# Patient Record
Sex: Female | Born: 1940 | Race: White | Hispanic: No | Marital: Married | Smoking: Never smoker
Health system: Southern US, Community
[De-identification: ages and names within clinical notes are randomized; demographics above are authoritative.]

## PROBLEM LIST (undated history)

## (undated) DIAGNOSIS — F419 Anxiety disorder, unspecified: Secondary | ICD-10-CM

## (undated) DIAGNOSIS — H269 Unspecified cataract: Secondary | ICD-10-CM

## (undated) DIAGNOSIS — T7840XA Allergy, unspecified, initial encounter: Secondary | ICD-10-CM

## (undated) DIAGNOSIS — E785 Hyperlipidemia, unspecified: Secondary | ICD-10-CM

## (undated) DIAGNOSIS — F329 Major depressive disorder, single episode, unspecified: Secondary | ICD-10-CM

## (undated) DIAGNOSIS — I10 Essential (primary) hypertension: Secondary | ICD-10-CM

## (undated) DIAGNOSIS — F32A Depression, unspecified: Secondary | ICD-10-CM

## (undated) HISTORY — DX: Major depressive disorder, single episode, unspecified: F32.9

## (undated) HISTORY — DX: Hyperlipidemia, unspecified: E78.5

## (undated) HISTORY — DX: Depression, unspecified: F32.A

## (undated) HISTORY — DX: Unspecified cataract: H26.9

## (undated) HISTORY — DX: Essential (primary) hypertension: I10

## (undated) HISTORY — DX: Anxiety disorder, unspecified: F41.9

## (undated) HISTORY — PX: DENTAL SURGERY: SHX609

## (undated) HISTORY — PX: APPENDECTOMY: SHX54

## (undated) HISTORY — DX: Allergy, unspecified, initial encounter: T78.40XA

## (undated) HISTORY — PX: BREAST SURGERY: SHX581

## (undated) HISTORY — PX: TONSILLECTOMY AND ADENOIDECTOMY: SUR1326

## (undated) HISTORY — PX: ABDOMINAL HYSTERECTOMY: SHX81

---

## 1998-02-16 ENCOUNTER — Other Ambulatory Visit: Admission: RE | Admit: 1998-02-16 | Discharge: 1998-02-16 | Payer: Self-pay | Admitting: Obstetrics and Gynecology

## 1998-02-21 ENCOUNTER — Other Ambulatory Visit: Admission: RE | Admit: 1998-02-21 | Discharge: 1998-02-21 | Payer: Self-pay | Admitting: Radiology

## 1998-03-20 ENCOUNTER — Other Ambulatory Visit: Admission: RE | Admit: 1998-03-20 | Discharge: 1998-03-20 | Payer: Self-pay

## 1999-02-19 ENCOUNTER — Other Ambulatory Visit: Admission: RE | Admit: 1999-02-19 | Discharge: 1999-02-19 | Payer: Self-pay | Admitting: Obstetrics and Gynecology

## 1999-08-01 ENCOUNTER — Encounter: Admission: RE | Admit: 1999-08-01 | Discharge: 1999-08-01 | Payer: Self-pay | Admitting: Internal Medicine

## 1999-08-01 ENCOUNTER — Encounter: Payer: Self-pay | Admitting: Internal Medicine

## 2000-02-25 ENCOUNTER — Other Ambulatory Visit: Admission: RE | Admit: 2000-02-25 | Discharge: 2000-02-25 | Payer: Self-pay | Admitting: Obstetrics and Gynecology

## 2001-03-02 ENCOUNTER — Other Ambulatory Visit: Admission: RE | Admit: 2001-03-02 | Discharge: 2001-03-02 | Payer: Self-pay | Admitting: Obstetrics and Gynecology

## 2002-01-18 ENCOUNTER — Ambulatory Visit (HOSPITAL_COMMUNITY): Admission: RE | Admit: 2002-01-18 | Discharge: 2002-01-18 | Payer: Self-pay | Admitting: Gastroenterology

## 2002-04-05 ENCOUNTER — Other Ambulatory Visit: Admission: RE | Admit: 2002-04-05 | Discharge: 2002-04-05 | Payer: Self-pay | Admitting: Obstetrics and Gynecology

## 2003-04-07 ENCOUNTER — Other Ambulatory Visit: Admission: RE | Admit: 2003-04-07 | Discharge: 2003-04-07 | Payer: Self-pay | Admitting: Obstetrics and Gynecology

## 2004-04-19 ENCOUNTER — Other Ambulatory Visit: Admission: RE | Admit: 2004-04-19 | Discharge: 2004-04-19 | Payer: Self-pay | Admitting: Obstetrics and Gynecology

## 2005-04-25 ENCOUNTER — Other Ambulatory Visit: Admission: RE | Admit: 2005-04-25 | Discharge: 2005-04-25 | Payer: Self-pay | Admitting: Obstetrics and Gynecology

## 2006-04-28 ENCOUNTER — Other Ambulatory Visit: Admission: RE | Admit: 2006-04-28 | Discharge: 2006-04-28 | Payer: Self-pay | Admitting: Obstetrics and Gynecology

## 2009-02-27 ENCOUNTER — Ambulatory Visit: Payer: Self-pay | Admitting: Surgery

## 2010-08-14 NOTE — Procedures (Signed)
CAROTID DUPLEX EXAM   INDICATION:  Carotid bruit, known tortuous vessels per patient that have  been followed for years.   HISTORY:  Diabetes:  No  Cardiac:  No  Hypertension:  No  Smoking:  No  Previous Surgery:  No  CV History:  Asymptomatic  Amaurosis Fugax No, Paresthesias No, Hemiparesis No                                       RIGHT             LEFT  Brachial systolic pressure:         154               158  Brachial Doppler waveforms:         WNL               WNL  Vertebral direction of flow:        Antegrade         Antegrade  DUPLEX VELOCITIES (cm/sec)  CCA peak systolic                   121               125  ECA peak systolic                   107               87  ICA peak systolic                   P=104, D=255      P=91, D=252  ICA end diastolic                   P=37, D=72        P=26, D=72  PLAQUE MORPHOLOGY:                  Homogenous  PLAQUE AMOUNT:                      Mild              None visualized  PLAQUE LOCATION:                    Bifurcation   IMPRESSION:  Bilateral internal carotid artery elevated velocities  mid/distal suggestive of 60%-79% stenosis, however, no plaque visualized  in these regions.  Significant tortuosity noted in bilateral mid/distal  internal carotid arteries which may be cause of elevated velocities.        ___________________________________________  V. Charlena Cross, MD   AS/MEDQ  D:  02/27/2009  T:  02/28/2009  Job:  [id number]

## 2010-08-17 NOTE — Op Note (Signed)
NAME:  Stephanie Aguilar, Stephanie Aguilar [facility]                 ACCOUNT NO.:  [id number]   MEDICAL RECORD NO.:  [id number]                   PATIENT TYPE:  AMB   LOCATION:  ENDO                                 FACILITY:  [facility]   PHYSICIAN:  Danise Edge, M.D.                DATE OF BIRTH:  [birth year]   DATE OF PROCEDURE:  01/18/2002  DATE OF DISCHARGE:                                 OPERATIVE REPORT   PROCEDURE:  Screening colonoscopy.   INDICATIONS FOR PROCEDURE:  [name] is a 70 year old female born  1940-04-19. [name] is scheduled to undergo her first screening  colonoscopy with polypectomy to prevent colon cancer. [name] father  underwent colonoscopic exams to remove colon polyps. [name]  intermittently passes blood on the toilet tissue if she wipes too  vigorously. Her stool Hemoccult cards performed through her gynecologist  have been negative throughout the years.   I discussed with [name] the complications associated with colonoscopy  and polypectomy including a 15/1000 risk of bleeding and 06/998 risk of  colon perforation requiring surgical repair. [name] has signed the  operative permit.   MEDICATION ALLERGIES:  SULFA DRUGS, ERYTHROMYCIN, DARVON, LATEX, CODEINE.   CHRONIC MEDICATIONS:  1. Remeron.  2. Lorazepam.  3. Estrogen.  4. Vitamin C.  5. Calcium with vitamin D.  6. Clarinex.  7. Flonase.  8. Advil.  9. Pravachol.   PAST MEDICAL HISTORY:  Hypercholesterolemia, rosacea.   PAST SURGICAL HISTORY:  Hysterectomy, breast fibroadenomas removed,  tonsillectomy, appendectomy.   HABITS:  [name] does not smoke cigarettes or consume alcohol.   ENDOSCOPIST:  Charolett Bumpers, M.D.   PREMEDICATION:  Versed 10 mg, fentanyl 100 mcg.   ENDOSCOPE:  Olympus pediatric colonoscope.   DESCRIPTION OF PROCEDURE:  After obtaining informed consent, [name]  was  placed in the left lateral decubitus position. I administered intravenous  Versed and intravenous fentanyl to achieve conscious sedation for the  procedure. The patient's blood pressure, oxygen saturation and cardiac  rhythm were monitored throughout the procedure and documented in the medical  record.   Anal inspection was normal. Digital rectal exam was normal. The Olympus  pediatric video colonoscope was introduced into the rectum and advanced to  the cecum. Colonic preparation for the exam today was excellent.   RECTUM:  Normal.   SIGMOID COLON AND DESCENDING COLON:  Normal.   SPLENIC FLEXURE:  Normal.   TRANSVERSE COLON:  Normal.   HEPATIC FLEXURE:  Normal.   ASCENDING COLON:  Normal.   CECUM AND ILEOCECAL VALVE:  Normal.   ASSESSMENT:  Normal screening proctocolonoscopy to the cecum. No endoscopic  evidence for the presence of colorectal neoplasia.  Danise Edge, M.D.    MJ/MEDQ  D:  01/18/2002  T:  01/18/2002  Job:  [id number]   cc:   Brett Canales A. Cleta Alberts, M.D.   Darius Bump, M.D.

## 2011-05-15 DIAGNOSIS — Z1231 Encounter for screening mammogram for malignant neoplasm of breast: Secondary | ICD-10-CM | POA: Diagnosis not present

## 2011-05-29 DIAGNOSIS — Z01419 Encounter for gynecological examination (general) (routine) without abnormal findings: Secondary | ICD-10-CM | POA: Diagnosis not present

## 2011-05-29 DIAGNOSIS — Z124 Encounter for screening for malignant neoplasm of cervix: Secondary | ICD-10-CM | POA: Diagnosis not present

## 2011-07-10 DIAGNOSIS — I10 Essential (primary) hypertension: Secondary | ICD-10-CM | POA: Diagnosis not present

## 2011-07-10 DIAGNOSIS — R0989 Other specified symptoms and signs involving the circulatory and respiratory systems: Secondary | ICD-10-CM | POA: Diagnosis not present

## 2011-07-10 DIAGNOSIS — E7800 Pure hypercholesterolemia, unspecified: Secondary | ICD-10-CM | POA: Diagnosis not present

## 2011-08-06 ENCOUNTER — Ambulatory Visit (INDEPENDENT_AMBULATORY_CARE_PROVIDER_SITE_OTHER): Payer: Medicare Other | Admitting: Emergency Medicine

## 2011-08-06 ENCOUNTER — Encounter: Payer: Self-pay | Admitting: Emergency Medicine

## 2011-08-06 VITALS — BP 128/66 | HR 57 | Temp 98.0°F | Resp 16 | Ht 59.5 in | Wt 114.0 lb

## 2011-08-06 DIAGNOSIS — I10 Essential (primary) hypertension: Secondary | ICD-10-CM

## 2011-08-06 DIAGNOSIS — E785 Hyperlipidemia, unspecified: Secondary | ICD-10-CM | POA: Diagnosis not present

## 2011-08-06 DIAGNOSIS — F411 Generalized anxiety disorder: Secondary | ICD-10-CM | POA: Diagnosis not present

## 2011-08-06 LAB — LIPID PANEL
HDL: 45 mg/dL (ref >39)
LDL Cholesterol: 67 mg/dL (ref 0–99)
Total CHOL/HDL Ratio: 3.3 Ratio
VLDL: 35 mg/dL (ref 0–40)

## 2011-08-06 LAB — CBC WITH DIFFERENTIAL/PLATELET
Basophils Absolute: 0.1 10*3/uL (ref 0.0–0.1)
Basophils Relative: 1 % (ref 0–1)
Eosinophils Absolute: 0.3 10*3/uL (ref 0.0–0.7)
Eosinophils Relative: 4 % (ref 0–5)
HCT: 43.2 % (ref 36.0–46.0)
Hemoglobin: 14.1 g/dL (ref 12.0–15.0)
Lymphocytes Relative: 25 % (ref 12–46)
Lymphs Abs: 1.6 10*3/uL (ref 0.7–4.0)
MCH: 30.3 pg (ref 26.0–34.0)
MCHC: 32.6 g/dL (ref 30.0–36.0)
MCV: 92.9 fL (ref 78.0–100.0)
Monocytes Absolute: 0.5 10*3/uL (ref 0.1–1.0)
Monocytes Relative: 8 % (ref 3–12)
Neutro Abs: 4.1 10*3/uL (ref 1.7–7.7)
Neutrophils Relative %: 62 % (ref 43–77)
Platelets: 178 10*3/uL (ref 150–400)
RBC: 4.65 MIL/uL (ref 3.87–5.11)
RDW: 13.2 % (ref 11.5–15.5)
WBC: 6.6 10*3/uL (ref 4.0–10.5)

## 2011-08-06 MED ORDER — VALSARTAN 320 MG PO TABS: 320.0000 mg | ORAL_TABLET | Freq: Every day | ORAL | Status: DC

## 2011-08-06 NOTE — Progress Notes (Signed)
  Subjective:    Patient ID: Stephanie Aguilar, female    DOB: 1940/08/21, 71 y.o.   MRN: [id number]  HPI patient enters for followup allergies high blood pressure and high cholesterol. She has just returned from a trip to the beach. He is not complaining of any chest pain shortness of breath or any other problems at the present time.    Review of Systems noncontributory except as relates to this illness.     Objective:   Physical Exam HEENT exam carotid bruits present which are unchanged from previous. Chest is clear heart regular rate no murmurs blood pressure is controlled .        Assessment & Plan:  Will go ahead and check her cholesterol. Diovan was refill recheck 6 months physical at that time.

## 2011-11-21 ENCOUNTER — Ambulatory Visit (INDEPENDENT_AMBULATORY_CARE_PROVIDER_SITE_OTHER): Payer: Medicare Other | Admitting: Emergency Medicine

## 2011-11-21 VITALS — BP 110/70 | HR 55 | Temp 98.1°F | Resp 16 | Ht 59.5 in | Wt 112.0 lb

## 2011-11-21 DIAGNOSIS — R197 Diarrhea, unspecified: Secondary | ICD-10-CM | POA: Diagnosis not present

## 2011-11-21 LAB — POCT CBC
Granulocyte percent: 70.1 %G (ref 37–80)
MCH, POC: 29.6 pg (ref 27–31.2)
MID (cbc): 0.8 (ref 0–0.9)
MPV: 9.7 fL (ref 0–99.8)
POC Granulocyte: 6.9 (ref 2–6.9)
POC MID %: 8.3 %M (ref 0–12)
Platelet Count, POC: 231 10*3/uL (ref 142–424)
RBC: 5 M/uL (ref 4.04–5.48)

## 2011-11-21 LAB — IFOBT (OCCULT BLOOD): IFOBT: NEGATIVE

## 2011-11-21 LAB — POCT SEDIMENTATION RATE: POCT SED RATE: 6 mm/hr (ref 0–22)

## 2011-11-21 MED ORDER — DICYCLOMINE HCL 20 MG PO TABS: 20.0000 mg | ORAL_TABLET | Freq: Three times a day (TID) | ORAL | Status: DC

## 2011-11-21 NOTE — Progress Notes (Signed)
  Subjective:    Patient ID: Stephanie Aguilar, female    DOB: 20-Aug-1940, 71 y.o.   MRN: [id number]  HPI Pt presents to clinic today with complaints of diarrhea for months/years. She thinks it may be related to her Pravachol which she has changed her dosages around a little bit. This has not seemed to make a difference. Pt also states the diarrhea may be related to stress. She has been busy this summer traveling and visiting friends and family.    Review of Systems     Objective:   Physical Exam is no scleral icterus. Neck is supple chest clear heart regular rate no murmurs abdomen soft nontender except for an area deep in the right lower abdomen.        Assessment & Plan:  I gave her some Bentyl she can try she did not want to take this medicine she could try Imodium A-D and modify her diet. She is going to see GI because she is due for her colonoscopy anyway. Her CBC and hemmosure were okay.

## 2011-11-21 NOTE — Patient Instructions (Addendum)
Diarrhea Infections caused by germs (bacterial) or a virus commonly cause diarrhea. Your caregiver has determined that with time, rest and fluids, the diarrhea should improve. In general, eat normally while drinking more water than usual. Although water may prevent dehydration, it does not contain salt and minerals (electrolytes). Broths, weak tea without caffeine and oral rehydration solutions (ORS) replace fluids and electrolytes. Small amounts of fluids should be taken frequently. Large amounts at one time may not be tolerated. Plain water may be harmful in infants and the elderly. Oral rehydrating solutions (ORS) are available at pharmacies and grocery stores. ORS replace water and important electrolytes in proper proportions. Sports drinks are not as effective as ORS and may be harmful due to sugars worsening diarrhea.  ORS is especially recommended for use in children with diarrhea. As a general guideline for children, replace any new fluid losses from diarrhea and/or vomiting with ORS as follows:   If your child weighs 22 pounds or under (10 kg or less), give 60-120 mL ( -  cup or 2 - 4 ounces) of ORS for each episode of diarrheal stool or vomiting episode.   If your child weighs more than 22 pounds (more than 10 kgs), give 120-240 mL ( - 1 cup or 4 - 8 ounces) of ORS for each diarrheal stool or episode of vomiting.   While correcting for dehydration, children should eat normally. However, foods high in sugar should be avoided because this may worsen diarrhea. Large amounts of carbonated soft drinks, juice, gelatin desserts and other highly sugared drinks should be avoided.   After correction of dehydration, other liquids that are appealing to the child may be added. Children should drink small amounts of fluids frequently and fluids should be increased as tolerated. Children should drink enough fluids to keep urine clear or pale yellow.   Adults should eat normally while drinking more fluids  than usual. Drink small amounts of fluids frequently and increase as tolerated. Drink enough fluids to keep urine clear or pale yellow. Broths, weak decaffeinated tea, lemon lime soft drinks (allowed to go flat) and ORS replace fluids and electrolytes.   Avoid:   Carbonated drinks.   Juice.   Extremely hot or cold fluids.   Caffeine drinks.   Fatty, greasy foods.   Alcohol.   Tobacco.   Too much intake of anything at one time.   Gelatin desserts.   Probiotics are active cultures of beneficial bacteria. They may lessen the amount and number of diarrheal stools in adults. Probiotics can be found in yogurt with active cultures and in supplements.   Wash hands well to avoid spreading bacteria and virus.   Anti-diarrheal medications are not recommended for infants and children.   Only take over-the-counter or prescription medicines for pain, discomfort or fever as directed by your caregiver. Do not give aspirin to children because it may cause Reye's Syndrome.   For adults, ask your caregiver if you should continue all prescribed and over-the-counter medicines.   If your caregiver has given you a follow-up appointment, it is very important to keep that appointment. Not keeping the appointment could result in a chronic or permanent injury, and disability. If there is any problem keeping the appointment, you must call back to this facility for assistance.  SEEK IMMEDIATE MEDICAL CARE IF:   You or your child is unable to keep fluids down or other symptoms or problems become worse in spite of treatment.   Vomiting or diarrhea develops and becomes persistent.     There is vomiting of blood or bile (green material).   There is blood in the stool or the stools are black and tarry.   There is no urine output in 6-8 hours or there is only a small amount of very dark urine.   Abdominal pain develops, increases or localizes.   You have a fever.   Your baby is older than 3 months with a  rectal temperature of 102 F (38.9 C) or higher.   Your baby is 3 months old or younger with a rectal temperature of 100.4 F (38 C) or higher.   You or your child develops excessive weakness, dizziness, fainting or extreme thirst.   You or your child develops a rash, stiff neck, severe headache or become irritable or sleepy and difficult to awaken.  MAKE SURE YOU:   Understand these instructions.   Will watch your condition.   Will get help right away if you are not doing well or get worse.  Document Released: 03/08/2002 Document Revised: 03/07/2011 Document Reviewed: 01/23/2009 [employer] Patient Information 2012 [employer]. 

## 2011-11-22 LAB — GLIA (IGA/G) + TTG IGA: Tissue Transglutaminase Ab, IgA: 9.6 U/mL (ref <20)

## 2011-11-23 ENCOUNTER — Other Ambulatory Visit: Payer: Self-pay | Admitting: Emergency Medicine

## 2011-12-10 ENCOUNTER — Telehealth: Payer: Self-pay

## 2011-12-10 DIAGNOSIS — R197 Diarrhea, unspecified: Secondary | ICD-10-CM | POA: Insufficient documentation

## 2011-12-19 ENCOUNTER — Ambulatory Visit (INDEPENDENT_AMBULATORY_CARE_PROVIDER_SITE_OTHER): Payer: Medicare Other

## 2011-12-19 DIAGNOSIS — Z23 Encounter for immunization: Secondary | ICD-10-CM | POA: Diagnosis not present

## 2012-01-01 DIAGNOSIS — D126 Benign neoplasm of colon, unspecified: Secondary | ICD-10-CM | POA: Diagnosis not present

## 2012-01-01 DIAGNOSIS — R197 Diarrhea, unspecified: Secondary | ICD-10-CM | POA: Diagnosis not present

## 2012-01-10 ENCOUNTER — Encounter: Payer: Self-pay | Admitting: Family Medicine

## 2012-01-10 DIAGNOSIS — R197 Diarrhea, unspecified: Secondary | ICD-10-CM

## 2012-01-15 DIAGNOSIS — H2510 Age-related nuclear cataract, unspecified eye: Secondary | ICD-10-CM | POA: Diagnosis not present

## 2012-01-15 DIAGNOSIS — H04129 Dry eye syndrome of unspecified lacrimal gland: Secondary | ICD-10-CM | POA: Diagnosis not present

## 2012-02-04 ENCOUNTER — Encounter: Payer: Self-pay | Admitting: Emergency Medicine

## 2012-02-04 ENCOUNTER — Ambulatory Visit (INDEPENDENT_AMBULATORY_CARE_PROVIDER_SITE_OTHER): Payer: Medicare Other | Admitting: Emergency Medicine

## 2012-02-04 VITALS — BP 116/100 | HR 61 | Temp 97.8°F | Resp 12 | Ht 59.5 in | Wt 109.2 lb

## 2012-02-04 DIAGNOSIS — R197 Diarrhea, unspecified: Secondary | ICD-10-CM

## 2012-02-04 DIAGNOSIS — L639 Alopecia areata, unspecified: Secondary | ICD-10-CM

## 2012-02-04 DIAGNOSIS — E785 Hyperlipidemia, unspecified: Secondary | ICD-10-CM | POA: Insufficient documentation

## 2012-02-04 DIAGNOSIS — E789 Disorder of lipoprotein metabolism, unspecified: Secondary | ICD-10-CM

## 2012-02-04 DIAGNOSIS — L659 Nonscarring hair loss, unspecified: Secondary | ICD-10-CM | POA: Diagnosis not present

## 2012-02-04 DIAGNOSIS — Z79890 Hormone replacement therapy (postmenopausal): Secondary | ICD-10-CM | POA: Insufficient documentation

## 2012-02-04 DIAGNOSIS — I10 Essential (primary) hypertension: Secondary | ICD-10-CM | POA: Diagnosis not present

## 2012-02-04 DIAGNOSIS — N951 Menopausal and female climacteric states: Secondary | ICD-10-CM

## 2012-02-04 LAB — LIPID PANEL
LDL Cholesterol: 132 mg/dL — ABNORMAL HIGH (ref 0–99)
VLDL: 59 mg/dL — ABNORMAL HIGH (ref 0–40)

## 2012-02-04 LAB — T4, FREE: Free T4: 0.96 ng/dL (ref 0.80–1.80)

## 2012-02-04 MED ORDER — CLOBETASOL PROPIONATE 0.05 % EX LOTN: 5.0000 [drp] | TOPICAL_LOTION | Freq: Two times a day (BID) | CUTANEOUS | Status: DC

## 2012-02-04 MED ORDER — CILIDINIUM-CHLORDIAZEPOXIDE 2.5-5 MG PO CAPS: 1.0000 | ORAL_CAPSULE | Freq: Three times a day (TID) | ORAL | Status: DC | PRN

## 2012-02-04 NOTE — Progress Notes (Signed)
  Subjective:    Patient ID: Stephanie Aguilar, female    DOB: 1940/05/06, 71 y.o.   MRN: [id number]  HPI problem #1 diarrhea. Patient has been to the GI specialist and had colonoscopy which was completely normal. Patient referred back here for treatment. She feels like her diarrhea comes when she is under a lot of stress. She feels she does have some degree of lactose intolerance. She does use Lactaid at times. Her testing for celiac disease was negative.  Problem #2 is hyperlipidemia. She's currently off her Pravachol her last cholesterol being 147. She would prefer to stay off this medication.  Problem #3 is alopecia. She had an area of crusting on the top of her scalp which she used steroid drops for with improvement. She would like a refill of this medication.    Review of Systems     Objective:   Physical Exam there is a small patch of hair loss on the top of her head with some early hair follicle formation. Her thyroid is normal his size without masses her chest was clear. Cardiac exam was a regular rate without murmurs. The abdomen was soft with minimal right lower abdominal discomfort and        Assessment & Plan:  I've asked the patient to try Citrucel as a bowel normalizer. If this is unsuccessful she will try Librax . She does feel there is an anxiety component to her diarrhea. I sent a prescription for clobetasol propionate to use for her patch of hair loss on scalp. I have checked a cholesterol as well as thyroid panel. She is up-to-date on her flu shot. Her blood pressure was up this visit but she has been having normal blood pressures at home. She will continue to monitor this at home and let me know if she develops persistent high blood pressure elevations.

## 2012-02-07 MED ORDER — ROSUVASTATIN CALCIUM 5 MG PO TABS: 5.0000 mg | ORAL_TABLET | Freq: Every day | ORAL | Status: DC

## 2012-02-07 NOTE — Telephone Encounter (Signed)
Crestor ordered for pt - see notes under lab results

## 2012-02-09 ENCOUNTER — Other Ambulatory Visit: Payer: Self-pay | Admitting: Physician Assistant

## 2012-02-09 DIAGNOSIS — L659 Nonscarring hair loss, unspecified: Secondary | ICD-10-CM

## 2012-02-09 MED ORDER — CLOBETASOL PROPIONATE 0.05 % EX LOTN: 5.0000 [drp] | TOPICAL_LOTION | Freq: Two times a day (BID) | CUTANEOUS | Status: DC

## 2012-02-18 ENCOUNTER — Other Ambulatory Visit: Payer: Self-pay | Admitting: Emergency Medicine

## 2012-04-01 ENCOUNTER — Other Ambulatory Visit: Payer: Self-pay | Admitting: Physician Assistant

## 2012-05-02 ENCOUNTER — Other Ambulatory Visit: Payer: Self-pay | Admitting: Emergency Medicine

## 2012-05-05 ENCOUNTER — Encounter: Payer: Self-pay | Admitting: Emergency Medicine

## 2012-05-05 ENCOUNTER — Ambulatory Visit (INDEPENDENT_AMBULATORY_CARE_PROVIDER_SITE_OTHER): Payer: Medicare Other | Admitting: Emergency Medicine

## 2012-05-05 VITALS — BP 162/73 | HR 54 | Temp 97.7°F | Resp 17 | Wt 114.0 lb

## 2012-05-05 DIAGNOSIS — I10 Essential (primary) hypertension: Secondary | ICD-10-CM

## 2012-05-05 DIAGNOSIS — Z79899 Other long term (current) drug therapy: Secondary | ICD-10-CM | POA: Diagnosis not present

## 2012-05-05 DIAGNOSIS — M810 Age-related osteoporosis without current pathological fracture: Secondary | ICD-10-CM

## 2012-05-05 DIAGNOSIS — E559 Vitamin D deficiency, unspecified: Secondary | ICD-10-CM

## 2012-05-05 DIAGNOSIS — E785 Hyperlipidemia, unspecified: Secondary | ICD-10-CM | POA: Diagnosis not present

## 2012-05-05 DIAGNOSIS — R197 Diarrhea, unspecified: Secondary | ICD-10-CM

## 2012-05-05 LAB — COMPREHENSIVE METABOLIC PANEL
ALT: 20 U/L (ref 0–35)
AST: 20 U/L (ref 0–37)
Chloride: 106 mEq/L (ref 96–112)
Creat: 0.8 mg/dL (ref 0.50–1.10)
Total Bilirubin: 0.5 mg/dL (ref 0.3–1.2)

## 2012-05-05 LAB — LIPID PANEL
HDL: 59 mg/dL (ref >39)
LDL Cholesterol: 46 mg/dL (ref 0–99)
Total CHOL/HDL Ratio: 2.2 Ratio

## 2012-05-05 LAB — CBC WITH DIFFERENTIAL/PLATELET
Basophils Absolute: 0.1 10*3/uL (ref 0.0–0.1)
Eosinophils Absolute: 0.2 10*3/uL (ref 0.0–0.7)
Eosinophils Relative: 3 % (ref 0–5)
Lymphocytes Relative: 24 % (ref 12–46)
MCV: 89.1 fL (ref 78.0–100.0)
Platelets: 181 10*3/uL (ref 150–400)
RDW: 13.6 % (ref 11.5–15.5)
WBC: 7.2 10*3/uL (ref 4.0–10.5)

## 2012-05-05 MED ORDER — ROSUVASTATIN CALCIUM 5 MG PO TABS: 5.0000 mg | ORAL_TABLET | Freq: Every day | ORAL | Status: DC

## 2012-05-05 MED ORDER — CILIDINIUM-CHLORDIAZEPOXIDE 2.5-5 MG PO CAPS: 1.0000 | ORAL_CAPSULE | Freq: Three times a day (TID) | ORAL | Status: DC | PRN

## 2012-05-05 NOTE — Progress Notes (Signed)
  Subjective:    Patient ID: Delos Haring, female    DOB: 06/02/1940, 72 y.o.   MRN: [id number]  HPI problem #1 is diarrhea. She continues to have problems with frequent loose stools. She underwent a colonoscopy with normal findings. She states that when she is under stress she has more problems with her diarrhea. Does seem to do better with the Librax she takes the librium problem #2 is a dark spot on her nose this has been increasing in size and like it checked. Problem #3 is hyperlipidemia. She's currently on Crestor and tolerating this without difficulty. Problem #4 is hypertension. She's currently on medication for this and doing well.   Review of Systems     Objective:   Physical Exam HEENT exam is unremarkable. Examination of the left side of the nose reveals a flat area with some mild increase in brownish pigmentation but no dark pigment. The neck is supple chest clear to auscultation and percussion cardiac exam is regular rate without murmurs abdomen is soft liver and spleen not large without masses.        Assessment & Plan:  Medical problems in the be stable at present. Her weight is up 5 pounds so I do not feel the diarrhea is a sign of malabsorption. We'll check a routine labs including vitamin D. We'll try and schedule a bone density study at that time she has her mammogram

## 2012-05-06 ENCOUNTER — Other Ambulatory Visit: Payer: Self-pay

## 2012-05-06 DIAGNOSIS — I10 Essential (primary) hypertension: Secondary | ICD-10-CM

## 2012-05-06 DIAGNOSIS — Z139 Encounter for screening, unspecified: Secondary | ICD-10-CM

## 2012-05-06 DIAGNOSIS — E785 Hyperlipidemia, unspecified: Secondary | ICD-10-CM

## 2012-05-06 DIAGNOSIS — E559 Vitamin D deficiency, unspecified: Secondary | ICD-10-CM

## 2012-05-06 DIAGNOSIS — M810 Age-related osteoporosis without current pathological fracture: Secondary | ICD-10-CM

## 2012-05-06 NOTE — Progress Notes (Signed)
Stephanie Aguilar, pt wants bone density done at [facility]. Is there anyway we can have [facility] call pt to make the appt because she already has a mammogram appt and wants to set these 2 tests up to be done on the same day. Thanks

## 2012-05-16 ENCOUNTER — Other Ambulatory Visit: Payer: Self-pay | Admitting: Physician Assistant

## 2012-05-16 ENCOUNTER — Other Ambulatory Visit: Payer: Self-pay | Admitting: Emergency Medicine

## 2012-05-18 ENCOUNTER — Telehealth: Payer: Self-pay | Admitting: *Deleted

## 2012-05-18 MED ORDER — MOMETASONE FUROATE 50 MCG/ACT NA SUSP: 2.0000 | Freq: Every day | NASAL | Status: DC

## 2012-05-18 NOTE — Telephone Encounter (Signed)
Rx changed and sent pharmacy 

## 2012-05-18 NOTE — Telephone Encounter (Signed)
[facility] states that pt would like to see if we could change Flonase to Nasonex so that it will be covered by her insurance.

## 2012-05-20 DIAGNOSIS — Z13820 Encounter for screening for osteoporosis: Secondary | ICD-10-CM | POA: Diagnosis not present

## 2012-05-20 DIAGNOSIS — Z1231 Encounter for screening mammogram for malignant neoplasm of breast: Secondary | ICD-10-CM | POA: Diagnosis not present

## 2012-05-26 ENCOUNTER — Telehealth: Payer: Self-pay | Admitting: Family Medicine

## 2012-05-26 NOTE — Telephone Encounter (Signed)
Notified Pt that Bone Density Scan done 05/20/12/ has no changes compared to the one did in 05/2009. Pt voiced understanding and also told me her mammogram done this month was normal. Eileen Stanford

## 2012-05-26 NOTE — Telephone Encounter (Signed)
Notified Pt that Bone Density Scan done 05/20/12

## 2012-05-29 ENCOUNTER — Other Ambulatory Visit: Payer: Self-pay | Admitting: Emergency Medicine

## 2012-05-31 ENCOUNTER — Other Ambulatory Visit: Payer: Self-pay | Admitting: Emergency Medicine

## 2012-06-16 ENCOUNTER — Encounter: Payer: Self-pay | Admitting: Emergency Medicine

## 2012-07-02 ENCOUNTER — Encounter: Payer: Self-pay | Admitting: Emergency Medicine

## 2012-07-08 DIAGNOSIS — Z01419 Encounter for gynecological examination (general) (routine) without abnormal findings: Secondary | ICD-10-CM | POA: Diagnosis not present

## 2012-07-08 DIAGNOSIS — Z124 Encounter for screening for malignant neoplasm of cervix: Secondary | ICD-10-CM | POA: Diagnosis not present

## 2012-07-13 ENCOUNTER — Telehealth: Payer: Self-pay

## 2012-07-13 DIAGNOSIS — R197 Diarrhea, unspecified: Secondary | ICD-10-CM

## 2012-07-13 NOTE — Telephone Encounter (Signed)
Pharm requests RF of Librax 5-2.5 mg.

## 2012-07-14 NOTE — Telephone Encounter (Signed)
This is okay refill this prescription and 5 refills

## 2012-07-15 MED ORDER — CILIDINIUM-CHLORDIAZEPOXIDE 2.5-5 MG PO CAPS: 1.0000 | ORAL_CAPSULE | Freq: Three times a day (TID) | ORAL | Status: DC | PRN

## 2012-07-15 NOTE — Telephone Encounter (Signed)
Called in Rx

## 2012-07-16 ENCOUNTER — Other Ambulatory Visit: Payer: Self-pay

## 2012-07-16 MED ORDER — CLOBETASOL PROPIONATE 0.05 % EX SOLN: 1.0000 "application " | Freq: Two times a day (BID) | CUTANEOUS | Status: DC

## 2012-07-21 ENCOUNTER — Other Ambulatory Visit: Payer: Self-pay | Admitting: Physician Assistant

## 2012-07-30 ENCOUNTER — Telehealth: Payer: Self-pay

## 2012-07-30 NOTE — Telephone Encounter (Signed)
Pharmacy requests Rf of pt's metronidazole cream. Dr Cleta Alberts, do you want to RF this or does pt need re-eval?

## 2012-07-31 MED ORDER — METRONIDAZOLE 0.75 % EX LOTN: TOPICAL_LOTION | CUTANEOUS | Status: DC

## 2012-07-31 NOTE — Telephone Encounter (Signed)
If okay to refill the medication one time. If her symptoms persist after this I will need to recheck her.

## 2012-07-31 NOTE — Telephone Encounter (Signed)
Sent in Rx w/note to RTC if sxs persist.

## 2012-08-13 ENCOUNTER — Other Ambulatory Visit: Payer: Self-pay

## 2012-08-13 MED ORDER — DESLORATADINE 5 MG PO TABS: 5.0000 mg | ORAL_TABLET | Freq: Every day | ORAL | Status: DC

## 2012-09-01 ENCOUNTER — Encounter: Payer: Self-pay | Admitting: Emergency Medicine

## 2012-09-01 ENCOUNTER — Ambulatory Visit (INDEPENDENT_AMBULATORY_CARE_PROVIDER_SITE_OTHER): Payer: Medicare Other | Admitting: Emergency Medicine

## 2012-09-01 VITALS — BP 156/84 | HR 61 | Temp 98.2°F | Resp 16 | Ht 59.0 in | Wt 108.4 lb

## 2012-09-01 DIAGNOSIS — I10 Essential (primary) hypertension: Secondary | ICD-10-CM | POA: Diagnosis not present

## 2012-09-01 DIAGNOSIS — E785 Hyperlipidemia, unspecified: Secondary | ICD-10-CM

## 2012-09-01 DIAGNOSIS — R197 Diarrhea, unspecified: Secondary | ICD-10-CM | POA: Diagnosis not present

## 2012-09-01 LAB — LIPID PANEL
Cholesterol: 126 mg/dL (ref 0–200)
Total CHOL/HDL Ratio: 2.4 Ratio
Triglycerides: 145 mg/dL (ref <150)
VLDL: 29 mg/dL (ref 0–40)

## 2012-09-01 LAB — COMPREHENSIVE METABOLIC PANEL
ALT: 13 U/L (ref 0–35)
Alkaline Phosphatase: 64 U/L (ref 39–117)
CO2: 24 mEq/L (ref 19–32)
Creat: 1.04 mg/dL (ref 0.50–1.10)
Total Bilirubin: 0.6 mg/dL (ref 0.3–1.2)

## 2012-09-01 MED ORDER — METOPROLOL SUCCINATE ER 25 MG PO TB24: ORAL_TABLET | ORAL | Status: DC

## 2012-09-01 MED ORDER — FLUTICASONE PROPIONATE 50 MCG/ACT NA SUSP: 2.0000 | Freq: Every day | NASAL | Status: DC

## 2012-09-01 MED ORDER — VALSARTAN 320 MG PO TABS: ORAL_TABLET | ORAL | Status: DC

## 2012-09-01 MED ORDER — METRONIDAZOLE 0.75 % EX LOTN: TOPICAL_LOTION | CUTANEOUS | Status: DC

## 2012-09-01 NOTE — Progress Notes (Signed)
  Subjective:    Patient ID: Stephanie Aguilar, female    DOB: 03/04/1941, 72 y.o.   MRN: [id number]  HPI patient here to followup on hypertension hyperlipidemia. She also has chronic diarrhea. She is very careful about her diet. She denies chest pain shortness of breath.    Review of Systems     Objective:   Physical Exam patient is alert and cooperative not in any distress. Her chest is clear. Heart regular rate no murmurs. Abdomen soft nontender        Assessment & Plan:  Patient doing well on regular medication regimen. The blood pressure she gets at home are better than we have here in the office so we'll not make any changes. Her last lipid was very much in range if it is doing well consider decrease Crestor to a half tablet a day

## 2012-11-04 ENCOUNTER — Other Ambulatory Visit: Payer: Self-pay

## 2012-11-05 ENCOUNTER — Other Ambulatory Visit: Payer: Self-pay | Admitting: Physician Assistant

## 2012-11-06 ENCOUNTER — Other Ambulatory Visit: Payer: Self-pay | Admitting: Emergency Medicine

## 2012-12-22 ENCOUNTER — Ambulatory Visit (INDEPENDENT_AMBULATORY_CARE_PROVIDER_SITE_OTHER): Payer: Medicare Other | Admitting: *Deleted

## 2012-12-22 DIAGNOSIS — Z23 Encounter for immunization: Secondary | ICD-10-CM

## 2013-01-25 ENCOUNTER — Other Ambulatory Visit: Payer: Self-pay | Admitting: Emergency Medicine

## 2013-02-05 ENCOUNTER — Other Ambulatory Visit: Payer: Self-pay | Admitting: Emergency Medicine

## 2013-02-10 ENCOUNTER — Other Ambulatory Visit: Payer: Self-pay | Admitting: Emergency Medicine

## 2013-02-16 ENCOUNTER — Encounter: Payer: Self-pay | Admitting: Emergency Medicine

## 2013-02-16 ENCOUNTER — Ambulatory Visit (INDEPENDENT_AMBULATORY_CARE_PROVIDER_SITE_OTHER): Payer: Medicare Other | Admitting: Emergency Medicine

## 2013-02-16 VITALS — BP 150/68 | HR 81 | Temp 99.3°F | Resp 16 | Ht 59.25 in | Wt 110.8 lb

## 2013-02-16 DIAGNOSIS — R52 Pain, unspecified: Secondary | ICD-10-CM

## 2013-02-16 DIAGNOSIS — L659 Nonscarring hair loss, unspecified: Secondary | ICD-10-CM | POA: Diagnosis not present

## 2013-02-16 DIAGNOSIS — E785 Hyperlipidemia, unspecified: Secondary | ICD-10-CM

## 2013-02-16 DIAGNOSIS — R509 Fever, unspecified: Secondary | ICD-10-CM

## 2013-02-16 DIAGNOSIS — J029 Acute pharyngitis, unspecified: Secondary | ICD-10-CM

## 2013-02-16 LAB — LIPID PANEL
Cholesterol: 136 mg/dL (ref 0–200)
HDL: 41 mg/dL (ref >39)
Triglycerides: 242 mg/dL — ABNORMAL HIGH (ref <150)

## 2013-02-16 LAB — T4, FREE: Free T4: 0.82 ng/dL (ref 0.80–1.80)

## 2013-02-16 LAB — POCT CBC
Granulocyte percent: 82.2 %G — AB (ref 37–80)
HCT, POC: 47.8 % (ref 37.7–47.9)
Hemoglobin: 14.9 g/dL (ref 12.2–16.2)
MCV: 96.9 fL (ref 80–97)
RBC: 4.93 M/uL (ref 4.04–5.48)
WBC: 6.8 10*3/uL (ref 4.6–10.2)

## 2013-02-16 LAB — POCT INFLUENZA A/B: Influenza B, POC: NEGATIVE

## 2013-02-16 LAB — POCT RAPID STREP A (OFFICE): Rapid Strep A Screen: NEGATIVE

## 2013-02-16 NOTE — Progress Notes (Signed)
  Subjective:    Patient ID: Stephanie Aguilar, female    DOB: [birth year], 72 y.o.   MRN: [id number]  HPI patient started feeling bad last Thursday with a mild sore throat. Over the weekend she did not feel too bad but yesterday developed severe myalgias associated with fever and a dry cough. She feels as though she may have the flu. She did take a flu shot in September. She is not coughing up any colored phlegm she denies significant sore throat. She's also concerned about recent hair loss. She is worried about her thyroid.    Review of Systems     Objective:   Physical Exam patient is alert and cooperative she is in no distress. Her neck is supple. Her chest is clear to both auscultation and percussion. Heart is a regular rate without murmurs rubs or gallops. Abdomen is soft liver and spleen not enlarged there are no areas of tenderness no masses. Results for orders placed in visit on 02/16/13  POCT CBC      Result Value Range   WBC 6.8  4.6 - 10.2 K/uL   Lymph, poc 0.8  0.6 - 3.4   POC LYMPH PERCENT 12.1  10 - 50 %L   MID (cbc) 0.4  0 - 0.9   POC MID % 5.7  0 - 12 %M   POC Granulocyte 5.6  2 - 6.9   Granulocyte percent 82.2 (*) 37 - 80 %G   RBC 4.93  4.04 - 5.48 M/uL   Hemoglobin 14.9  12.2 - 16.2 g/dL   HCT, POC 96.2  95.2 - 47.9 %   MCV 96.9  80 - 97 fL   MCH, POC 30.2  27 - 31.2 pg   MCHC 31.2 (*) 31.8 - 35.4 g/dL   RDW, POC 84.1     Platelet Count, POC 161  142 - 424 K/uL   MPV 9.6  0 - 99.8 fL  POCT INFLUENZA A/B      Result Value Range   Influenza A, POC Negative     Influenza B, POC Negative    POCT RAPID STREP A (OFFICE)      Result Value Range   Rapid Strep A Screen Negative  Negative         Assessment & Plan:  We'll go ahead and check a CBC test and strep test. I did go ahead and send off her lipid panel because she does have hyperlipidemia as well as check her thyroid studies tests are all negative. We'll just treat symptomatically at present with Advil  fluids rest she will return to clinic in 2 weeks for her physical ..

## 2013-03-02 ENCOUNTER — Telehealth: Payer: Self-pay | Admitting: *Deleted

## 2013-03-02 ENCOUNTER — Ambulatory Visit (INDEPENDENT_AMBULATORY_CARE_PROVIDER_SITE_OTHER): Payer: Medicare Other | Admitting: Emergency Medicine

## 2013-03-02 ENCOUNTER — Encounter: Payer: Self-pay | Admitting: Emergency Medicine

## 2013-03-02 VITALS — BP 170/80 | HR 65 | Temp 98.7°F | Resp 16 | Ht 59.25 in | Wt 111.0 lb

## 2013-03-02 DIAGNOSIS — L659 Nonscarring hair loss, unspecified: Secondary | ICD-10-CM

## 2013-03-02 DIAGNOSIS — Z139 Encounter for screening, unspecified: Secondary | ICD-10-CM

## 2013-03-02 DIAGNOSIS — E785 Hyperlipidemia, unspecified: Secondary | ICD-10-CM

## 2013-03-02 DIAGNOSIS — E559 Vitamin D deficiency, unspecified: Secondary | ICD-10-CM | POA: Diagnosis not present

## 2013-03-02 DIAGNOSIS — Z0000 Encounter for general adult medical examination without abnormal findings: Secondary | ICD-10-CM

## 2013-03-02 DIAGNOSIS — I10 Essential (primary) hypertension: Secondary | ICD-10-CM

## 2013-03-02 LAB — COMPREHENSIVE METABOLIC PANEL WITH GFR
ALT: 9 U/L (ref 0–35)
AST: 15 U/L (ref 0–37)
Albumin: 3.9 g/dL (ref 3.5–5.2)
Alkaline Phosphatase: 64 U/L (ref 39–117)
BUN: 18 mg/dL (ref 6–23)
CO2: 27 meq/L (ref 19–32)
Calcium: 9.4 mg/dL (ref 8.4–10.5)
Chloride: 105 meq/L (ref 96–112)
Creat: 0.94 mg/dL (ref 0.50–1.10)
Glucose, Bld: 91 mg/dL (ref 70–99)
Potassium: 4.1 meq/L (ref 3.5–5.3)
Sodium: 139 meq/L (ref 135–145)
Total Bilirubin: 0.4 mg/dL (ref 0.3–1.2)
Total Protein: 6.6 g/dL (ref 6.0–8.3)

## 2013-03-02 LAB — POCT URINALYSIS DIPSTICK
Ketones, UA: NEGATIVE
Leukocytes, UA: NEGATIVE
Protein, UA: NEGATIVE
Urobilinogen, UA: 0.2

## 2013-03-02 LAB — POCT UA - MICROSCOPIC ONLY
Casts, Ur, LPF, POC: NEGATIVE
Crystals, Ur, HPF, POC: NEGATIVE
Mucus, UA: POSITIVE
Yeast, UA: NEGATIVE

## 2013-03-02 MED ORDER — LORAZEPAM 0.5 MG PO TABS: ORAL_TABLET | ORAL | Status: DC

## 2013-03-02 MED ORDER — ROSUVASTATIN CALCIUM 5 MG PO TABS: ORAL_TABLET | ORAL | Status: DC

## 2013-03-02 MED ORDER — METOPROLOL TARTRATE 25 MG PO TABS: 25.0000 mg | ORAL_TABLET | Freq: Two times a day (BID) | ORAL | Status: DC

## 2013-03-02 NOTE — Telephone Encounter (Signed)
Copy of EKG was faxed to Dr Jacinto Halim, patient will see him in a week or so. The referral is Non-Emergent, per Dr Cleta Alberts.

## 2013-03-02 NOTE — Telephone Encounter (Signed)
Patient returned call and she wants the Ativan prescription faxed to [facility] at [address]. I advised Dr Cleta Alberts and Rx was faxed at 3:57 pm

## 2013-03-02 NOTE — Progress Notes (Signed)
   Subjective:    Patient ID: Stephanie Aguilar, female    DOB: 18-Jan-1941, 72 y.o.   MRN: [id number]  HPI 72 year old woman presents to clinic today for her CPE. Today she is concerned about hair loss, blood pressure, and IBS. We checked her Thyroid and Lipids in November 2014. She sees her gyno every year- she had a complete hysterectomy 19 years ago. She is on hormone replacement and understands the risks/benefits of using them. She has her mammograms every year. Last colonoscopy was October 2013. Pt was evaluated and released by Dr. Jacinto Halim. She has known bruits.   Review of Systems she is currently recovering from a sinus infection. She has been treating this with him that he caught with good results. When she presented here for her physical 2 weeks ago she was ill with flu symptoms she was examined and evaluated for flu and told to come in today for the completion of her physical.     Objective:   Physical Exam H. EENT exam. Pupils equal reactive to light TMs clear nose normal posterior pharynx is clear neck exam reveals no adenopathy there are bilateral carotid bruits but worse on the right. Cardiac exam is regular rate and rhythm there is a 2/6 systolic murmur at the left sternal border chest is clear to both auscultation and percussion. Breast exam is within normal limits without masses nipple discharge or skin changes. The abdomen is soft liver spleen not enlarged there are no areas of tenderness .        Assessment & Plan:  She is up-to-date on all immunizations. Her blood pressure runs on the high side systolic but we'll not make any changes at the present time. She has bilateral carotid bruits but normal carotid studies. She has been thoroughly evaluated by Dr. Darrick Huntsman 2 years ago with normal findings. She sees her GYN doctor every year. She is on estrogen patch but is very compliant with her yearly breast checks. Her blood pressure appears very labile. She has been on medical  succinate twice a day. I have changed her to metoprolol tartrate 25 mg twice a day. Appointment made to see Dr.Ganji to help evaluate her labile hypertension and evaluate for fibromuscular dysplasia .

## 2013-03-02 NOTE — Telephone Encounter (Signed)
Called patient left message to return call, she did not receive prescription and AVS.

## 2013-03-02 NOTE — Progress Notes (Deleted)
   Subjective:    Patient ID: Stephanie Aguilar, female    DOB: 12-Feb-1941, 72 y.o.   MRN: [id number]  HPI    Review of Systems  Constitutional: Negative.   HENT: Negative.   Eyes: Negative.   Respiratory: Negative.   Cardiovascular: Negative.   Gastrointestinal: Positive for diarrhea.       IBS-lactose intolerant  Endocrine: Negative.   Genitourinary: Negative.   Musculoskeletal: Negative.   Skin: Negative.   Allergic/Immunologic: Positive for environmental allergies.  Neurological: Negative.   Hematological: Negative.   Psychiatric/Behavioral: Negative.        Objective:   Physical Exam        Assessment & Plan:

## 2013-03-03 LAB — VITAMIN D 25 HYDROXY (VIT D DEFICIENCY, FRACTURES): Vit D, 25-Hydroxy: 40 ng/mL (ref 30–89)

## 2013-03-09 LAB — IFOBT (OCCULT BLOOD): IFOBT: NEGATIVE

## 2013-03-16 ENCOUNTER — Encounter: Payer: Medicare Other | Admitting: Emergency Medicine

## 2013-03-23 ENCOUNTER — Other Ambulatory Visit: Payer: Self-pay | Admitting: Emergency Medicine

## 2013-04-08 DIAGNOSIS — I10 Essential (primary) hypertension: Secondary | ICD-10-CM | POA: Diagnosis not present

## 2013-04-08 DIAGNOSIS — E783 Hyperchylomicronemia: Secondary | ICD-10-CM | POA: Diagnosis not present

## 2013-04-08 DIAGNOSIS — R079 Chest pain, unspecified: Secondary | ICD-10-CM | POA: Diagnosis not present

## 2013-04-08 DIAGNOSIS — I7789 Other specified disorders of arteries and arterioles: Secondary | ICD-10-CM | POA: Diagnosis not present

## 2013-04-26 DIAGNOSIS — R0989 Other specified symptoms and signs involving the circulatory and respiratory systems: Secondary | ICD-10-CM | POA: Diagnosis not present

## 2013-04-29 DIAGNOSIS — R9431 Abnormal electrocardiogram [ECG] [EKG]: Secondary | ICD-10-CM | POA: Diagnosis not present

## 2013-04-29 DIAGNOSIS — I10 Essential (primary) hypertension: Secondary | ICD-10-CM | POA: Diagnosis not present

## 2013-05-03 ENCOUNTER — Other Ambulatory Visit: Payer: Self-pay | Admitting: Physician Assistant

## 2013-05-11 DIAGNOSIS — R0989 Other specified symptoms and signs involving the circulatory and respiratory systems: Secondary | ICD-10-CM | POA: Diagnosis not present

## 2013-05-11 DIAGNOSIS — I10 Essential (primary) hypertension: Secondary | ICD-10-CM | POA: Diagnosis not present

## 2013-05-11 DIAGNOSIS — E783 Hyperchylomicronemia: Secondary | ICD-10-CM | POA: Diagnosis not present

## 2013-05-21 DIAGNOSIS — Z1231 Encounter for screening mammogram for malignant neoplasm of breast: Secondary | ICD-10-CM | POA: Diagnosis not present

## 2013-06-01 ENCOUNTER — Other Ambulatory Visit: Payer: Self-pay | Admitting: Emergency Medicine

## 2013-06-16 ENCOUNTER — Encounter: Payer: Self-pay | Admitting: Emergency Medicine

## 2013-06-18 DIAGNOSIS — E783 Hyperchylomicronemia: Secondary | ICD-10-CM | POA: Diagnosis not present

## 2013-06-18 DIAGNOSIS — I7789 Other specified disorders of arteries and arterioles: Secondary | ICD-10-CM | POA: Diagnosis not present

## 2013-06-18 DIAGNOSIS — I10 Essential (primary) hypertension: Secondary | ICD-10-CM | POA: Diagnosis not present

## 2013-07-06 ENCOUNTER — Encounter: Payer: Self-pay | Admitting: Emergency Medicine

## 2013-07-06 ENCOUNTER — Ambulatory Visit (INDEPENDENT_AMBULATORY_CARE_PROVIDER_SITE_OTHER): Payer: Medicare Other | Admitting: Emergency Medicine

## 2013-07-06 VITALS — BP 115/60 | HR 60 | Temp 97.8°F | Resp 16 | Ht 59.5 in | Wt 109.0 lb

## 2013-07-06 DIAGNOSIS — R0989 Other specified symptoms and signs involving the circulatory and respiratory systems: Secondary | ICD-10-CM

## 2013-07-06 DIAGNOSIS — I10 Essential (primary) hypertension: Secondary | ICD-10-CM

## 2013-07-06 DIAGNOSIS — Z79899 Other long term (current) drug therapy: Secondary | ICD-10-CM | POA: Diagnosis not present

## 2013-07-06 DIAGNOSIS — E785 Hyperlipidemia, unspecified: Secondary | ICD-10-CM | POA: Diagnosis not present

## 2013-07-06 LAB — CBC WITH DIFFERENTIAL/PLATELET
Basophils Absolute: 0.1 10*3/uL (ref 0.0–0.1)
Basophils Relative: 1 % (ref 0–1)
EOS PCT: 2 % (ref 0–5)
Eosinophils Absolute: 0.1 10*3/uL (ref 0.0–0.7)
HEMATOCRIT: 39.9 % (ref 36.0–46.0)
Hemoglobin: 13.4 g/dL (ref 12.0–15.0)
LYMPHS ABS: 1.7 10*3/uL (ref 0.7–4.0)
LYMPHS PCT: 23 % (ref 12–46)
MCH: 30 pg (ref 26.0–34.0)
MCHC: 33.6 g/dL (ref 30.0–36.0)
MCV: 89.5 fL (ref 78.0–100.0)
MONO ABS: 0.6 10*3/uL (ref 0.1–1.0)
Monocytes Relative: 8 % (ref 3–12)
NEUTROS ABS: 4.8 10*3/uL (ref 1.7–7.7)
Neutrophils Relative %: 66 % (ref 43–77)
PLATELETS: 175 10*3/uL (ref 150–400)
RBC: 4.46 MIL/uL (ref 3.87–5.11)
RDW: 14.2 % (ref 11.5–15.5)
WBC: 7.2 10*3/uL (ref 4.0–10.5)

## 2013-07-06 LAB — LIPID PANEL
CHOL/HDL RATIO: 2.1 ratio
Cholesterol: 126 mg/dL (ref 0–200)
HDL: 60 mg/dL (ref >39)
LDL Cholesterol: 41 mg/dL (ref 0–99)
TRIGLYCERIDES: 126 mg/dL (ref <150)
VLDL: 25 mg/dL (ref 0–40)

## 2013-07-06 LAB — COMPLETE METABOLIC PANEL WITH GFR
ALK PHOS: 64 U/L (ref 39–117)
ALT: 16 U/L (ref 0–35)
AST: 17 U/L (ref 0–37)
Albumin: 4.2 g/dL (ref 3.5–5.2)
BUN: 21 mg/dL (ref 6–23)
CO2: 24 mEq/L (ref 19–32)
Calcium: 9.2 mg/dL (ref 8.4–10.5)
Chloride: 104 mEq/L (ref 96–112)
Creat: 1.02 mg/dL (ref 0.50–1.10)
GFR, Est African American: 64 mL/min
GFR, Est Non African American: 55 mL/min — ABNORMAL LOW
Glucose, Bld: 92 mg/dL (ref 70–99)
Potassium: 4.1 mEq/L (ref 3.5–5.3)
SODIUM: 137 meq/L (ref 135–145)
TOTAL PROTEIN: 6.8 g/dL (ref 6.0–8.3)
Total Bilirubin: 0.6 mg/dL (ref 0.2–1.2)

## 2013-07-06 MED ORDER — AMLODIPINE BESYLATE 2.5 MG PO TABS: 5.0000 mg | ORAL_TABLET | Freq: Every day | ORAL | Status: DC

## 2013-07-06 MED ORDER — DESLORATADINE 5 MG PO TABS: ORAL_TABLET | ORAL | Status: DC

## 2013-07-06 MED ORDER — ACEBUTOLOL HCL 200 MG PO CAPS: ORAL_CAPSULE | ORAL | Status: DC

## 2013-07-06 MED ORDER — FLUTICASONE PROPIONATE 50 MCG/ACT NA SUSP: 2.0000 | Freq: Every day | NASAL | Status: DC

## 2013-07-06 MED ORDER — VALSARTAN 320 MG PO TABS: ORAL_TABLET | ORAL | Status: DC

## 2013-07-06 NOTE — Progress Notes (Signed)
  This chart was scribed for Darlyne Russian, MD by Eston Mould, ED Scribe. This patient was seen in room Room/bed 22 and the patient's care was started at 9:42 AM. Subjective:    Patient ID: Stephanie Aguilar, female    DOB: 1940-05-08, 73 y.o.   MRN: [id number] Chief Complaint  Patient presents with  . Follow-up    BP, labs   HPI Stephanie Aguilar is a 73 y.o. female who presents to the [facility] for BP and labs F/U. Pt states her BP readings at home, dropped after starting her Amlodipine. She states she called her cardiologist and was advised to take half a tablet at night. She states she F/U with her cardiologist 3 weeks ago and was informed her Dr. Everlene Farrier can continue with the care. Pt states she will F/U with Cardiologist in 6 months. Pt states her cardiologist "put her on Niacin without extended tent and anti-flush" and got the Niacin from [employer]. Pt states his, cardiologist, hope is for pt to discontinue taking her Crestor. She states she is on 5 mg and reports taking half a tablet x daily of Crestor. She states her BP reading are otherwise WNL. Pt states she is still taking Diovan. Pt is requesting a Flonase & Claritin prescription.  Review of Systems  Constitutional: Negative for activity change and appetite change.  HENT: Negative for congestion.   All other systems reviewed and are negative.   Objective:   Physical Exam CONSTITUTIONAL: Well developed/well nourished HEAD: Normocephalic/atraumatic EYES: EOMI/PERRL ENMT: Mucous membranes moist NECK: supple no meningeal signs SPINE:entire spine nontender CV: S1/S2 noted, no murmurs/rubs/gallops noted. [place] to the L carotid. LUNGS: Lungs are clear to auscultation bilaterally, no apparent distress ABDOMEN: soft, nontender, no rebound or guarding GU:no cva tenderness NEURO: Pt is awake/alert, moves all extremitiesx4 EXTREMITIES: pulses normal, full ROM SKIN: warm, color normal PSYCH: no  abnormalities of mood noted   Triage Vitals:BP 115/60  Pulse 60  Temp(Src) 97.8 F (36.6 C)  Resp 16  Ht 4' 11.5" (1.511 m)  Wt 109 lb (49.442 kg)  BMI 21.66 kg/m2  SpO2 100% Assessment & Plan:   Patient is doing very well. She is having difficulty with cutting her medications with the cutter. I advised her to take the Crestor 5 mg every other day. I changed her amlodipine to the 2.5 mg dosage and she will take that once a day  I personally performed the services described in this documentation, which was scribed in my presence. The recorded information has been reviewed and is accurate.

## 2013-08-06 ENCOUNTER — Telehealth: Payer: Self-pay

## 2013-08-06 MED ORDER — AMLODIPINE BESYLATE 2.5 MG PO TABS: 2.5000 mg | ORAL_TABLET | Freq: Every day | ORAL | Status: DC

## 2013-08-06 NOTE — Telephone Encounter (Signed)
HT sent fax that pt reports she is taking just 2.5 mg amlodipine daily. Verified this with Dr Perfecto Kingdom 07/06/13 OV notes and resent new corrected Rx w RFs Dr Everlene Farrier sent in April

## 2013-09-09 DIAGNOSIS — Z124 Encounter for screening for malignant neoplasm of cervix: Secondary | ICD-10-CM | POA: Diagnosis not present

## 2013-09-09 DIAGNOSIS — Z01419 Encounter for gynecological examination (general) (routine) without abnormal findings: Secondary | ICD-10-CM | POA: Diagnosis not present

## 2013-09-15 ENCOUNTER — Ambulatory Visit (INDEPENDENT_AMBULATORY_CARE_PROVIDER_SITE_OTHER): Payer: Medicare Other | Admitting: Emergency Medicine

## 2013-09-15 VITALS — BP 140/100 | HR 54 | Temp 97.8°F | Resp 16 | Ht 59.5 in | Wt 107.0 lb

## 2013-09-15 DIAGNOSIS — R21 Rash and other nonspecific skin eruption: Secondary | ICD-10-CM | POA: Diagnosis not present

## 2013-09-15 LAB — POCT SKIN KOH: SKIN KOH, POC: NEGATIVE

## 2013-09-15 MED ORDER — CLOBETASOL PROPIONATE 0.05 % EX SOLN: 1.0000 "application " | Freq: Two times a day (BID) | CUTANEOUS | Status: DC

## 2013-09-15 MED ORDER — PREDNISONE 10 MG PO TABS: ORAL_TABLET | ORAL | Status: DC

## 2013-09-15 NOTE — Progress Notes (Addendum)
Subjective:    Patient ID: Stephanie Aguilar, female    DOB: 12/16/1940, 73 y.o.   MRN: [id number]  HPI Chief Complaint  Patient presents with  . Dry skin patches    mostly on head and back    This chart was scribed for [name], MD by Thea Alken, ED Scribe. This patient was seen in room 8 and the patient's care was started at 11:27 AM.  HPI Comments: Stephanie Aguilar is a 73 y.o. female who presents to the [facility] complaining of an itchy, dry, skin patches to scalp and back. Pt reports this has been an intermittent problem for years that is worse in the winter. She reports stress worsens this as well. Pt has tried clobetasol, Nizoral shampoo and dandruff shampoos. Pt last used clobetasol last night. She has also used cortisone cream which has helped temporarily. Pt is concerned this may be psoriasis.   Pt has been taking Claritin and Flonase for allergies .  Pt states she last checked her BP 1 day and was 115/69. She reports this has been a persistent reading for her.    Patient Active Problem List   Diagnosis Date Noted  . Carotid bruit 07/06/2013  . Hypertension 02/04/2012  . Hyperlipidemia 02/04/2012  . Alopecia areata 02/04/2012  . Menopausal syndrome on hormone replacement therapy 02/04/2012  . Diarrhea 12/10/2011   Past Medical History  Diagnosis Date  . Allergy   . Depression   . Anxiety   . Hypertension    Allergies  Allergen Reactions  . Erythromycin     Sick on stomach  . Latex   . Lisinopril   . Sulfa Antibiotics     rash   Prior to Admission medications   Medication Sig Start Date End Date Taking? Authorizing Joby Hershkowitz  acebutolol (SECTRAL) 200 MG capsule Take one tablet daily 07/06/13  Yes Darlyne Russian, MD  amLODipine (NORVASC) 2.5 MG tablet Take 1 tablet (2.5 mg total) by mouth daily. 08/06/13  Yes Darlyne Russian, MD  aspirin 81 MG tablet Take 81 mg by mouth daily.   Yes Historical Ellah Otte, MD  clobetasol (TEMOVATE)  0.05 % external solution Apply 1 application topically 2 (two) times daily. 07/16/12  Yes Darlyne Russian, MD  cycloSPORINE (RESTASIS) 0.05 % ophthalmic emulsion 1 drop 2 (two) times daily.   Yes Historical Ziasia Lenoir, MD  desloratadine (CLARINEX) 5 MG tablet TAKE 1 TABLET BY MOUTH DAILY AS DIRECTED 07/06/13  Yes Darlyne Russian, MD  estradiol (VIVELLE-DOT) 0.05 MG/24HR Place 1 patch onto the skin once a week.   Yes Historical Daxton Nydam, MD  fluticasone (FLONASE) 50 MCG/ACT nasal spray Place 2 sprays into both nostrils daily. 07/06/13  Yes Darlyne Russian, MD  LORazepam (ATIVAN) 0.5 MG tablet TAKE 1 TABLET BY MOUTH TWICE DAILY 03/02/13  Yes Darlyne Russian, MD  METRONIDAZOLE, TOPICAL, (METROLOTION) 0.75 % LOTN Apply topically to affected area. 09/01/12  Yes Darlyne Russian, MD  OVER THE COUNTER MEDICATION OTC Imodium taking daily for diarrrhea   Yes Historical Wilman Tucker, MD  rosuvastatin (CRESTOR) 5 MG tablet Take one half tablet daily 03/02/13  Yes Darlyne Russian, MD  valsartan (DIOVAN) 320 MG tablet TAKE 1 TABLET BY MOUTH DAILY 07/06/13  Yes Darlyne Russian, MD  niacin 500 MG tablet Take 500 mg by mouth at bedtime.    Historical Modest Draeger, MD   Review of Systems  Constitutional: Negative for fever and chills.  Skin: Positive for  rash.       Objective:   Physical Exam CONSTITUTIONAL: Well developed/well nourished HEAD: Normocephalic/atraumatic EYES: EOMI/PERRL ENMT: Mucous membranes moist NECK: supple no meningeal signs SPINE:entire spine nontender CV: S1/S2 noted, no murmurs/rubs/gallops noted LUNGS: Lungs are clear to auscultation bilaterally, no apparent distress ABDOMEN: soft, nontender, no rebound or guarding GU:no cva tenderness NEURO: Pt is awake/alert, moves all extremitiesx4 EXTREMITIES: pulses normal, full ROM SKIN:  Redness of entire scalp with some scaling. Marked redness to the back of the neck. Irregular 2cm longitudinal patches on upper back. PSYCH: no abnormalities of mood noted  Results for  orders placed in visit on 09/15/13  POCT SKIN KOH      Result Value Ref Range   Skin KOH, POC Negative        Assessment & Plan:  I suspect this is seborrheic dermatitis. She may also have some patch of eczema on her back. We'll treat with prednisone and low-dose 10 mg a day for 4 days then 5 mg for  4 days she'll continue on her steroid drops for her scalp.I personally performed the services described in this documentation, which was scribed in my presence. The recorded information has been reviewed and is accurate.

## 2013-09-15 NOTE — Patient Instructions (Signed)
Seborrheic Dermatitis °Seborrheic dermatitis involves pink or red skin with greasy, flaky scales. This is often found on the scalp, eyebrows, nose, bearded area, and on or behind the ears. It can also occur on the central chest. It often occurs where there are more oil (sebaceous) glands. This condition is also known as dandruff. When this condition affects a baby's scalp, it is called cradle cap. It may come and go for no known reason. It can occur at any time of life from infancy to old age. °CAUSES  °The cause is unknown. It is not the result of too little moisture or too much oil. In some people, seborrheic dermatitis flare-ups seem to be triggered by stress. It also commonly occurs in people with certain diseases such as Parkinson's disease or HIV/AIDS. °SYMPTOMS  °· Thick scales on the scalp. °· Redness on the face or in the armpits. °· The skin may seem oily or dry, but moisturizers do not help. °· In infants, seborrheic dermatitis appears as scaly redness that does not seem to bother the baby. In some babies, it affects only the scalp. In others, it also affects the neck creases, armpits, groin, or behind the ears. °· In adults and adolescents, seborrheic dermatitis may affect only the scalp. It may look patchy or spread out, with areas of redness and flaking. Other areas commonly affected include: °¨ Eyebrows. °¨ Eyelids. °¨ Forehead. °¨ Skin behind the ears. °¨ Outer ears. °¨ Chest. °¨ Armpits. °¨ Nose creases. °¨ Skin creases under the breasts. °¨ Skin between the buttocks. °¨ Groin. °· Some adults and adolescents feel itching or burning in the affected areas. °DIAGNOSIS  °Your caregiver can usually tell what the problem is by doing a physical exam. °TREATMENT  °· Cortisone (steroid) ointments, creams, and lotions can help decrease inflammation. °· Babies can be treated with baby oil to soften the scales, then they may be washed with baby shampoo. If this does not help, a prescription topical steroid  medicine may work. °· Adults can use medicated shampoos. °· Your caregiver may prescribe corticosteroid cream and shampoo containing an antifungal or yeast medicine (ketoconazole). Hydrocortisone or anti-yeast cream can be rubbed directly onto seborrheic dermatitis patches. Yeast does not cause seborrheic dermatitis, but it seems to add to the problem. °In infants, seborrheic dermatitis is often worst during the first year of life. It tends to disappear on its own as the child grows. However, it may return during the teenage years. In adults and adolescents, seborrheic dermatitis tends to be a long-lasting condition that comes and goes over many years. °HOME CARE INSTRUCTIONS  °· Use prescribed medicines as directed. °· In infants, do not aggressively remove the scales or flakes on the scalp with a comb or by other means. This may lead to hair loss. °SEEK MEDICAL CARE IF:  °· The problem does not improve from the medicated shampoos, lotions, or other medicines given by your caregiver. °· You have any other questions or concerns. °Document Released: 03/18/2005 Document Revised: 09/17/2011 Document Reviewed: 08/07/2009 °Exit[employer]® Patient Information ©2015 [employer]. This information is not intended to replace advice given to you by your health care provider. Make sure you discuss any questions you have with your health care provider. ° °

## 2013-10-13 ENCOUNTER — Ambulatory Visit (INDEPENDENT_AMBULATORY_CARE_PROVIDER_SITE_OTHER): Payer: Medicare Other | Admitting: Emergency Medicine

## 2013-10-13 VITALS — BP 118/76 | HR 67 | Temp 98.3°F | Resp 14 | Ht 59.25 in | Wt 109.0 lb

## 2013-10-13 DIAGNOSIS — S43499A Other sprain of unspecified shoulder joint, initial encounter: Secondary | ICD-10-CM

## 2013-10-13 DIAGNOSIS — R079 Chest pain, unspecified: Secondary | ICD-10-CM

## 2013-10-13 DIAGNOSIS — S46819A Strain of other muscles, fascia and tendons at shoulder and upper arm level, unspecified arm, initial encounter: Secondary | ICD-10-CM | POA: Diagnosis not present

## 2013-10-13 MED ORDER — NAPROXEN SODIUM 550 MG PO TABS: 550.0000 mg | ORAL_TABLET | Freq: Two times a day (BID) | ORAL | Status: DC

## 2013-10-13 MED ORDER — CYCLOBENZAPRINE HCL 10 MG PO TABS: 10.0000 mg | ORAL_TABLET | Freq: Three times a day (TID) | ORAL | Status: DC | PRN

## 2013-10-13 NOTE — Patient Instructions (Signed)
Angina Pectoris Angina pectoris, often just called angina, is extreme discomfort in your chest, neck, or arm caused by a lack of blood in the middle and thickest layer of your heart wall (myocardium). It may feel like tightness or heavy pressure. It may feel like a crushing or squeezing pain. Some people say it feels like gas or indigestion. It may go down your shoulders, back, and arms. Some people may have symptoms other than pain. These symptoms include fatigue, shortness of breath, cold sweats, or nausea. There are four different types of angina:  Stable angina--Stable angina usually occurs in episodes of predictable frequency and duration. It usually is brought on by physical activity, emotional stress, or excitement. These are all times when the myocardium needs more oxygen. Stable angina usually lasts a few minutes and often is relieved by taking a medicine that can be taken under your tongue (sublingually). The medicine is called nitroglycerin. Stable angina is caused by a buildup of plaque inside the arteries, which restricts blood flow to the heart muscle (atherosclerosis).  Unstable angina--Unstable angina can occur even when your body experiences little or no physical exertion. It can occur during sleep. It can also occur at rest. It can suddenly increase in severity or frequency. It might not be relieved by sublingual nitroglycerin. It can last up to 30 minutes. The most common cause of unstable angina is a blood clot that has developed on the top of plaque buildup inside a coronary artery. It can lead to a heart attack if the blood clot completely blocks the artery.  Microvascular angina--This type of angina is caused by a disorder of tiny blood vessels called arterioles. Microvascular angina is more common in women. The pain may be more severe and last longer than other types of angina pectoris.  Prinzmetal or variant angina--This type of angina pectoris usually occurs when your body  experiences little or no physical exertion. It especially occurs in the early morning hours. It is caused by a spasm of your coronary artery. HOME CARE INSTRUCTIONS   Only take over-the-counter and prescription medicines as directed by your health care provider.  Stay active or increase your exercise as directed by your health care provider.  Limit strenuous activity as directed by your health care provider.  Limit heavy lifting as directed by your health care provider.  Maintain a healthy weight.  Learn about and eat heart-healthy foods.  Do not use any tobacco products including cigarettes, chewing tobacco or electronic cigarettes. SEEK IMMEDIATE MEDICAL CARE IF:  You experience the following symptoms:  Chest, neck, deep shoulder, or arm pain or discomfort that lasts more than a few minutes.  Chest, neck, deep shoulder, or arm pain or discomfort that goes away and comes back, repeatedly.  Heavy sweating with discomfort, without a noticeable cause.  Shortness of breath or difficulty breathing.  Angina that does not get better after a few minutes of rest or after taking sublingual nitroglycerin. These can all be symptoms of a heart attack, which is a medical emergency! Get medical help at once. Call your local emergency service (911 in [place] immediately. Do not  drive yourself to the hospital and do not  wait to for your symptoms to go away. MAKE SURE YOU:  Understand these instructions.  Will watch your condition.  Will get help right away if you are not doing well or get worse. Document Released: 03/18/2005 Document Revised: 03/23/2013 Document Reviewed: 12/26/2011 [facility] Patient Information 2015 [place], [place]. This information is not intended  to replace advice given to you by your health care provider. Make sure you discuss any questions you have with your health care provider. Muscle Strain A muscle strain is an injury that occurs when a muscle is stretched beyond its  normal length. Usually a small number of muscle fibers are torn when this happens. Muscle strain is rated in degrees. First-degree strains have the least amount of muscle fiber tearing and pain. Second-degree and third-degree strains have increasingly more tearing and pain.  Usually, recovery from muscle strain takes 1-2 weeks. Complete healing takes 5-6 weeks.  CAUSES  Muscle strain happens when a sudden, violent force placed on a muscle stretches it too far. This may occur with lifting, sports, or a fall.  RISK FACTORS Muscle strain is especially common in athletes.  SIGNS AND SYMPTOMS At the site of the muscle strain, there may be:  Pain.  Bruising.  Swelling.  Difficulty using the muscle due to pain or lack of normal function. DIAGNOSIS  Your health care provider will perform a physical exam and ask about your medical history. TREATMENT  Often, the best treatment for a muscle strain is resting, icing, and applying cold compresses to the injured area.  HOME CARE INSTRUCTIONS   Use the PRICE method of treatment to promote muscle healing during the first 2-3 days after your injury. The PRICE method involves:  Protecting the muscle from being injured again.  Restricting your activity and resting the injured body part.  Icing your injury. To do this, put ice in a plastic bag. Place a towel between your skin and the bag. Then, apply the ice and leave it on from 15-20 minutes each hour. After the third day, switch to moist heat packs.  Apply compression to the injured area with a splint or elastic bandage. Be careful not to wrap it too tightly. This may interfere with blood circulation or increase swelling.  Elevate the injured body part above the level of your heart as often as you can.  Only take over-the-counter or prescription medicines for pain, discomfort, or fever as directed by your health care provider.  Warming up prior to exercise helps to prevent future muscle  strains. SEEK MEDICAL CARE IF:   You have increasing pain or swelling in the injured area.  You have numbness, tingling, or a significant loss of strength in the injured area. MAKE SURE YOU:   Understand these instructions.  Will watch your condition.  Will get help right away if you are not doing well or get worse. Document Released: 03/18/2005 Document Revised: 01/06/2013 Document Reviewed: 10/15/2012 [facility] Patient Information 2015 [place], [place]. This information is not intended to replace advice given to you by your health care provider. Make sure you discuss any questions you have with your health care provider.

## 2013-10-13 NOTE — Progress Notes (Signed)
[facility] [address], [place] [address] [phone]  Date:  10/13/2013   Name:  Stephanie Aguilar   DOB:  05/25/1940   MRN:  [id number]  PCP:  Jenny Reichmann, MD    Chief Complaint: Shoulder Pain and Back Pain   History of Present Illness:  Stephanie Aguilar is a 73 y.o. very pleasant female patient who presents with the following:  Patient worked in yard Saturday morning.  Developed pain in lower chest radiating into left shoulder and left arm.  Pain lasted several hours.  Not associated with nausea, palpitations, or shortness of breath.  Took some advil and rested, pain improved.  Did not feel well Sunday after church.  Now has pain across her shoulders worse when leans forward.  Pain not radiating.  No neuro symptoms.  No shortness of breath or nausea or vomiting.  Has no palpitations or sensation of rapid heartrate. Pain now is limited to her shoulders.  No radiation of pain, weakness.   No improvement with over the counter medications or other home remedies. Denies other complaint or health concern today.    Patient Active Problem List   Diagnosis Date Noted  . Carotid bruit 07/06/2013  . Hypertension 02/04/2012  . Hyperlipidemia 02/04/2012  . Alopecia areata 02/04/2012  . Menopausal syndrome on hormone replacement therapy 02/04/2012  . Diarrhea 12/10/2011    Past Medical History  Diagnosis Date  . Allergy   . Depression   . Anxiety   . Hypertension     Past Surgical History  Procedure Laterality Date  . Appendectomy    . Breast surgery    . Tonsillectomy and adenoidectomy    . Abdominal hysterectomy      History  Substance Use Topics  . Smoking status: Never Smoker   . Smokeless tobacco: Not on file  . Alcohol Use: Yes     Comment: 1-2 oz weekly    Family History  Problem Relation Age of Onset  . Hypertension Mother   . Alcohol abuse Father   . COPD Brother   . Hypertension Brother   . Alcohol abuse Brother    . Heart disease Maternal Grandmother   . Cancer Maternal Grandfather   . Stroke Paternal Grandmother   . Gout Brother   . Alcohol abuse Brother     Allergies  Allergen Reactions  . Erythromycin     Sick on stomach  . Latex   . Lisinopril   . Sulfa Antibiotics     rash    Medication list has been reviewed and updated.  Current Outpatient Prescriptions on File Prior to Visit  Medication Sig Dispense Refill  . acebutolol (SECTRAL) 200 MG capsule Take one tablet daily  90 capsule  3  . amLODipine (NORVASC) 2.5 MG tablet Take 1 tablet (2.5 mg total) by mouth daily.  90 tablet  3  . aspirin 81 MG tablet Take 81 mg by mouth daily.      . clobetasol (TEMOVATE) 0.05 % external solution Apply 1 application topically 2 (two) times daily. Applied 10 drops to scalp at bedtime  50 mL  3  . cycloSPORINE (RESTASIS) 0.05 % ophthalmic emulsion 1 drop 2 (two) times daily.      Marland Kitchen desloratadine (CLARINEX) 5 MG tablet TAKE 1 TABLET BY MOUTH DAILY AS DIRECTED  90 tablet  3  . estradiol (VIVELLE-DOT) 0.05 MG/24HR Place 1 patch onto the skin once a week.      Marland Kitchen  fluticasone (FLONASE) 50 MCG/ACT nasal spray Place 2 sprays into both nostrils daily.  16 g  11  . LORazepam (ATIVAN) 0.5 MG tablet TAKE 1 TABLET BY MOUTH TWICE DAILY  180 tablet  1  . METRONIDAZOLE, TOPICAL, (METROLOTION) 0.75 % LOTN Apply topically to affected area.  59 Bottle  11  . OVER THE COUNTER MEDICATION OTC Imodium taking daily for diarrrhea      . predniSONE (DELTASONE) 10 MG tablet Take one a day for 4 days then a half tablet a day for 4 days  6 tablet  0  . rosuvastatin (CRESTOR) 5 MG tablet Take one half tablet daily  45 tablet  3  . valsartan (DIOVAN) 320 MG tablet TAKE 1 TABLET BY MOUTH DAILY  90 tablet  3  . niacin 500 MG tablet Take 500 mg by mouth at bedtime.       No current facility-administered medications on file prior to visit.    Review of Systems:  As per HPI, otherwise negative.    Physical Examination: Filed  Vitals:   10/13/13 1430  BP: 118/76  Pulse: 67  Temp: 98.3 F (36.8 C)  Resp: 14   Filed Vitals:   10/13/13 1430  Height: 4' 11.25" (1.505 m)  Weight: 109 lb (49.442 kg)   Body mass index is 21.83 kg/(m^2). Ideal Body Weight: Weight in (lb) to have BMI = 25: 124.6  GEN: WDWN, NAD, Non-toxic, A & O x 3 HEENT: Atraumatic, Normocephalic. Neck supple. No masses, No LAD. Ears and Nose: No external deformity. CV: RRR, No M/G/R. No JVD. No thrill. No extra heart sounds. PULM: CTA B, no wheezes, crackles, rhonchi. No retractions. No resp. distress. No accessory muscle use. ABD: S, NT, ND, +BS. No rebound. No HSM. EXTR: No c/c/e NEURO Normal gait.  PSYCH: Normally interactive. Conversant. Not depressed or anxious appearing.  Calm demeanor.  BACK:  shoulders tender worse against resistance  Assessment and Plan: Muscle strain ?angina Dr Nadyne Coombes  Signed,  Ellison Carwin, MD

## 2013-10-18 ENCOUNTER — Other Ambulatory Visit: Payer: Self-pay | Admitting: Emergency Medicine

## 2013-11-16 ENCOUNTER — Other Ambulatory Visit: Payer: Self-pay

## 2013-11-16 MED ORDER — LORAZEPAM 0.5 MG PO TABS: ORAL_TABLET | ORAL | Status: DC

## 2013-11-16 NOTE — Telephone Encounter (Signed)
Pharm reqs RF of lorazepam. Pended. 

## 2013-11-30 ENCOUNTER — Encounter: Payer: Self-pay | Admitting: Emergency Medicine

## 2013-12-02 DIAGNOSIS — H40019 Open angle with borderline findings, low risk, unspecified eye: Secondary | ICD-10-CM | POA: Diagnosis not present

## 2013-12-02 DIAGNOSIS — H2510 Age-related nuclear cataract, unspecified eye: Secondary | ICD-10-CM | POA: Diagnosis not present

## 2013-12-02 DIAGNOSIS — H04129 Dry eye syndrome of unspecified lacrimal gland: Secondary | ICD-10-CM | POA: Diagnosis not present

## 2013-12-14 ENCOUNTER — Other Ambulatory Visit: Payer: Self-pay | Admitting: Emergency Medicine

## 2013-12-15 DIAGNOSIS — I7789 Other specified disorders of arteries and arterioles: Secondary | ICD-10-CM | POA: Diagnosis not present

## 2013-12-15 DIAGNOSIS — R0989 Other specified symptoms and signs involving the circulatory and respiratory systems: Secondary | ICD-10-CM | POA: Diagnosis not present

## 2013-12-15 DIAGNOSIS — E783 Hyperchylomicronemia: Secondary | ICD-10-CM | POA: Diagnosis not present

## 2013-12-15 DIAGNOSIS — I10 Essential (primary) hypertension: Secondary | ICD-10-CM | POA: Diagnosis not present

## 2013-12-21 ENCOUNTER — Ambulatory Visit (INDEPENDENT_AMBULATORY_CARE_PROVIDER_SITE_OTHER): Payer: Medicare Other

## 2013-12-21 DIAGNOSIS — Z23 Encounter for immunization: Secondary | ICD-10-CM

## 2014-01-14 ENCOUNTER — Other Ambulatory Visit: Payer: Self-pay

## 2014-02-21 DIAGNOSIS — H01025 Squamous blepharitis left lower eyelid: Secondary | ICD-10-CM | POA: Diagnosis not present

## 2014-02-21 DIAGNOSIS — H16102 Unspecified superficial keratitis, left eye: Secondary | ICD-10-CM | POA: Diagnosis not present

## 2014-02-21 DIAGNOSIS — H04123 Dry eye syndrome of bilateral lacrimal glands: Secondary | ICD-10-CM | POA: Diagnosis not present

## 2014-02-23 DIAGNOSIS — B308 Other viral conjunctivitis: Secondary | ICD-10-CM | POA: Diagnosis not present

## 2014-02-23 DIAGNOSIS — H01025 Squamous blepharitis left lower eyelid: Secondary | ICD-10-CM | POA: Diagnosis not present

## 2014-02-23 DIAGNOSIS — H01022 Squamous blepharitis right lower eyelid: Secondary | ICD-10-CM | POA: Diagnosis not present

## 2014-02-23 DIAGNOSIS — H01021 Squamous blepharitis right upper eyelid: Secondary | ICD-10-CM | POA: Diagnosis not present

## 2014-02-23 DIAGNOSIS — H01024 Squamous blepharitis left upper eyelid: Secondary | ICD-10-CM | POA: Diagnosis not present

## 2014-03-09 DIAGNOSIS — H01025 Squamous blepharitis left lower eyelid: Secondary | ICD-10-CM | POA: Diagnosis not present

## 2014-03-09 DIAGNOSIS — H01022 Squamous blepharitis right lower eyelid: Secondary | ICD-10-CM | POA: Diagnosis not present

## 2014-03-09 DIAGNOSIS — L719 Rosacea, unspecified: Secondary | ICD-10-CM | POA: Diagnosis not present

## 2014-03-09 DIAGNOSIS — H01021 Squamous blepharitis right upper eyelid: Secondary | ICD-10-CM | POA: Diagnosis not present

## 2014-03-09 DIAGNOSIS — H02055 Trichiasis without entropian left lower eyelid: Secondary | ICD-10-CM | POA: Diagnosis not present

## 2014-03-09 DIAGNOSIS — H01024 Squamous blepharitis left upper eyelid: Secondary | ICD-10-CM | POA: Diagnosis not present

## 2014-03-09 DIAGNOSIS — H04123 Dry eye syndrome of bilateral lacrimal glands: Secondary | ICD-10-CM | POA: Diagnosis not present

## 2014-03-15 ENCOUNTER — Ambulatory Visit (INDEPENDENT_AMBULATORY_CARE_PROVIDER_SITE_OTHER): Payer: Medicare Other | Admitting: Emergency Medicine

## 2014-03-15 ENCOUNTER — Encounter: Payer: Self-pay | Admitting: Emergency Medicine

## 2014-03-15 VITALS — BP 130/74 | HR 58 | Temp 97.4°F | Resp 16 | Ht 59.75 in | Wt 107.0 lb

## 2014-03-15 DIAGNOSIS — E785 Hyperlipidemia, unspecified: Secondary | ICD-10-CM | POA: Diagnosis not present

## 2014-03-15 DIAGNOSIS — R21 Rash and other nonspecific skin eruption: Secondary | ICD-10-CM

## 2014-03-15 DIAGNOSIS — I10 Essential (primary) hypertension: Secondary | ICD-10-CM

## 2014-03-15 DIAGNOSIS — Z13820 Encounter for screening for osteoporosis: Secondary | ICD-10-CM | POA: Diagnosis not present

## 2014-03-15 DIAGNOSIS — Z0000 Encounter for general adult medical examination without abnormal findings: Secondary | ICD-10-CM

## 2014-03-15 DIAGNOSIS — M810 Age-related osteoporosis without current pathological fracture: Secondary | ICD-10-CM

## 2014-03-15 DIAGNOSIS — Z23 Encounter for immunization: Secondary | ICD-10-CM

## 2014-03-15 DIAGNOSIS — E559 Vitamin D deficiency, unspecified: Secondary | ICD-10-CM

## 2014-03-15 LAB — COMPLETE METABOLIC PANEL WITH GFR
ALT: 17 U/L (ref 0–35)
AST: 20 U/L (ref 0–37)
Albumin: 4.2 g/dL (ref 3.5–5.2)
Alkaline Phosphatase: 63 U/L (ref 39–117)
BILIRUBIN TOTAL: 0.6 mg/dL (ref 0.2–1.2)
BUN: 18 mg/dL (ref 6–23)
CO2: 29 meq/L (ref 19–32)
Calcium: 9.5 mg/dL (ref 8.4–10.5)
Chloride: 105 mEq/L (ref 96–112)
Creat: 0.89 mg/dL (ref 0.50–1.10)
GFR, EST AFRICAN AMERICAN: 75 mL/min
GFR, EST NON AFRICAN AMERICAN: 65 mL/min
GLUCOSE: 79 mg/dL (ref 70–99)
Potassium: 4.4 mEq/L (ref 3.5–5.3)
Sodium: 140 mEq/L (ref 135–145)
Total Protein: 6.9 g/dL (ref 6.0–8.3)

## 2014-03-15 LAB — POCT URINALYSIS DIPSTICK
Bilirubin, UA: NEGATIVE
Blood, UA: NEGATIVE
GLUCOSE UA: NEGATIVE
Ketones, UA: NEGATIVE
Leukocytes, UA: NEGATIVE
NITRITE UA: NEGATIVE
PH UA: 5
PROTEIN UA: NEGATIVE
SPEC GRAV UA: 1.01
UROBILINOGEN UA: 0.2

## 2014-03-15 LAB — CBC WITH DIFFERENTIAL/PLATELET
Basophils Absolute: 0.1 10*3/uL (ref 0.0–0.1)
Basophils Relative: 1 % (ref 0–1)
EOS ABS: 0.2 10*3/uL (ref 0.0–0.7)
Eosinophils Relative: 3 % (ref 0–5)
HCT: 42.6 % (ref 36.0–46.0)
HEMOGLOBIN: 14.5 g/dL (ref 12.0–15.0)
LYMPHS ABS: 1.4 10*3/uL (ref 0.7–4.0)
Lymphocytes Relative: 22 % (ref 12–46)
MCH: 30.4 pg (ref 26.0–34.0)
MCHC: 34 g/dL (ref 30.0–36.0)
MCV: 89.3 fL (ref 78.0–100.0)
MPV: 10.2 fL (ref 9.4–12.4)
Monocytes Absolute: 0.6 10*3/uL (ref 0.1–1.0)
Monocytes Relative: 9 % (ref 3–12)
Neutro Abs: 4.2 10*3/uL (ref 1.7–7.7)
Neutrophils Relative %: 65 % (ref 43–77)
Platelets: 183 10*3/uL (ref 150–400)
RBC: 4.77 MIL/uL (ref 3.87–5.11)
RDW: 13.8 % (ref 11.5–15.5)
WBC: 6.4 10*3/uL (ref 4.0–10.5)

## 2014-03-15 LAB — LIPID PANEL
Cholesterol: 136 mg/dL (ref 0–200)
HDL: 53 mg/dL (ref >39)
LDL Cholesterol: 52 mg/dL (ref 0–99)
TRIGLYCERIDES: 157 mg/dL — AB (ref <150)
Total CHOL/HDL Ratio: 2.6 Ratio
VLDL: 31 mg/dL (ref 0–40)

## 2014-03-15 MED ORDER — ACEBUTOLOL HCL 200 MG PO CAPS: ORAL_CAPSULE | ORAL | Status: DC

## 2014-03-15 MED ORDER — LORAZEPAM 0.5 MG PO TABS: ORAL_TABLET | ORAL | Status: DC

## 2014-03-15 MED ORDER — METRONIDAZOLE 0.75 % EX LOTN: TOPICAL_LOTION | CUTANEOUS | Status: DC

## 2014-03-15 MED ORDER — ROSUVASTATIN CALCIUM 5 MG PO TABS: 2.5000 mg | ORAL_TABLET | Freq: Every day | ORAL | Status: DC

## 2014-03-15 MED ORDER — CLOBETASOL PROPIONATE 0.05 % EX SOLN: 1.0000 "application " | Freq: Two times a day (BID) | CUTANEOUS | Status: DC

## 2014-03-15 NOTE — Progress Notes (Addendum)
Subjective:  This chart was scribed for Stephanie Jordan, MD by [name], ED Scribe at [facility].The patient was seen in exam room 21 and the patient's care was started at 8:19 AM.   Patient ID: Stephanie Aguilar, female    DOB: 10-15-1940, 73 y.o.   MRN: [id number] Chief Complaint  Patient presents with  . Annual Exam    no pap   HPI HPI Comments: Stephanie Aguilar is a 73 y.o. female who presents to [facility] for an annual physical exam Pt is overall well. She complains of an ongoing intermittent itching generalized in her hair, back and arms for several years. Pt want to be referred to a dermatologist. Pt uses coal tar and cortisone cream for her itching for mild relief. Her cardiologist is Dr. Einar Gip last seen in September. Her OBGYN in Dr. Si Raider and she is utd on her mammograms and PAP smears. Last colonoscopy was two years ago with Dr. Wynetta Emery. She denies CP, dysuria, and abdominal pain.   She needs refills on Lorazepam.  Pt has gotten a flu shot this year, her last TDAP was 2008. Pt has not gotten Prevnar 13, will get one today.  Patient Active Problem List   Diagnosis Date Noted  . Carotid bruit 07/06/2013  . Hypertension 02/04/2012  . Hyperlipidemia 02/04/2012  . Alopecia areata 02/04/2012  . Menopausal syndrome on hormone replacement therapy 02/04/2012  . Diarrhea 12/10/2011   Past Medical History  Diagnosis Date  . Allergy   . Depression   . Anxiety   . Hypertension    Past Surgical History  Procedure Laterality Date  . Appendectomy    . Breast surgery    . Tonsillectomy and adenoidectomy    . Abdominal hysterectomy     Allergies  Allergen Reactions  . Erythromycin     Sick on stomach  . Latex   . Lisinopril   . Sulfa Antibiotics     rash   Prior to Admission medications   Medication Sig Start Date End Date Taking? Authorizing Provider  acebutolol (SECTRAL) 200 MG capsule Take one tablet daily 07/06/13  Yes Darlyne Russian, MD    amLODipine (NORVASC) 2.5 MG tablet Take 1 tablet (2.5 mg total) by mouth daily. 08/06/13  Yes Darlyne Russian, MD  aspirin 81 MG tablet Take 81 mg by mouth daily.   Yes Historical Provider, MD  clobetasol (TEMOVATE) 0.05 % external solution Apply 1 application topically 2 (two) times daily. Applied 10 drops to scalp at bedtime 09/15/13  Yes Darlyne Russian, MD  CRESTOR 5 MG tablet TAKE ONE-HALF TABLET BY MOUTH DAILY 12/16/13  Yes Mancel Bale, PA-C  cycloSPORINE (RESTASIS) 0.05 % ophthalmic emulsion 1 drop 2 (two) times daily.   Yes Historical Provider, MD  desloratadine (CLARINEX) 5 MG tablet TAKE 1 TABLET BY MOUTH DAILY AS DIRECTED 07/06/13  Yes Darlyne Russian, MD  estradiol (VIVELLE-DOT) 0.05 MG/24HR Place 1 patch onto the skin once a week.   Yes Historical Provider, MD  fluticasone (FLONASE) 50 MCG/ACT nasal spray USE 2 SPRAYS IN EACH NOSTRIL DAILY 10/18/13  Yes Darlyne Russian, MD  LORazepam (ATIVAN) 0.5 MG tablet TAKE 1 TABLET BY MOUTH TWICE DAILY 11/16/13  Yes Darlyne Russian, MD  METRONIDAZOLE, TOPICAL, (METROLOTION) 0.75 % LOTN Apply topically to affected area. 09/01/12  Yes Darlyne Russian, MD  valsartan (DIOVAN) 320 MG tablet TAKE 1 TABLET BY MOUTH DAILY 07/06/13  Yes Darlyne Russian, MD  cyclobenzaprine (FLEXERIL) 10 MG tablet Take 1 tablet (10 mg total) by mouth 3 (three) times daily as needed for muscle spasms. Patient not taking: Reported on 03/15/2014 10/13/13   Roselee Culver, MD  naproxen sodium (ANAPROX DS) 550 MG tablet Take 1 tablet (550 mg total) by mouth 2 (two) times daily with a meal. Patient not taking: Reported on 03/15/2014 10/13/13 10/13/14  Roselee Culver, MD  niacin 500 MG tablet Take 500 mg by mouth at bedtime.    Historical Provider, MD  OVER THE COUNTER MEDICATION OTC Imodium taking daily for diarrrhea    Historical Provider, MD  predniSONE (DELTASONE) 10 MG tablet Take one a day for 4 days then a half tablet a day for 4 days Patient not taking: Reported on 03/15/2014 09/15/13    Darlyne Russian, MD   History   Social History  . Marital Status: Single    Spouse Name: N/A    Number of Children: N/A  . Years of Education: N/A   Occupational History  . retired/homemaker    Social History Main Topics  . Smoking status: Never Smoker   . Smokeless tobacco: Not on file  . Alcohol Use: Yes     Comment: 1-2 oz weekly  . Drug Use: No  . Sexual Activity: Not on file   Other Topics Concern  . Not on file   Social History Narrative   Review of Systems  Cardiovascular: Negative for chest pain.  Gastrointestinal: Negative for abdominal pain.  Genitourinary: Negative for dysuria.  Skin: Positive for rash.  All other systems reviewed and are negative. 13 point ROS per health screening. Negative other than noted above.    Objective:  BP 130/74 mmHg  Pulse 58  Temp(Src) 97.4 F (36.3 C) (Oral)  Resp 16  Ht 4' 11.75" (1.518 m)  Wt 107 lb (48.535 kg)  BMI 21.06 kg/m2  SpO2 100%    Visual Acuity Screening   Right eye Left eye Both eyes  Without correction:     With correction: 20/20 20/20 20/20     Physical Exam  Constitutional: She is oriented to person, place, and time. She appears well-developed and well-nourished. No distress.  HENT:  Head: Normocephalic and atraumatic.  Eyes: EOM are normal.  Neck: Normal range of motion.  Cardiovascular: Normal rate.   Pulmonary/Chest: Effort normal.  Neurological: She is alert and oriented to person, place, and time.  Skin: Skin is warm and dry.  Scaly scalp. Isolated red scaly areas on the arms and legs.  Psychiatric: She has a normal mood and affect. Her behavior is normal.  Nursing note and vitals reviewed.  Results for orders placed or performed in visit on 03/15/14  POCT urinalysis dipstick  Result Value Ref Range   Color, UA Yellow    Clarity, UA Slightly cloudy    Glucose, UA Negative    Bilirubin, UA Negative    Ketones, UA Negative    Spec Grav, UA 1.010    Blood, UA Negative    pH, UA 5.0     Protein, UA Negative    Urobilinogen, UA 0.2    Nitrite, UA Negative    Leukocytes, UA Negative       Assessment & Plan:  Patient doing well. She has been released by the cardiologist and we will do those follow-ups here. She is up-to-date on colonoscopy which was done 2 years ago. She has a GYN doctor and stays up-to-date on GYN evaluation and mammograms. She will be  given Prevnar today. Follow-up 6 months.I personally performed the services described in this documentation, which was scribed in my presence. The recorded information has been reviewed and is accurate.

## 2014-03-16 LAB — VITAMIN D 25 HYDROXY (VIT D DEFICIENCY, FRACTURES): Vit D, 25-Hydroxy: 28 ng/mL — ABNORMAL LOW (ref 30–100)

## 2014-03-30 ENCOUNTER — Other Ambulatory Visit: Payer: Self-pay | Admitting: Emergency Medicine

## 2014-04-05 DIAGNOSIS — L308 Other specified dermatitis: Secondary | ICD-10-CM | POA: Diagnosis not present

## 2014-04-05 DIAGNOSIS — R21 Rash and other nonspecific skin eruption: Secondary | ICD-10-CM | POA: Diagnosis not present

## 2014-04-20 DIAGNOSIS — H04123 Dry eye syndrome of bilateral lacrimal glands: Secondary | ICD-10-CM | POA: Diagnosis not present

## 2014-04-20 DIAGNOSIS — H40013 Open angle with borderline findings, low risk, bilateral: Secondary | ICD-10-CM | POA: Diagnosis not present

## 2014-04-20 DIAGNOSIS — H01021 Squamous blepharitis right upper eyelid: Secondary | ICD-10-CM | POA: Diagnosis not present

## 2014-04-20 DIAGNOSIS — H02055 Trichiasis without entropian left lower eyelid: Secondary | ICD-10-CM | POA: Diagnosis not present

## 2014-04-20 DIAGNOSIS — H01022 Squamous blepharitis right lower eyelid: Secondary | ICD-10-CM | POA: Diagnosis not present

## 2014-04-20 DIAGNOSIS — H109 Unspecified conjunctivitis: Secondary | ICD-10-CM | POA: Diagnosis not present

## 2014-04-20 DIAGNOSIS — H2513 Age-related nuclear cataract, bilateral: Secondary | ICD-10-CM | POA: Diagnosis not present

## 2014-04-20 DIAGNOSIS — H01025 Squamous blepharitis left lower eyelid: Secondary | ICD-10-CM | POA: Diagnosis not present

## 2014-04-20 DIAGNOSIS — H01024 Squamous blepharitis left upper eyelid: Secondary | ICD-10-CM | POA: Diagnosis not present

## 2014-05-04 ENCOUNTER — Other Ambulatory Visit: Payer: Self-pay | Admitting: Emergency Medicine

## 2014-06-15 DIAGNOSIS — H01025 Squamous blepharitis left lower eyelid: Secondary | ICD-10-CM | POA: Diagnosis not present

## 2014-06-15 DIAGNOSIS — H04123 Dry eye syndrome of bilateral lacrimal glands: Secondary | ICD-10-CM | POA: Diagnosis not present

## 2014-06-15 DIAGNOSIS — H01022 Squamous blepharitis right lower eyelid: Secondary | ICD-10-CM | POA: Diagnosis not present

## 2014-06-15 DIAGNOSIS — H2513 Age-related nuclear cataract, bilateral: Secondary | ICD-10-CM | POA: Diagnosis not present

## 2014-06-15 DIAGNOSIS — H109 Unspecified conjunctivitis: Secondary | ICD-10-CM | POA: Diagnosis not present

## 2014-06-15 DIAGNOSIS — H01024 Squamous blepharitis left upper eyelid: Secondary | ICD-10-CM | POA: Diagnosis not present

## 2014-06-15 DIAGNOSIS — H01021 Squamous blepharitis right upper eyelid: Secondary | ICD-10-CM | POA: Diagnosis not present

## 2014-06-24 ENCOUNTER — Other Ambulatory Visit: Payer: Self-pay

## 2014-06-24 MED ORDER — AMLODIPINE BESYLATE 2.5 MG PO TABS: 2.5000 mg | ORAL_TABLET | Freq: Every day | ORAL | Status: DC

## 2014-06-26 ENCOUNTER — Other Ambulatory Visit: Payer: Self-pay | Admitting: Emergency Medicine

## 2014-07-08 DIAGNOSIS — H01021 Squamous blepharitis right upper eyelid: Secondary | ICD-10-CM | POA: Diagnosis not present

## 2014-07-08 DIAGNOSIS — H2513 Age-related nuclear cataract, bilateral: Secondary | ICD-10-CM | POA: Diagnosis not present

## 2014-07-08 DIAGNOSIS — H01024 Squamous blepharitis left upper eyelid: Secondary | ICD-10-CM | POA: Diagnosis not present

## 2014-07-08 DIAGNOSIS — H02055 Trichiasis without entropian left lower eyelid: Secondary | ICD-10-CM | POA: Diagnosis not present

## 2014-07-08 DIAGNOSIS — H01025 Squamous blepharitis left lower eyelid: Secondary | ICD-10-CM | POA: Diagnosis not present

## 2014-07-08 DIAGNOSIS — H11122 Conjunctival concretions, left eye: Secondary | ICD-10-CM | POA: Diagnosis not present

## 2014-07-08 DIAGNOSIS — H10413 Chronic giant papillary conjunctivitis, bilateral: Secondary | ICD-10-CM | POA: Diagnosis not present

## 2014-07-08 DIAGNOSIS — Z1231 Encounter for screening mammogram for malignant neoplasm of breast: Secondary | ICD-10-CM | POA: Diagnosis not present

## 2014-07-08 DIAGNOSIS — H01022 Squamous blepharitis right lower eyelid: Secondary | ICD-10-CM | POA: Diagnosis not present

## 2014-07-08 DIAGNOSIS — M810 Age-related osteoporosis without current pathological fracture: Secondary | ICD-10-CM | POA: Diagnosis not present

## 2014-07-22 ENCOUNTER — Other Ambulatory Visit: Payer: Self-pay | Admitting: Emergency Medicine

## 2014-07-27 ENCOUNTER — Encounter: Payer: Self-pay | Admitting: Emergency Medicine

## 2014-08-02 ENCOUNTER — Other Ambulatory Visit: Payer: Self-pay | Admitting: Emergency Medicine

## 2014-08-05 ENCOUNTER — Telehealth: Payer: Self-pay

## 2014-08-05 NOTE — Telephone Encounter (Signed)
Left VM informing pt that Ativan rx is ready for pick up.

## 2014-09-20 ENCOUNTER — Other Ambulatory Visit: Payer: Self-pay | Admitting: Emergency Medicine

## 2014-09-20 ENCOUNTER — Ambulatory Visit (INDEPENDENT_AMBULATORY_CARE_PROVIDER_SITE_OTHER): Payer: Medicare Other | Admitting: Emergency Medicine

## 2014-09-20 ENCOUNTER — Encounter: Payer: Self-pay | Admitting: Emergency Medicine

## 2014-09-20 VITALS — BP 126/70 | HR 59 | Temp 98.2°F | Resp 16 | Ht 59.5 in | Wt 106.2 lb

## 2014-09-20 DIAGNOSIS — Z91048 Other nonmedicinal substance allergy status: Secondary | ICD-10-CM | POA: Diagnosis not present

## 2014-09-20 DIAGNOSIS — Z9109 Other allergy status, other than to drugs and biological substances: Secondary | ICD-10-CM

## 2014-09-20 DIAGNOSIS — T148 Other injury of unspecified body region: Secondary | ICD-10-CM

## 2014-09-20 DIAGNOSIS — E785 Hyperlipidemia, unspecified: Secondary | ICD-10-CM | POA: Diagnosis not present

## 2014-09-20 DIAGNOSIS — R0989 Other specified symptoms and signs involving the circulatory and respiratory systems: Secondary | ICD-10-CM

## 2014-09-20 DIAGNOSIS — T148XXA Other injury of unspecified body region, initial encounter: Secondary | ICD-10-CM

## 2014-09-20 DIAGNOSIS — I10 Essential (primary) hypertension: Secondary | ICD-10-CM

## 2014-09-20 LAB — LIPID PANEL
Cholesterol: 126 mg/dL (ref 0–200)
HDL: 53 mg/dL (ref >=46)
LDL Cholesterol: 42 mg/dL (ref 0–99)
TRIGLYCERIDES: 156 mg/dL — AB (ref <150)
Total CHOL/HDL Ratio: 2.4 Ratio
VLDL: 31 mg/dL (ref 0–40)

## 2014-09-20 LAB — BASIC METABOLIC PANEL WITH GFR
BUN: 20 mg/dL (ref 6–23)
CO2: 27 mEq/L (ref 19–32)
CREATININE: 0.9 mg/dL (ref 0.50–1.10)
Calcium: 9.3 mg/dL (ref 8.4–10.5)
Chloride: 105 mEq/L (ref 96–112)
GFR, EST NON AFRICAN AMERICAN: 64 mL/min
GFR, Est African American: 73 mL/min
Glucose, Bld: 87 mg/dL (ref 70–99)
Potassium: 4.6 mEq/L (ref 3.5–5.3)
Sodium: 141 mEq/L (ref 135–145)

## 2014-09-20 MED ORDER — AMLODIPINE BESYLATE 2.5 MG PO TABS: ORAL_TABLET | ORAL | Status: DC

## 2014-09-20 MED ORDER — ROSUVASTATIN CALCIUM 5 MG PO TABS: 2.5000 mg | ORAL_TABLET | Freq: Every day | ORAL | Status: DC

## 2014-09-20 MED ORDER — FLUTICASONE PROPIONATE 50 MCG/ACT NA SUSP: NASAL | Status: AC

## 2014-09-20 MED ORDER — LORAZEPAM 0.5 MG PO TABS: 0.5000 mg | ORAL_TABLET | Freq: Two times a day (BID) | ORAL | Status: DC

## 2014-09-20 MED ORDER — DESLORATADINE 5 MG PO TABS: ORAL_TABLET | ORAL | Status: DC

## 2014-09-20 NOTE — Patient Instructions (Signed)
Please take a baby aspirin every other day
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
## 2014-10-12 DIAGNOSIS — Z01419 Encounter for gynecological examination (general) (routine) without abnormal findings: Secondary | ICD-10-CM | POA: Diagnosis not present

## 2014-10-12 DIAGNOSIS — Z124 Encounter for screening for malignant neoplasm of cervix: Secondary | ICD-10-CM | POA: Diagnosis not present

## 2014-10-27 DIAGNOSIS — L731 Pseudofolliculitis barbae: Secondary | ICD-10-CM | POA: Diagnosis not present

## 2014-12-07 DIAGNOSIS — H2513 Age-related nuclear cataract, bilateral: Secondary | ICD-10-CM | POA: Diagnosis not present

## 2014-12-07 DIAGNOSIS — H40013 Open angle with borderline findings, low risk, bilateral: Secondary | ICD-10-CM | POA: Diagnosis not present

## 2014-12-07 DIAGNOSIS — H04123 Dry eye syndrome of bilateral lacrimal glands: Secondary | ICD-10-CM | POA: Diagnosis not present

## 2014-12-07 DIAGNOSIS — H01022 Squamous blepharitis right lower eyelid: Secondary | ICD-10-CM | POA: Diagnosis not present

## 2014-12-07 DIAGNOSIS — H01021 Squamous blepharitis right upper eyelid: Secondary | ICD-10-CM | POA: Diagnosis not present

## 2014-12-07 DIAGNOSIS — H01025 Squamous blepharitis left lower eyelid: Secondary | ICD-10-CM | POA: Diagnosis not present

## 2014-12-07 DIAGNOSIS — H01024 Squamous blepharitis left upper eyelid: Secondary | ICD-10-CM | POA: Diagnosis not present

## 2014-12-12 ENCOUNTER — Ambulatory Visit (INDEPENDENT_AMBULATORY_CARE_PROVIDER_SITE_OTHER): Payer: Medicare Other | Admitting: Family Medicine

## 2014-12-12 ENCOUNTER — Encounter: Payer: Self-pay | Admitting: Family Medicine

## 2014-12-12 ENCOUNTER — Ambulatory Visit (INDEPENDENT_AMBULATORY_CARE_PROVIDER_SITE_OTHER): Payer: Medicare Other

## 2014-12-12 VITALS — BP 120/60 | HR 81 | Temp 98.4°F | Resp 14 | Ht 59.2 in | Wt 106.8 lb

## 2014-12-12 DIAGNOSIS — R531 Weakness: Secondary | ICD-10-CM

## 2014-12-12 DIAGNOSIS — R0789 Other chest pain: Secondary | ICD-10-CM | POA: Diagnosis not present

## 2014-12-12 DIAGNOSIS — M25511 Pain in right shoulder: Secondary | ICD-10-CM | POA: Diagnosis not present

## 2014-12-12 DIAGNOSIS — Z1329 Encounter for screening for other suspected endocrine disorder: Secondary | ICD-10-CM | POA: Diagnosis not present

## 2014-12-12 LAB — COMPREHENSIVE METABOLIC PANEL
ALT: 9 U/L (ref 6–29)
AST: 14 U/L (ref 10–35)
Albumin: 3.9 g/dL (ref 3.6–5.1)
Alkaline Phosphatase: 64 U/L (ref 33–130)
BUN: 16 mg/dL (ref 7–25)
CHLORIDE: 104 mmol/L (ref 98–110)
CO2: 26 mmol/L (ref 20–31)
Calcium: 9.6 mg/dL (ref 8.6–10.4)
Creat: 0.9 mg/dL (ref 0.60–0.93)
GLUCOSE: 106 mg/dL — AB (ref 65–99)
Potassium: 4.3 mmol/L (ref 3.5–5.3)
Sodium: 138 mmol/L (ref 135–146)
Total Bilirubin: 0.8 mg/dL (ref 0.2–1.2)
Total Protein: 6.5 g/dL (ref 6.1–8.1)

## 2014-12-12 LAB — POCT CBC
Granulocyte percent: 77 %G (ref 37–80)
HEMATOCRIT: 42.1 % (ref 37.7–47.9)
HEMOGLOBIN: 13.2 g/dL (ref 12.2–16.2)
Lymph, poc: 1.3 (ref 0.6–3.4)
MCH, POC: 28.6 pg (ref 27–31.2)
MCHC: 31.3 g/dL — AB (ref 31.8–35.4)
MCV: 91.4 fL (ref 80–97)
MID (cbc): 0.9 (ref 0–0.9)
MPV: 7.5 fL (ref 0–99.8)
POC GRANULOCYTE: 7.3 — AB (ref 2–6.9)
POC LYMPH PERCENT: 13.2 %L (ref 10–50)
POC MID %: 9.8 % (ref 0–12)
Platelet Count, POC: 173 10*3/uL (ref 142–424)
RBC: 4.61 M/uL (ref 4.04–5.48)
RDW, POC: 14.6 %
WBC: 8.5 10*3/uL (ref 4.6–10.2)

## 2014-12-12 LAB — GLUCOSE, POCT (MANUAL RESULT ENTRY): POC GLUCOSE: 107 mg/dL — AB (ref 70–99)

## 2014-12-12 LAB — TROPONIN I: Troponin I: 0.03 ng/mL (ref <0.06)

## 2014-12-12 NOTE — Progress Notes (Addendum)
[facility] [address] [place] [address] [phone]  Date:  12/12/2014   Name:  Stephanie Aguilar   DOB:  Sep 27, 1940   MRN:  [id number]  PCP:  Jenny Reichmann, MD    Chief Complaint: Shoulder Pain; Chest Pain; Stress; and Other   History of Present Illness:  [name] is a 74 y.o. very pleasant female patient who presents with the following:  History of anxiety and depression.  Here today with complaint of "I just don't feel good." She has noted a pain in her right shoulder which she thought might be a pinched nerve. She tried some advil She also notes upper chest pain, and sometimes into her right neck. This has been present for 5-7 days.  The pain is non- extertianal- it is associated with movement of her trunk, position change, etc so she suspects it is MSK in origin  She is not aware of any injury  She does not have a sore throat "but inside it feels sore."  "I don't know if there is something bronchial going on" She noted low grade temp up to nearly 100 over the last couple of days  She feels like her carotid bruit is worse when she takes advil.  advil does help with her pain but she does not like to use it for this reason She has been checking her BP and it is normal  She does not have any history of CAD.   No syncope, but she "might feel like the bottom number on my BP is low."    No nausea or vomiting One month ago they took a trip out [place], [place]; she did not have any CP when [place].  In addition she is very active in her garden and exercises regularly; no CP with these activities.   Admits that her family has been under a lot of stress- their son recently had a relapse of his cancer, and their daughter is getting divorced.     Negative nuclear stress in 2011, her cardiologist is Dr. Einar Gip  Patient Active Problem List   Diagnosis Date Noted  . Carotid bruit  07/06/2013  . Hypertension 02/04/2012  . Hyperlipidemia 02/04/2012  . Alopecia areata 02/04/2012  . Menopausal syndrome on hormone replacement therapy 02/04/2012  . Diarrhea 12/10/2011    Past Medical History  Diagnosis Date  . Allergy   . Depression   . Anxiety   . Hypertension     Past Surgical History  Procedure Laterality Date  . Appendectomy    . Breast surgery    . Tonsillectomy and adenoidectomy    . Abdominal hysterectomy      Social History  Substance Use Topics  . Smoking status: Never Smoker   . Smokeless tobacco: None  . Alcohol Use: Yes     Comment: 1-2 oz weekly    Family History  Problem Relation Age of Onset  . Hypertension Mother   . Alcohol abuse Father   . COPD Brother   . Hypertension Brother   . Alcohol abuse Brother   . Heart disease Maternal Grandmother   . Cancer Maternal Grandfather   . Stroke Paternal Grandmother   . Gout Brother   . Alcohol abuse Brother     Allergies  Allergen Reactions  . Erythromycin     Sick on stomach  . Latex   . Lisinopril   . Sulfa  Antibiotics     rash    Medication list has been reviewed and updated.  Current Outpatient Prescriptions on File Prior to Visit  Medication Sig Dispense Refill  . acebutolol (SECTRAL) 200 MG capsule Take one tablet daily 90 capsule 3  . amLODipine (NORVASC) 2.5 MG tablet TAKE 1 TABLET (2.5 MG TOTAL) BY MOUTH DAILY. 90 tablet 3  . aspirin 81 MG tablet Take 81 mg by mouth daily.    . clobetasol (TEMOVATE) 0.05 % external solution Apply 1 application topically 2 (two) times daily. Applied 10 drops to scalp at bedtime 50 mL 3  . cycloSPORINE (RESTASIS) 0.05 % ophthalmic emulsion 1 drop 2 (two) times daily.    Marland Kitchen desloratadine (CLARINEX) 5 MG tablet TAKE 1 TABLET BY MOUTH DAILY AS DIRECTED 90 tablet 3  . doxycycline (VIBRAMYCIN) 100 MG capsule Take 100 mg by mouth daily.    . fluticasone (FLONASE) 50 MCG/ACT nasal spray USE 2 SPRAYS INTO EACH NOSTRIL DAILY 48 g 2  . LORazepam  (ATIVAN) 0.5 MG tablet Take 1 tablet (0.5 mg total) by mouth 2 (two) times daily. 180 tablet 1  . METRONIDAZOLE, TOPICAL, (METROLOTION) 0.75 % LOTN Apply topically to affected area. 1 Bottle 3  . OVER THE COUNTER MEDICATION OTC Imodium taking daily for diarrrhea    . rosuvastatin (CRESTOR) 5 MG tablet Take 0.5 tablets (2.5 mg total) by mouth daily. 45 tablet 3  . valsartan (DIOVAN) 320 MG tablet TAKE 1 TABLET BY MOUTH DAILY 90 tablet 1  . VIVELLE-DOT 0.0375 MG/24HR     . niacin 500 MG tablet Take 500 mg by mouth at bedtime.    . Omega-3 Fatty Acids (OMEGA 3 PO) Take by mouth.     No current facility-administered medications on file prior to visit.    Review of Systems:  As per HPI- otherwise negative.   Physical Examination: Filed Vitals:   12/12/14 0909  BP: 120/60  Pulse: 81  Temp: 98.4 F (36.9 C)  Resp: 14   Filed Vitals:   12/12/14 0909  Height: 4' 11.2" (1.504 m)  Weight: 106 lb 12.8 oz (48.444 kg)   Body mass index is 21.42 kg/(m^2). Ideal Body Weight: Weight in (lb) to have BMI = 25: 124.4  GEN: WDWN, NAD, Non-toxic, A & O x 3, slight build, looks well HEENT: Atraumatic, Normocephalic. Neck supple. No masses, No LAD.  Bilateral TM wnl, oropharynx normal.  PEERL,EOMI.   Ears and Nose: No external deformity. CV: RRR, No M/G/R. No JVD. No thrill. No extra heart sounds. PULM: CTA B, no wheezes, crackles, rhonchi. No retractions. No resp. distress. No accessory muscle use. ABD: S, NT, ND EXTR: No c/c/e NEURO Normal gait.  PSYCH: Normally interactive. Conversant. Not depressed or anxious appearing.  Calm demeanor.  Reproducible tenderness in the right anterior pectoralis muscle, and in the right shoulder.   She has some pain with shoulder abduction, and with palpation of the rotator cuff tendon. Normal ROM of the shoulder Not able to appreciate a significant carotid bruit on ascultation  EKG:  She has flat or downgoing T waves in her chest leads.  However this is  unchanged from previous EKG, no ST elevation or depression  [facility] reading (PRIMARY) by  Dr. Lorelei Pont. CXR: hyperinflation, OW negative   CHEST 2 VIEW  COMPARISON: None.  FINDINGS: The heart size and mediastinal contours are within normal limits. Both lungs are clear. There mild degenerative joint changes of thoracic spine.  IMPRESSION: No active cardiopulmonary disease.  Results  for orders placed or performed in visit on 12/12/14  Troponin I  Result Value Ref Range   Troponin I 0.03 <0.06 ng/mL  POCT CBC  Result Value Ref Range   WBC 8.5 4.6 - 10.2 K/uL   Lymph, poc 1.3 0.6 - 3.4   POC LYMPH PERCENT 13.2 10 - 50 %L   MID (cbc) 0.9 0 - 0.9   POC MID % 9.8 0 - 12 %M   POC Granulocyte 7.3 (A) 2 - 6.9   Granulocyte percent 77.0 37 - 80 %G   RBC 4.61 4.04 - 5.48 M/uL   Hemoglobin 13.2 12.2 - 16.2 g/dL   HCT, POC 42.1 37.7 - 47.9 %   MCV 91.4 80 - 97 fL   MCH, POC 28.6 27 - 31.2 pg   MCHC 31.3 (A) 31.8 - 35.4 g/dL   RDW, POC 14.6 %   Platelet Count, POC 173.0 142 - 424 K/uL   MPV 7.5 0 - 99.8 fL  POCT glucose (manual entry)  Result Value Ref Range   POC Glucose 107 (A) 70 - 99 mg/dl     Assessment and Plan: Other chest pain - Plan: DG Chest 2 View, EKG 12-Lead, Troponin I  Right shoulder pain  Weakness - Plan: POCT CBC, POCT glucose (manual entry), Comprehensive metabolic panel  Screening for hypothyroidism  Discussed with pt and her husband.  Her CP seems to be MSK in origin.  Will run a troponin for further reassurance that there is no acute cardiac pathology.  Assuming that this is negative would recommend that she see her cardiologist Einar Gip) in the next month or so  Pt felt reassured when I called with negative troponin; she plans to follow-up with cardiology if her sx persist.   Signed Lamar Blinks, MD

## 2014-12-12 NOTE — Patient Instructions (Signed)
I will give you a call when your troponin test comes in For the time being try using the advil as needed for your shoulder pain I will touch base with Dr. Einar Gip - they may want to see you follow-up

## 2014-12-13 ENCOUNTER — Telehealth: Payer: Self-pay | Admitting: *Deleted

## 2014-12-13 NOTE — Telephone Encounter (Signed)
Aretha Parrot from [facility] called today stating she called this pt to schedule her a time to come to see Dr. Einar Gip.  However, the pt states according to her knowledge she did not need to be seen by Dr. Einar Gip and she also states that she is feeling better.  Aretha Parrot mentions that she will call the pt once she hears back from [place] to schedule her in.

## 2014-12-20 ENCOUNTER — Other Ambulatory Visit: Payer: Self-pay | Admitting: Emergency Medicine

## 2014-12-20 ENCOUNTER — Ambulatory Visit (INDEPENDENT_AMBULATORY_CARE_PROVIDER_SITE_OTHER): Payer: Medicare Other

## 2014-12-20 DIAGNOSIS — Z23 Encounter for immunization: Secondary | ICD-10-CM

## 2014-12-26 DIAGNOSIS — R0789 Other chest pain: Secondary | ICD-10-CM | POA: Diagnosis not present

## 2015-01-02 ENCOUNTER — Other Ambulatory Visit: Payer: Self-pay | Admitting: Emergency Medicine

## 2015-01-03 ENCOUNTER — Encounter: Payer: Self-pay | Admitting: Emergency Medicine

## 2015-01-11 NOTE — Telephone Encounter (Addendum)
Called her to check in- she did do the stress test afterall, but has not yet received her results.  She is feeling well, and will call for her results if she does not hear back soon

## 2015-01-25 ENCOUNTER — Other Ambulatory Visit: Payer: Self-pay | Admitting: Emergency Medicine

## 2015-02-18 ENCOUNTER — Other Ambulatory Visit: Payer: Self-pay | Admitting: Emergency Medicine

## 2015-03-13 ENCOUNTER — Other Ambulatory Visit: Payer: Self-pay | Admitting: Emergency Medicine

## 2015-03-14 ENCOUNTER — Other Ambulatory Visit: Payer: Self-pay | Admitting: Emergency Medicine

## 2015-03-15 NOTE — Telephone Encounter (Signed)
Faxed

## 2015-03-16 ENCOUNTER — Other Ambulatory Visit: Payer: Self-pay | Admitting: Emergency Medicine

## 2015-03-18 ENCOUNTER — Other Ambulatory Visit: Payer: Self-pay | Admitting: Emergency Medicine

## 2015-03-21 ENCOUNTER — Ambulatory Visit (INDEPENDENT_AMBULATORY_CARE_PROVIDER_SITE_OTHER): Payer: Medicare Other | Admitting: Emergency Medicine

## 2015-03-21 ENCOUNTER — Encounter: Payer: Self-pay | Admitting: Emergency Medicine

## 2015-03-21 VITALS — BP 129/71 | HR 61 | Temp 98.2°F | Resp 16 | Ht 59.0 in | Wt 104.0 lb

## 2015-03-21 DIAGNOSIS — E2839 Other primary ovarian failure: Secondary | ICD-10-CM

## 2015-03-21 DIAGNOSIS — Z780 Asymptomatic menopausal state: Secondary | ICD-10-CM

## 2015-03-21 DIAGNOSIS — R0989 Other specified symptoms and signs involving the circulatory and respiratory systems: Secondary | ICD-10-CM | POA: Diagnosis not present

## 2015-03-21 DIAGNOSIS — I10 Essential (primary) hypertension: Secondary | ICD-10-CM | POA: Diagnosis not present

## 2015-03-21 DIAGNOSIS — Z0000 Encounter for general adult medical examination without abnormal findings: Secondary | ICD-10-CM | POA: Diagnosis not present

## 2015-03-21 DIAGNOSIS — Z9109 Other allergy status, other than to drugs and biological substances: Secondary | ICD-10-CM

## 2015-03-21 DIAGNOSIS — Z13820 Encounter for screening for osteoporosis: Secondary | ICD-10-CM

## 2015-03-21 DIAGNOSIS — E785 Hyperlipidemia, unspecified: Secondary | ICD-10-CM

## 2015-03-21 DIAGNOSIS — Z91048 Other nonmedicinal substance allergy status: Secondary | ICD-10-CM | POA: Diagnosis not present

## 2015-03-21 LAB — POCT URINALYSIS DIP (MANUAL ENTRY)
BILIRUBIN UA: NEGATIVE
Bilirubin, UA: NEGATIVE
GLUCOSE UA: NEGATIVE
Leukocytes, UA: NEGATIVE
Nitrite, UA: NEGATIVE
Protein Ur, POC: NEGATIVE
SPEC GRAV UA: 1.015
UROBILINOGEN UA: 0.2
pH, UA: 5.5

## 2015-03-21 LAB — COMPLETE METABOLIC PANEL WITH GFR
ALBUMIN: 4.2 g/dL (ref 3.6–5.1)
ALK PHOS: 65 U/L (ref 33–130)
ALT: 12 U/L (ref 6–29)
AST: 18 U/L (ref 10–35)
BILIRUBIN TOTAL: 0.5 mg/dL (ref 0.2–1.2)
BUN: 22 mg/dL (ref 7–25)
CALCIUM: 9.4 mg/dL (ref 8.6–10.4)
CO2: 26 mmol/L (ref 20–31)
CREATININE: 1.04 mg/dL — AB (ref 0.60–0.93)
Chloride: 104 mmol/L (ref 98–110)
GFR, Est African American: 62 mL/min (ref >=60)
GFR, Est Non African American: 53 mL/min — ABNORMAL LOW (ref >=60)
Glucose, Bld: 75 mg/dL (ref 65–99)
Potassium: 4.4 mmol/L (ref 3.5–5.3)
Sodium: 140 mmol/L (ref 135–146)
Total Protein: 6.8 g/dL (ref 6.1–8.1)

## 2015-03-21 LAB — CBC WITH DIFFERENTIAL/PLATELET
Basophils Absolute: 0.1 10*3/uL (ref 0.0–0.1)
Basophils Relative: 1 % (ref 0–1)
Eosinophils Absolute: 0.3 10*3/uL (ref 0.0–0.7)
Eosinophils Relative: 4 % (ref 0–5)
HEMATOCRIT: 43.8 % (ref 36.0–46.0)
HEMOGLOBIN: 14.4 g/dL (ref 12.0–15.0)
LYMPHS PCT: 23 % (ref 12–46)
Lymphs Abs: 1.5 10*3/uL (ref 0.7–4.0)
MCH: 30.2 pg (ref 26.0–34.0)
MCHC: 32.9 g/dL (ref 30.0–36.0)
MCV: 91.8 fL (ref 78.0–100.0)
MONO ABS: 0.6 10*3/uL (ref 0.1–1.0)
MPV: 9.7 fL (ref 8.6–12.4)
Monocytes Relative: 9 % (ref 3–12)
NEUTROS ABS: 4.2 10*3/uL (ref 1.7–7.7)
Neutrophils Relative %: 63 % (ref 43–77)
Platelets: 217 10*3/uL (ref 150–400)
RBC: 4.77 MIL/uL (ref 3.87–5.11)
RDW: 13.8 % (ref 11.5–15.5)
WBC: 6.6 10*3/uL (ref 4.0–10.5)

## 2015-03-21 LAB — LIPID PANEL
CHOL/HDL RATIO: 2.5 ratio (ref <=5.0)
CHOLESTEROL: 133 mg/dL (ref 125–200)
HDL: 54 mg/dL (ref >=46)
LDL CALC: 45 mg/dL (ref <130)
TRIGLYCERIDES: 168 mg/dL — AB (ref <150)
VLDL: 34 mg/dL — AB (ref <30)

## 2015-03-21 LAB — TSH: TSH: 4.288 u[IU]/mL (ref 0.350–4.500)

## 2015-03-21 MED ORDER — ACEBUTOLOL HCL 200 MG PO CAPS: ORAL_CAPSULE | ORAL | Status: DC

## 2015-03-21 MED ORDER — VALSARTAN 320 MG PO TABS: 320.0000 mg | ORAL_TABLET | Freq: Every day | ORAL | Status: DC

## 2015-03-21 MED ORDER — LORAZEPAM 0.5 MG PO TABS: 0.5000 mg | ORAL_TABLET | Freq: Two times a day (BID) | ORAL | Status: DC

## 2015-03-21 MED ORDER — ROSUVASTATIN CALCIUM 5 MG PO TABS
2.5000 mg | ORAL_TABLET | Freq: Every day | ORAL | Status: DC
Start: 2015-03-21 — End: 2015-09-21

## 2015-03-21 NOTE — Progress Notes (Addendum)
Subjective:  This chart was scribed for Stephanie Russian, MD by Tamsen Roers, at [facility].  This patient was seen in room 22 and the patient's care was started at 8:43 AM.    Patient ID: [name], female    DOB: 05-Oct-1940, 74 y.o.   MRN: [id number] Chief Complaint  Patient presents with  . Follow-up  . Hypertension    HPI  HPI Comments: Stephanie Aguilar is a 74 y.o. female with a history of hypertension who presents to the [facility] for an annual physical exam.   Eye: Patient is seeing Dr. Carolynn Sayers for surgery in the future. She states that she is having more difficulty with her vision at night time.   Shoulder/neck: Patient is complaining of right shoulder/neck pain and states that it has been going on for the past year.  She is willing to see a doctor regarding the issue if it happens to worsen.   Heart: Patient has had a stress test done and is assuming that there was not any significant findings. Her official heart doctor is Dr. Einar Gip.  Her last carotid was 2 years ago.   Vaccinations: Patient is up to date with her flu shot and pneumonia vaccination.   GI: She denies any recent episodes with diarrhea as she has realized that she is lactose intolerant.      Patient Active Problem List   Diagnosis Date Noted  . Carotid bruit 07/06/2013  . Hypertension 02/04/2012  . Hyperlipidemia 02/04/2012  . Alopecia areata 02/04/2012  . Menopausal syndrome on hormone replacement therapy 02/04/2012  . Diarrhea 12/10/2011   Past Medical History  Diagnosis Date  . Allergy   . Depression   . Anxiety   . Hypertension   . Cataract   . Hyperlipidemia    Past Surgical History  Procedure Laterality Date  . Appendectomy    . Breast surgery    . Tonsillectomy and adenoidectomy    . Abdominal hysterectomy     Allergies  Allergen Reactions  . Codeine   . Erythromycin     Sick on stomach  . Latex   . Lisinopril     . Sulfa Antibiotics     rash   Prior to Admission medications   Medication Sig Start Date End Date Taking? Authorizing Provider  acebutolol (SECTRAL) 200 MG capsule TAKE 1 CAPSULE BY MOUTH DAILY. 12/20/14   [name], PA-C  amLODipine (NORVASC) 2.5 MG tablet TAKE 1 TABLET (2.5 MG TOTAL) BY MOUTH DAILY. 09/20/14   Stephanie Russian, MD  aspirin 81 MG tablet Take 81 mg by mouth daily.    Historical Provider, MD  clobetasol (TEMOVATE) 0.05 % external solution Apply 10 drops to scalp every night at bedtime. DISCUSS W/DR [name] AT NEXT OV MORE MORE REFILLS 02/20/15   Stephanie Russian, MD  cycloSPORINE (RESTASIS) 0.05 % ophthalmic emulsion 1 drop 2 (two) times daily.    Historical Provider, MD  desloratadine (CLARINEX) 5 MG tablet TAKE 1 TABLET BY MOUTH DAILY AS DIRECTED 09/20/14   Stephanie Russian, MD  doxycycline (VIBRAMYCIN) 100 MG capsule Take 100 mg by mouth daily.    Historical Provider, MD  fluticasone (FLONASE) 50 MCG/ACT nasal spray USE 2 SPRAYS INTO EACH NOSTRIL DAILY 09/20/14   Stephanie Russian, MD  LORazepam (ATIVAN) 0.5 MG tablet TAKE 1 TABLET BY MOUTH TWICE DAILY 03/15/15   Stephanie Russian, MD  METRONIDAZOLE, TOPICAL, 0.75 % LOTN APPLY TOPICALLY  TO AFFECTED AREA. 01/04/15   Stephanie Russian, MD  niacin 500 MG tablet Take 500 mg by mouth at bedtime.    Historical Provider, MD  Omega-3 Fatty Acids (OMEGA 3 PO) Take by mouth.    Historical Provider, MD  OVER THE COUNTER MEDICATION OTC Imodium taking daily for diarrrhea    Historical Provider, MD  rosuvastatin (CRESTOR) 5 MG tablet Take 0.5 tablets (2.5 mg total) by mouth daily. 09/20/14   Stephanie Russian, MD  valsartan (DIOVAN) 320 MG tablet TAKE 1 TABLET BY MOUTH DAILY 01/26/15   Darreld Mclean, MD  VIVELLE-DOT 0.0375 MG/24HR  09/05/14   Historical Provider, MD   Social History   Social History  . Marital Status: Single    Spouse Name: N/A  . Number of Children: N/A  . Years of Education: N/A   Occupational History  . retired/homemaker    Social  History Main Topics  . Smoking status: Never Smoker   . Smokeless tobacco: Not on file  . Alcohol Use: Yes     Comment: 1-2 oz weekly  . Drug Use: No  . Sexual Activity: Not on file   Other Topics Concern  . Not on file   Social History Narrative        Review of Systems  Constitutional: Negative for fever and chills.  Eyes: Positive for visual disturbance. Negative for pain, redness and itching.  Respiratory: Negative for cough, choking and shortness of breath.   Gastrointestinal: Negative for nausea and vomiting.  Musculoskeletal: Positive for myalgias and neck pain. Negative for neck stiffness.  Neurological: Negative for syncope and speech difficulty.       Objective:   Physical Exam  Filed Vitals:   03/21/15 0823  BP: 129/71  Pulse: 61  Temp: 98.2 F (36.8 C)  Resp: 16  Height: 4\' 11"  (1.499 m)  Weight: 104 lb (47.174 kg)     CONSTITUTIONAL: Well developed/well nourished HEAD: Normocephalic/atraumatic EYES: EOMI/PERRL ENMT: Mucous membranes moist NECK: supple no meningeal signs SPINE/BACK:entire spine nontender CV: She has a faint right carotid bruit LUNGS: Lungs are clear to auscultation bilaterally, no apparent distress ABDOMEN: soft, nontender, no rebound or guarding, bowel sounds noted throughout abdomen GU:no cva tenderness NEURO: Pt is awake/alert/appropriate, moves all extremitiesx4.  No facial droop.   EXTREMITIES: pulses normal/equal, full ROM, A prominence of the right AC joint with pain on elevation of the shoulder.  SKIN: warm, color normal PSYCH: no abnormalities of mood noted, alert and oriented to situation Results for orders placed or performed in visit on 03/21/15  POCT urinalysis dipstick  Result Value Ref Range   Color, UA yellow yellow   Clarity, UA clear clear   Glucose, UA negative negative   Bilirubin, UA negative negative   Ketones, POC UA negative negative   Spec Grav, UA 1.015    Blood, UA trace-lysed (A) negative   pH,  UA 5.5    Protein Ur, POC negative negative   Urobilinogen, UA 0.2    Nitrite, UA Negative Negative   Leukocytes, UA Negative Negative        Assessment & Plan:  Patient looks great. She has a very healthy lifestyle. Blood pressure is at goal. She does have a carotid bruit which is followed by Dr.Ganji. She had a stress test done in September and was told this was clear. I will get the results of this to be sure.I personally performed the services described in this documentation, which was scribed in my presence.  The recorded information has been reviewed and is accurate. Iline Oven.D.

## 2015-04-07 ENCOUNTER — Other Ambulatory Visit: Payer: Self-pay | Admitting: Emergency Medicine

## 2015-04-10 ENCOUNTER — Other Ambulatory Visit: Payer: Self-pay

## 2015-04-10 NOTE — Telephone Encounter (Signed)
Dr Everlene Farrier, on pt's last RF I put message advising her to discuss this med at next OV w/you. She had a CPE last month, but doesn't look like she discussed w/you. OK to give RFs? You have been Rxing for pt's alopecia.

## 2015-04-14 ENCOUNTER — Other Ambulatory Visit: Payer: Self-pay | Admitting: Emergency Medicine

## 2015-04-17 ENCOUNTER — Encounter: Payer: Self-pay | Admitting: Family Medicine

## 2015-04-21 ENCOUNTER — Ambulatory Visit
Admission: RE | Admit: 2015-04-21 | Discharge: 2015-04-21 | Disposition: A | Discharge status: Home / Self Care | Payer: Medicare Other | Source: Ambulatory Visit | Attending: Emergency Medicine | Admitting: Emergency Medicine

## 2015-04-21 DIAGNOSIS — R0989 Other specified symptoms and signs involving the circulatory and respiratory systems: Secondary | ICD-10-CM

## 2015-04-21 DIAGNOSIS — I6523 Occlusion and stenosis of bilateral carotid arteries: Secondary | ICD-10-CM | POA: Diagnosis not present

## 2015-06-07 ENCOUNTER — Other Ambulatory Visit: Payer: Self-pay | Admitting: *Deleted

## 2015-06-07 LAB — POC HEMOCCULT BLD/STL (HOME/3-CARD/SCREEN)
Card #3 Fecal Occult Blood, POC: NEGATIVE
FECAL OCCULT BLD: NEGATIVE
Fecal Occult Blood, POC: NEGATIVE

## 2015-06-07 NOTE — Addendum Note (Signed)
Addended by: Burnis Kingfisher on: 06/07/2015 01:52 PM   Modules accepted: Orders

## 2015-06-14 DIAGNOSIS — H40013 Open angle with borderline findings, low risk, bilateral: Secondary | ICD-10-CM | POA: Diagnosis not present

## 2015-06-14 DIAGNOSIS — H2513 Age-related nuclear cataract, bilateral: Secondary | ICD-10-CM | POA: Diagnosis not present

## 2015-06-14 DIAGNOSIS — H16223 Keratoconjunctivitis sicca, not specified as Sjogren's, bilateral: Secondary | ICD-10-CM | POA: Diagnosis not present

## 2015-08-07 DIAGNOSIS — H2512 Age-related nuclear cataract, left eye: Secondary | ICD-10-CM | POA: Diagnosis not present

## 2015-08-07 DIAGNOSIS — H04122 Dry eye syndrome of left lacrimal gland: Secondary | ICD-10-CM | POA: Diagnosis not present

## 2015-08-10 DIAGNOSIS — H2511 Age-related nuclear cataract, right eye: Secondary | ICD-10-CM | POA: Diagnosis not present

## 2015-08-14 DIAGNOSIS — H2511 Age-related nuclear cataract, right eye: Secondary | ICD-10-CM | POA: Diagnosis not present

## 2015-09-19 ENCOUNTER — Other Ambulatory Visit: Payer: Self-pay | Admitting: Emergency Medicine

## 2015-09-19 ENCOUNTER — Ambulatory Visit: Payer: Medicare Other | Admitting: Emergency Medicine

## 2015-09-20 NOTE — Telephone Encounter (Signed)
Faxed

## 2015-09-21 ENCOUNTER — Ambulatory Visit (INDEPENDENT_AMBULATORY_CARE_PROVIDER_SITE_OTHER): Payer: Medicare Other | Admitting: Emergency Medicine

## 2015-09-21 ENCOUNTER — Encounter: Payer: Self-pay | Admitting: Emergency Medicine

## 2015-09-21 VITALS — BP 140/80 | HR 64 | Temp 98.2°F | Resp 16 | Ht 59.5 in | Wt 103.8 lb

## 2015-09-21 DIAGNOSIS — F411 Generalized anxiety disorder: Secondary | ICD-10-CM

## 2015-09-21 DIAGNOSIS — R0989 Other specified symptoms and signs involving the circulatory and respiratory systems: Secondary | ICD-10-CM

## 2015-09-21 DIAGNOSIS — I10 Essential (primary) hypertension: Secondary | ICD-10-CM

## 2015-09-21 DIAGNOSIS — L708 Other acne: Secondary | ICD-10-CM | POA: Diagnosis not present

## 2015-09-21 DIAGNOSIS — E785 Hyperlipidemia, unspecified: Secondary | ICD-10-CM

## 2015-09-21 LAB — CBC
HEMATOCRIT: 43.4 % (ref 35.0–45.0)
HEMOGLOBIN: 14.3 g/dL (ref 11.7–15.5)
MCH: 30 pg (ref 27.0–33.0)
MCHC: 32.9 g/dL (ref 32.0–36.0)
MCV: 91.2 fL (ref 80.0–100.0)
MPV: 10.4 fL (ref 7.5–12.5)
Platelets: 174 10*3/uL (ref 140–400)
RBC: 4.76 MIL/uL (ref 3.80–5.10)
RDW: 13.8 % (ref 11.0–15.0)
WBC: 6.5 10*3/uL (ref 3.8–10.8)

## 2015-09-21 LAB — BASIC METABOLIC PANEL WITH GFR
BUN: 19 mg/dL (ref 7–25)
CALCIUM: 9.3 mg/dL (ref 8.6–10.4)
CO2: 26 mmol/L (ref 20–31)
Chloride: 105 mmol/L (ref 98–110)
Creat: 0.97 mg/dL — ABNORMAL HIGH (ref 0.60–0.93)
GFR, EST AFRICAN AMERICAN: 67 mL/min (ref >=60)
GFR, EST NON AFRICAN AMERICAN: 58 mL/min — AB (ref >=60)
Glucose, Bld: 74 mg/dL (ref 65–99)
POTASSIUM: 4.2 mmol/L (ref 3.5–5.3)
SODIUM: 139 mmol/L (ref 135–146)

## 2015-09-21 LAB — LIPID PANEL
CHOL/HDL RATIO: 2.2 ratio (ref <=5.0)
CHOLESTEROL: 137 mg/dL (ref 125–200)
HDL: 61 mg/dL (ref >=46)
LDL Cholesterol: 35 mg/dL (ref <130)
TRIGLYCERIDES: 203 mg/dL — AB (ref <150)
VLDL: 41 mg/dL — ABNORMAL HIGH (ref <30)

## 2015-09-21 MED ORDER — ROSUVASTATIN CALCIUM 5 MG PO TABS: 2.5000 mg | ORAL_TABLET | Freq: Every day | ORAL | Status: DC

## 2015-09-21 MED ORDER — VALSARTAN 320 MG PO TABS: 320.0000 mg | ORAL_TABLET | Freq: Every day | ORAL | Status: DC

## 2015-09-21 MED ORDER — LORAZEPAM 0.5 MG PO TABS
0.5000 mg | ORAL_TABLET | Freq: Two times a day (BID) | ORAL | Status: DC
Start: 2015-09-21 — End: 2016-09-04

## 2015-09-21 MED ORDER — AMLODIPINE BESYLATE 2.5 MG PO TABS: 2.5000 mg | ORAL_TABLET | Freq: Every day | ORAL | Status: DC

## 2015-09-21 MED ORDER — METRONIDAZOLE 0.75 % EX LOTN
TOPICAL_LOTION | CUTANEOUS | Status: AC
Start: 2015-09-21 — End: ?

## 2015-09-21 NOTE — Patient Instructions (Signed)
     IF you received an x-ray today, you will receive an invoice from [facility]. Please contact [facility] at [phone] with questions or concerns regarding your invoice.   IF you received labwork today, you will receive an invoice from [facility]. Please contact [facility] at [phone] with questions or concerns regarding your invoice.   Our billing staff will not be able to assist you with questions regarding bills from these companies.  You will be contacted with the lab results as soon as they are available. The fastest way to get your results is to activate your My Chart account. Instructions are located on the last page of this paperwork. If you have not heard from us regarding the results in 2 weeks, please contact this office.      

## 2015-09-21 NOTE — Progress Notes (Signed)
By signing my name below, I, Stephanie Aguilar, attest that this documentation has been prepared under the direction and in the presence of Stephanie Queen, MD. Electronically Signed: Moises Aguilar, [place]. 09/21/2015 , 9:26 AM .  Patient was seen in room 21 .  Chief Complaint:  Chief Complaint  Patient presents with  . Follow-up    6 MONTH with labwork  . Medication Refill    HPI: Stephanie Aguilar is a 75 y.o. female who reports to [facility] today for follow up.  She's generally doing well. Her children are planning a trip to [place]. She plans to stay at home and take care of all the pets, by her choice. She requested medication refill due to changes with pharmacy regulations.   She recently had cataracts surgery. She received her glasses 6 days ago.   She had a myocardial perfusion imaging done by Dr. Einar Gip done in Sept 2016. It was normal.   Past Medical History  Diagnosis Date  . Allergy   . Depression   . Anxiety   . Hypertension   . Cataract   . Hyperlipidemia    Past Surgical History  Procedure Laterality Date  . Appendectomy    . Breast surgery    . Tonsillectomy and adenoidectomy    . Abdominal hysterectomy     Social History   Social History  . Marital Status: Single    Spouse Name: N/A  . Number of Children: N/A  . Years of Education: N/A   Occupational History  . retired/homemaker    Social History Main Topics  . Smoking status: Never Smoker   . Smokeless tobacco: None  . Alcohol Use: Yes     Comment: 1-2 oz weekly  . Drug Use: No  . Sexual Activity: No   Other Topics Concern  . None   Social History Narrative   Family History  Problem Relation Age of Onset  . Hypertension Mother   . Alcohol abuse Father   . COPD Brother   . Hypertension Brother   . Alcohol abuse Brother   . Heart disease Maternal Grandmother   . Cancer Maternal Grandfather   . Stroke Paternal Grandmother   . Gout Brother   . Alcohol abuse Brother    Allergies    Allergen Reactions  . Codeine   . Erythromycin     Sick on stomach  . Latex   . Lisinopril   . Sulfa Antibiotics     rash   Prior to Admission medications   Medication Sig Start Date End Date Taking? Authorizing Provider  acebutolol (SECTRAL) 200 MG capsule TAKE 1 CAPSULE BY MOUTH DAILY. 03/21/15   Darlyne Russian, MD  amLODipine (NORVASC) 2.5 MG tablet TAKE 1 TABLET BY MOUTH DAILY 09/19/15   Darlyne Russian, MD  aspirin 81 MG tablet Take 81 mg by mouth daily.    Historical Provider, MD  clobetasol (TEMOVATE) 0.05 % external solution Apply 10 drops to scalp every night at bedtime. 04/11/15   Darlyne Russian, MD  cycloSPORINE (RESTASIS) 0.05 % ophthalmic emulsion 1 drop 2 (two) times daily.    Historical Provider, MD  desloratadine (CLARINEX) 5 MG tablet TAKE 1 TABLET BY MOUTH DAILY AS DIRECTED 09/20/14   Darlyne Russian, MD  fluticasone [facility]) 50 MCG/ACT nasal spray USE 2 SPRAYS INTO EACH NOSTRIL DAILY 09/20/14   Darlyne Russian, MD  LORazepam (ATIVAN) 0.5 MG tablet TAKE 1 TABLET BY MOUTH TWICE DAILY 09/20/15   Loura Back  Ryelle Ruvalcaba, MD  METRONIDAZOLE, TOPICAL, 0.75 % LOTN APPLY TOPICALLY TO AFFECTED AREA. 01/04/15   Darlyne Russian, MD  OVER THE COUNTER MEDICATION OTC Imodium taking daily for diarrrhea    Historical Provider, MD  rosuvastatin (CRESTOR) 5 MG tablet Take 2.5 mg by mouth daily.    Historical Provider, MD  rosuvastatin (CRESTOR) 5 MG tablet Take 0.5 tablets (2.5 mg total) by mouth daily. 03/21/15   Darlyne Russian, MD  valsartan (DIOVAN) 320 MG tablet Take 1 tablet (320 mg total) by mouth daily. 03/21/15   Darlyne Russian, MD  valsartan (DIOVAN) 320 MG tablet TAKE 1 TABLET BY MOUTH DAILY 04/18/15   Darlyne Russian, MD  VIVELLE-DOT 0.0375 MG/24HR  09/05/14   Historical Provider, MD     ROS:  Constitutional: negative for fever, chills, night sweats, weight changes, or fatigue  HEENT: negative for vision changes, hearing loss, congestion, rhinorrhea, ST, epistaxis, or sinus pressure Cardiovascular:  negative for chest pain or palpitations Respiratory: negative for hemoptysis, wheezing, shortness of breath, or cough Abdominal: negative for abdominal pain, nausea, vomiting, diarrhea, or constipation Dermatological: negative for rash Neurologic: negative for headache, dizziness, or syncope All other systems reviewed and are otherwise negative with the exception to those above and in the HPI.  PHYSICAL EXAM: Filed Vitals:   09/21/15 0903  BP: 140/80  Pulse: 64  Temp: 98.2 F (36.8 C)  Resp: 16   Body mass index is 20.62 kg/(m^2).   General: Alert, no acute distress  HEENT:  Normocephalic, atraumatic, oropharynx patent. Eye: [name] [facility] Cardiovascular:  Regular rate and rhythm; faint left carotid bruit and a loud right carotid bruit, no rubs murmurs or gallops.  No radial pulse intact. No pedal edema.  Respiratory: Clear to auscultation bilaterally.  No wheezes, rales, or rhonchi.  No cyanosis, no use of accessory musculature Abdominal: No organomegaly, abdomen is soft and non-tender, positive bowel sounds. No masses. Musculoskeletal: Gait intact. No edema, tenderness Skin: No rashes. Neurologic: Facial musculature symmetric. Psychiatric: Patient acts appropriately throughout our interaction.  Lymphatic: No cervical or submandibular lymphadenopathy Genitourinary/Anorectal: No acute findings BP recheck in room, sitting (left arm, manual): 148/70  LABS:   EKG/XRAY:     ASSESSMENT/PLAN: Patient is doing very well. Medications were all refilled. All medical problems are stable at the present time. No changes to be made.I personally performed the services described in this documentation, which was scribed in my presence. The recorded information has been reviewed and is accurate. Carotid studies done showed no obstruction. This should be repeated in about 2 years.  Gross sideeffects, risk and benefits, and alternatives of medications d/w patient. Patient is aware that all  medications have potential sideeffects and we are unable to predict every sideeffect or drug-drug interaction that may occur.  Stephanie Queen MD 09/21/2015 9:07 AM

## 2015-09-22 ENCOUNTER — Telehealth: Payer: Self-pay

## 2015-09-22 NOTE — Telephone Encounter (Signed)
Pt is needing a 90 suppy of acebutolol sent to [facility] -she states that she changed pharmacy and that dr Everlene Farrier forgot to send this one and it is ok to have this put on hold at pharmacy patient just wants it at pharmacy when she is ready  Best number [phone]

## 2015-09-25 ENCOUNTER — Telehealth: Payer: Self-pay | Admitting: Emergency Medicine

## 2015-09-25 MED ORDER — ACEBUTOLOL HCL 200 MG PO CAPS: ORAL_CAPSULE | ORAL | Status: DC

## 2015-09-25 NOTE — Telephone Encounter (Signed)
Pt made aware Rx Acebutolol reordered and e-scribed to [facility] per Dr. Everlene Farrier

## 2015-09-28 NOTE — Telephone Encounter (Signed)
Duplicate message. Done. 

## 2015-10-19 DIAGNOSIS — Z1231 Encounter for screening mammogram for malignant neoplasm of breast: Secondary | ICD-10-CM | POA: Diagnosis not present

## 2015-10-19 DIAGNOSIS — Z01419 Encounter for gynecological examination (general) (routine) without abnormal findings: Secondary | ICD-10-CM | POA: Diagnosis not present

## 2015-10-19 DIAGNOSIS — Z124 Encounter for screening for malignant neoplasm of cervix: Secondary | ICD-10-CM | POA: Diagnosis not present

## 2015-11-07 ENCOUNTER — Encounter: Payer: Self-pay | Admitting: Family Medicine

## 2015-11-07 ENCOUNTER — Ambulatory Visit (INDEPENDENT_AMBULATORY_CARE_PROVIDER_SITE_OTHER): Payer: Medicare Other | Admitting: Family Medicine

## 2015-11-07 VITALS — BP 122/80 | HR 70 | Resp 12 | Ht 59.5 in | Wt 104.0 lb

## 2015-11-07 DIAGNOSIS — L719 Rosacea, unspecified: Secondary | ICD-10-CM | POA: Insufficient documentation

## 2015-11-07 DIAGNOSIS — I10 Essential (primary) hypertension: Secondary | ICD-10-CM

## 2015-11-07 DIAGNOSIS — J309 Allergic rhinitis, unspecified: Secondary | ICD-10-CM | POA: Diagnosis not present

## 2015-11-07 DIAGNOSIS — L309 Dermatitis, unspecified: Secondary | ICD-10-CM | POA: Insufficient documentation

## 2015-11-07 DIAGNOSIS — F419 Anxiety disorder, unspecified: Secondary | ICD-10-CM

## 2015-11-07 MED ORDER — DESLORATADINE 5 MG PO TABS: ORAL_TABLET | ORAL | 2 refills | Status: DC

## 2015-11-07 NOTE — Progress Notes (Signed)
Pre visit review using our clinic review tool, if applicable. No additional management support is needed unless otherwise documented below in the visit note. 

## 2015-11-07 NOTE — Patient Instructions (Signed)
A few things to remember from today's visit:   Essential hypertension  Allergic rhinitis, unspecified allergic rhinitis type - Plan: desloratadine (CLARINEX) 5 MG tablet    Medicare covers a annual preventive visit, which is strongly recommended , it is once per year and involves a series of questions to identify risk factors; so we can try to prevent possible complications. This does not need to be done by a doctor.  We have a nurse Investment banker, corporate) here that is highly qualified to do it, it can be arrange same date you have a follow up appointment with me or labs scheduled, and it 100% covered by Medicare. So before you leave today I would like for you to arrange visit with Ms Stephanie Aguilar for Medicare wellness visit.    Please be sure medication list is accurate. If a new problem present, please set up appointment sooner than planned today.

## 2015-11-07 NOTE — Progress Notes (Signed)
HPI:   Stephanie Aguilar is a 75 y.o. female, who is here today to establish care with me.  Former PCP: Dr Everlene Farrier. Last preventive routine visit: 03/2015.  Concerns today: allergies. Hx of allergic rhinitis, she was on Clarinex before and now on OTC Claritin, which does not seem to be helping as well as Clarinex did. She is also on Flonase nasal spray. Symptoms are otherwise well controlled as far as she takes medication daily. + Rhinorrhea and post nasal drainage, worse in Spring  She follows with cardiologists annually, reports having a stress test less than a year ago and negative.  -Hx of HLD, mainly elevated TG. She is on Crestor 5 mg. She states that higher dose will decrease cholesterol "too low" but no TG. She follows a healthy diet and exercises regularly. She lives with husband and 41 yo son (trisomy 90).  -Anxiety, which was worse at the time her son graduated from high school, she was concerned about his future. Stable otherwise. She takes Lorazepam 0.5 mg at bedtime daily and an extra during the day as needed.  Hypertension: Currently she is on Diovan 320 mg daily, Acebutolol 200 mg qd, and Amlodipine 2.5 mg daily. . She is taking medications as instructed, no side effects reported.  She has not noted unusual headache, visual changes, exertional chest pain, dyspnea,  focal weakness, or edema.    Lab Results  Component Value Date   CREATININE 0.97 (H) 09/21/2015   BUN 19 09/21/2015   NA 139 09/21/2015   K 4.2 09/21/2015   CL 105 09/21/2015   CO2 26 09/21/2015    Also Hx of eczema (back and scalp), has seen dermatology in the past and started on Clobetasol cream, which she uses as needed.  Rosacea and on HT (S/P hysterectomy).   Review of Systems  Constitutional: Negative for activity change, appetite change, fatigue, fever and unexpected weight change.  HENT: Positive for postnasal drip and rhinorrhea. Negative for congestion, mouth sores,  nosebleeds and trouble swallowing.   Eyes: Negative for redness and visual disturbance.  Respiratory: Negative for cough, shortness of breath and wheezing.   Cardiovascular: Negative for chest pain, palpitations and leg swelling.  Gastrointestinal: Negative for abdominal pain, nausea and vomiting.       Negative for changes in bowel habits.  Genitourinary: Negative for decreased urine volume, difficulty urinating and hematuria.  Skin: Negative for pallor and rash.  Allergic/Immunologic: Positive for environmental allergies.  Neurological: Negative for seizures, syncope, weakness, numbness and headaches.  Psychiatric/Behavioral: Negative for confusion and sleep disturbance. The patient is nervous/anxious.       Current Outpatient Prescriptions on File Prior to Visit  Medication Sig Dispense Refill  . acebutolol (SECTRAL) 200 MG capsule TAKE 1 CAPSULE BY MOUTH DAILY. 90 capsule 3  . amLODipine (NORVASC) 2.5 MG tablet Take 1 tablet (2.5 mg total) by mouth daily. 90 tablet 3  . aspirin 81 MG tablet Take 81 mg by mouth daily.    . clobetasol (TEMOVATE) 0.05 % external solution Apply 10 drops to scalp every night at bedtime. 50 mL 11  . cycloSPORINE (RESTASIS) 0.05 % ophthalmic emulsion 1 drop 2 (two) times daily.    . fluticasone (FLONASE) 50 MCG/ACT nasal spray USE 2 SPRAYS INTO EACH NOSTRIL DAILY 48 g 2  . LORazepam (ATIVAN) 0.5 MG tablet Take 1 tablet (0.5 mg total) by mouth 2 (two) times daily. 180 tablet 1  . METRONIDAZOLE, TOPICAL, 0.75 % LOTN APPLY TOPICALLY  TO AFFECTED AREA. 59 mL 11  . OVER THE COUNTER MEDICATION OTC Imodium taking daily for diarrrhea    . rosuvastatin (CRESTOR) 5 MG tablet Take 0.5 tablets (2.5 mg total) by mouth daily. 45 tablet 3  . valsartan (DIOVAN) 320 MG tablet Take 1 tablet (320 mg total) by mouth daily. 90 tablet 3  . VIVELLE-DOT 0.0375 MG/24HR      No current facility-administered medications on file prior to visit.      Past Medical History:    Diagnosis Date  . Allergy   . Anxiety   . Cataract   . Depression   . Hyperlipidemia   . Hypertension    Allergies  Allergen Reactions  . Codeine   . Erythromycin     Sick on stomach  . Latex   . Lisinopril   . Sulfa Antibiotics     rash    Family History  Problem Relation Age of Onset  . Hypertension Mother   . Alcohol abuse Father   . COPD Brother   . Hypertension Brother   . Alcohol abuse Brother   . Heart disease Maternal Grandmother   . Cancer Maternal Grandfather   . Stroke Paternal Grandmother   . Gout Brother   . Alcohol abuse Brother     Social History   Social History  . Marital status: Single    Spouse name: N/A  . Number of children: N/A  . Years of education: N/A   Occupational History  . retired/homemaker    Social History Main Topics  . Smoking status: Never Smoker  . Smokeless tobacco: None  . Alcohol use Yes     Comment: 1-2 oz weekly  . Drug use: No  . Sexual activity: No   Other Topics Concern  . None   Social History Narrative  . None    Vitals:   11/07/15 1310  BP: 122/80  Pulse: 70    Body mass index is 20.65 kg/m.      Physical Exam  Constitutional: She is oriented to person, place, and time. She appears well-developed and well-nourished. No distress.  HENT:  Head: Atraumatic.  Eyes: Conjunctivae and EOM are normal. Pupils are equal, round, and reactive to light.  Cardiovascular: Normal rate and regular rhythm.   Murmur (soft RUSB SEM) heard. Pulses:      Dorsalis pedis pulses are 2+ on the right side, and 2+ on the left side.  Respiratory: Effort normal and breath sounds normal. No respiratory distress.  GI: Soft. She exhibits no mass. There is no tenderness.  Musculoskeletal: She exhibits no edema.  Lymphadenopathy:    She has no cervical adenopathy.  Neurological: She is alert and oriented to person, place, and time. She has normal strength. Coordination normal.  Skin: Skin is warm. No erythema.   Psychiatric: Her speech is normal. Her mood appears anxious.  Well groomed, good eye contact.      ASSESSMENT AND PLAN:     [name] was seen today for new patient (initial visit).  Diagnoses and all orders for this visit:  Essential hypertension  Adequately controlled. No changes in current management. Low salt diet recommended. Eye exam annually to continue. F/U in 6 months, before if needed.  Allergic rhinitis, unspecified allergic rhinitis type  Stop Claritin OTC and resume Clarinex 5 mg daily. Continue Flonase nasal spray OTC, some side effects discussed. F/U as needed.  -     desloratadine (CLARINEX) 5 MG tablet; TAKE 1 TABLET BY MOUTH DAILY AS  DIRECTED  Anxiety disorder, unspecified  Stable. She does not need refills at this time, she tells me that she has plenty or refills for now. I will see her back in January or 05/2016, before if needed.          Jamarii Banks G. [place], MD  [facility]. [place] office.

## 2015-11-08 NOTE — Progress Notes (Signed)
Thank you so for seeing Stephanie Aguilar. She is so nice and everyone in her family is extremely nice. They are a pleasure to care for

## 2015-11-20 ENCOUNTER — Encounter: Payer: Self-pay | Admitting: Emergency Medicine

## 2016-01-01 ENCOUNTER — Ambulatory Visit (INDEPENDENT_AMBULATORY_CARE_PROVIDER_SITE_OTHER): Payer: Medicare Other | Admitting: Urgent Care

## 2016-01-01 DIAGNOSIS — Z23 Encounter for immunization: Secondary | ICD-10-CM

## 2016-01-01 NOTE — Progress Notes (Signed)
Patient presented today for flu shot onlyjp/cma

## 2016-03-07 DIAGNOSIS — H16223 Keratoconjunctivitis sicca, not specified as Sjogren's, bilateral: Secondary | ICD-10-CM | POA: Diagnosis not present

## 2016-03-07 DIAGNOSIS — H01024 Squamous blepharitis left upper eyelid: Secondary | ICD-10-CM | POA: Diagnosis not present

## 2016-03-07 DIAGNOSIS — H01025 Squamous blepharitis left lower eyelid: Secondary | ICD-10-CM | POA: Diagnosis not present

## 2016-03-07 DIAGNOSIS — H01022 Squamous blepharitis right lower eyelid: Secondary | ICD-10-CM | POA: Diagnosis not present

## 2016-03-07 DIAGNOSIS — H01021 Squamous blepharitis right upper eyelid: Secondary | ICD-10-CM | POA: Diagnosis not present

## 2016-03-07 DIAGNOSIS — Z961 Presence of intraocular lens: Secondary | ICD-10-CM | POA: Diagnosis not present

## 2016-04-09 DIAGNOSIS — H04123 Dry eye syndrome of bilateral lacrimal glands: Secondary | ICD-10-CM | POA: Diagnosis not present

## 2016-04-09 DIAGNOSIS — H01022 Squamous blepharitis right lower eyelid: Secondary | ICD-10-CM | POA: Diagnosis not present

## 2016-04-09 DIAGNOSIS — H26493 Other secondary cataract, bilateral: Secondary | ICD-10-CM | POA: Diagnosis not present

## 2016-04-09 DIAGNOSIS — H01024 Squamous blepharitis left upper eyelid: Secondary | ICD-10-CM | POA: Diagnosis not present

## 2016-04-09 DIAGNOSIS — Z961 Presence of intraocular lens: Secondary | ICD-10-CM | POA: Diagnosis not present

## 2016-04-09 DIAGNOSIS — H01025 Squamous blepharitis left lower eyelid: Secondary | ICD-10-CM | POA: Diagnosis not present

## 2016-04-09 DIAGNOSIS — H02056 Trichiasis without entropian left eye, unspecified eyelid: Secondary | ICD-10-CM | POA: Diagnosis not present

## 2016-04-09 DIAGNOSIS — H01021 Squamous blepharitis right upper eyelid: Secondary | ICD-10-CM | POA: Diagnosis not present

## 2016-04-12 ENCOUNTER — Encounter: Payer: Self-pay | Admitting: Family Medicine

## 2016-04-12 ENCOUNTER — Ambulatory Visit (INDEPENDENT_AMBULATORY_CARE_PROVIDER_SITE_OTHER): Payer: Medicare Other | Admitting: Family Medicine

## 2016-04-12 VITALS — BP 130/70 | HR 67 | Temp 97.4°F | Resp 12 | Wt 104.3 lb

## 2016-04-12 DIAGNOSIS — E7849 Other hyperlipidemia: Secondary | ICD-10-CM

## 2016-04-12 DIAGNOSIS — N183 Chronic kidney disease, stage 3 unspecified: Secondary | ICD-10-CM

## 2016-04-12 DIAGNOSIS — F418 Other specified anxiety disorders: Secondary | ICD-10-CM

## 2016-04-12 DIAGNOSIS — I10 Essential (primary) hypertension: Secondary | ICD-10-CM

## 2016-04-12 DIAGNOSIS — I739 Peripheral vascular disease, unspecified: Secondary | ICD-10-CM | POA: Diagnosis not present

## 2016-04-12 DIAGNOSIS — Z0000 Encounter for general adult medical examination without abnormal findings: Secondary | ICD-10-CM | POA: Diagnosis not present

## 2016-04-12 DIAGNOSIS — E784 Other hyperlipidemia: Secondary | ICD-10-CM

## 2016-04-12 LAB — COMPREHENSIVE METABOLIC PANEL
ALT: 15 U/L (ref 0–35)
AST: 17 U/L (ref 0–37)
Albumin: 4 g/dL (ref 3.5–5.2)
Alkaline Phosphatase: 62 U/L (ref 39–117)
BUN: 21 mg/dL (ref 6–23)
CO2: 30 meq/L (ref 19–32)
Calcium: 9.4 mg/dL (ref 8.4–10.5)
Chloride: 105 mEq/L (ref 96–112)
Creatinine, Ser: 0.89 mg/dL (ref 0.40–1.20)
GFR: 65.74 mL/min (ref >60.00)
GLUCOSE: 82 mg/dL (ref 70–99)
POTASSIUM: 4.3 meq/L (ref 3.5–5.1)
SODIUM: 141 meq/L (ref 135–145)
Total Bilirubin: 0.7 mg/dL (ref 0.2–1.2)
Total Protein: 6.4 g/dL (ref 6.0–8.3)

## 2016-04-12 LAB — LIPID PANEL
Cholesterol: 133 mg/dL (ref 0–200)
HDL: 52.3 mg/dL (ref >39.00)
LDL Cholesterol: 53 mg/dL (ref 0–99)
NonHDL: 80.55
TRIGLYCERIDES: 140 mg/dL (ref 0.0–149.0)
Total CHOL/HDL Ratio: 3
VLDL: 28 mg/dL (ref 0.0–40.0)

## 2016-04-12 LAB — MICROALBUMIN / CREATININE URINE RATIO
Creatinine,U: 79.3 mg/dL
MICROALB/CREAT RATIO: 0.9 mg/g (ref 0.0–30.0)
Microalb, Ur: <0.7 mg/dL (ref 0.0–1.9)

## 2016-04-12 LAB — VITAMIN D 25 HYDROXY (VIT D DEFICIENCY, FRACTURES): VITD: 20.12 ng/mL — ABNORMAL LOW (ref 30.00–100.00)

## 2016-04-12 NOTE — Progress Notes (Signed)
Pre visit review using our clinic review tool, if applicable. No additional management support is needed unless otherwise documented below in the visit note. 

## 2016-04-12 NOTE — Progress Notes (Signed)
[name] is a 76 y.o.female, who is here today to follow on some of ehr chronic medical problems. She is also having her routine Medicare preventive visit today.   Hypertension:   Currently on Amlodipine 2.5 mg daily and Diovan 320 mg daily.  She taking medications as instructed, no side effects reported.  She has not noted unusual headache, visual changes, exertional chest pain, dyspnea,  focal weakness, or edema.  + CKD III, she has not noted gross hematuria or foam in urine.   Lab Results  Component Value Date   CREATININE 0.97 (H) 09/21/2015   BUN 19 09/21/2015   NA 139 09/21/2015   K 4.2 09/21/2015   CL 105 09/21/2015   CO2 26 09/21/2015    BP at home low 120/60's. She follows with ophthalmologists, Hx of blepharitis.   She also follows with gyn and currently she is on hormonal therapy, s/p hysterectomy.  She lives with son (Hx of Down synd) and her husbund.  Anxiety: She is currently on Lorazepam 0.5 mg bid as needed, she takes it daily at night to help her sleep. Anxiety is stable, she has intermittent exacerbations. In the past she has taken [facility] but she does not feel she needs to add medications at this time. She denies depressed mood or suicidal thoughts.  She still has Rx left from former PCP, 3 months supply.  Hyperlipidemia:  Currently on Crestor 5 mg 1/2 tab daily. Following a low fat diet: Yes.  She has not noted side effects with medication when she takes a low dose but has achyng with higher doses. .  Lab Results  Component Value Date   CHOL 137 09/21/2015   HDL 61 09/21/2015   LDLCALC 35 09/21/2015   TRIG 203 (H) 09/21/2015   CHOLHDL 2.2 09/21/2015    + Hx of carotid artery stenosis, she followed with vascular in the past and according to pt, she was following with PCP for the past couple years. Having Doppler U/S q 2 years, last one 04/2015. Denies dizziness.  She takes daily Aspirin 81 mg.   Review of Systems    Constitutional: Negative for activity change, appetite change, fatigue, fever and unexpected weight change.  HENT: Negative for mouth sores, nosebleeds and trouble swallowing.   Eyes: Negative for pain, redness and visual disturbance.  Respiratory: Negative for cough, shortness of breath and wheezing.   Cardiovascular: Negative for chest pain, palpitations and leg swelling.  Gastrointestinal: Negative for abdominal pain, nausea and vomiting.       Negative for changes in bowel habits.  Genitourinary: Negative for decreased urine volume, difficulty urinating and hematuria.  Skin: Negative for rash.  Allergic/Immunologic: Positive for environmental allergies.  Neurological: Negative for syncope, weakness and headaches.  Psychiatric/Behavioral: Negative for confusion and suicidal ideas. The patient is nervous/anxious.        Current Outpatient Prescriptions on File Prior to Visit  Medication Sig Dispense Refill  . amLODipine (NORVASC) 2.5 MG tablet Take 1 tablet (2.5 mg total) by mouth daily. 90 tablet 3  . aspirin 81 MG tablet Take 81 mg by mouth daily.    . clobetasol (TEMOVATE) 0.05 % external solution Apply 10 drops to scalp every night at bedtime. 50 mL 11  . cycloSPORINE (RESTASIS) 0.05 % ophthalmic emulsion 1 drop 2 (two) times daily.    Marland Kitchen desloratadine (CLARINEX) 5 MG tablet TAKE 1 TABLET BY MOUTH DAILY AS DIRECTED 90 tablet 2  . fluticasone (FLONASE) 50 MCG/ACT nasal  spray USE 2 SPRAYS INTO EACH NOSTRIL DAILY 48 g 2  . LORazepam (ATIVAN) 0.5 MG tablet Take 1 tablet (0.5 mg total) by mouth 2 (two) times daily. 180 tablet 1  . METRONIDAZOLE, TOPICAL, 0.75 % LOTN APPLY TOPICALLY TO AFFECTED AREA. 59 mL 11  . OVER THE COUNTER MEDICATION OTC Imodium taking daily for diarrrhea    . rosuvastatin (CRESTOR) 5 MG tablet Take 0.5 tablets (2.5 mg total) by mouth daily. 45 tablet 3  . valsartan (DIOVAN) 320 MG tablet Take 1 tablet (320 mg total) by mouth daily. 90 tablet 3  . VIVELLE-DOT  0.0375 MG/24HR      No current facility-administered medications on file prior to visit.      Past Medical History:  Diagnosis Date  . Allergy   . Anxiety   . Cataract   . Depression   . Hyperlipidemia   . Hypertension     Allergies  Allergen Reactions  . Codeine   . Erythromycin     Sick on stomach  . Latex   . Lisinopril   . Sulfa Antibiotics     rash    Social History   Social History  . Marital status: Married    Spouse name: N/A  . Number of children: N/A  . Years of education: N/A   Occupational History  . retired/homemaker    Social History Main Topics  . Smoking status: Never Smoker  . Smokeless tobacco: Never Used  . Alcohol use Yes     Comment: 1-2 oz weekly  . Drug use: No  . Sexual activity: No   Other Topics Concern  . None   Social History Narrative  . None    Vitals:   04/12/16 0753  BP: 130/70  Pulse: 67  Resp: 12  Temp: 97.4 F (36.3 C)   Body mass index is 20.71 kg/m.   Physical Exam  Nursing note and vitals reviewed. Constitutional: She is oriented to person, place, and time. She appears well-developed and well-nourished. No distress.  HENT:  Head: Atraumatic.  Mouth/Throat: Uvula is midline, oropharynx is clear and moist and mucous membranes are normal.  Eyes: Conjunctivae and EOM are normal. Pupils are equal, round, and reactive to light.  Neck: Carotid bruit is present (R>L). No thyroid mass and no thyromegaly present.  Cardiovascular: Normal rate and regular rhythm.   No murmur heard. Pulses:      Dorsalis pedis pulses are 2+ on the right side, and 2+ on the left side.  Respiratory: Effort normal and breath sounds normal. No respiratory distress.  GI: Soft. She exhibits no mass. There is no hepatomegaly. There is no tenderness.  Musculoskeletal: She exhibits no edema or tenderness.  Lymphadenopathy:    She has no cervical adenopathy.  Neurological: She is alert and oriented to person, place, and time. She has  normal strength. No cranial nerve deficit. Coordination and gait normal.  Reflex Scores:      Bicep reflexes are 2+ on the right side and 2+ on the left side.      Patellar reflexes are 2+ on the right side and 2+ on the left side. Skin: Skin is warm. No rash noted. No erythema.  Psychiatric: Her speech is normal. Her mood appears anxious. Cognition and memory are normal.  Well groomed, good eye contact.         Stephanie Aguilar was seen today for annual exam and medicare wellness.  Diagnoses and all orders for this visit:    Lab Results  Component Value Date   CHOL 133 04/12/2016   HDL 52.30 04/12/2016   LDLCALC 53 04/12/2016   TRIG 140.0 04/12/2016   CHOLHDL 3 04/12/2016     Chemistry      Component Value Date/Time   NA 141 04/12/2016 0916   K 4.3 04/12/2016 0916   CL 105 04/12/2016 0916   CO2 30 04/12/2016 0916   BUN 21 04/12/2016 0916   CREATININE 0.89 04/12/2016 0916   CREATININE 0.97 (H) 09/21/2015 0906      Component Value Date/Time   CALCIUM 9.4 04/12/2016 0916   ALKPHOS 62 04/12/2016 0916   AST 17 04/12/2016 0916   ALT 15 04/12/2016 0916   BILITOT 0.7 04/12/2016 0916       CKD (chronic kidney disease), stage III  Adequate hydration and avoidance of NSAID's. Continue Diovan. Good BP controlled. F/U in 6 months.  -     Comprehensive metabolic panel -     VITAMIN D 25 Hydroxy (Vit-D Deficiency, Fractures) -     Microalbumin / creatinine urine ratio  Essential hypertension  Adequately controlled. No changes in current management. DASH-low salt diet recommended. Continue periodica eye examination. F/U in 6 months, before if needed.  -     Comprehensive metabolic panel  Other hyperlipidemia  Continue low fat diet. No changes in Crestor. Further recommendations will be given according to lab results. F/U in 6-12 months.  -     Lipid panel  PAD (peripheral artery disease) (HCC)  Asymptomatic. Continue Aspirin and Crestor. No smoker. F/U  annually.  -     Lipid panel  Other specified anxiety disorders  Stable, she is nit interested in adding a SSRI.  No changes in current management, some side effects of Lorazepam discussed. Explained I do not fill 3 month supply of controlled medications. F/U in 6 months.        Bladimir Auman G. Martinique, MD  [facility]. [place] office.

## 2016-04-12 NOTE — Patient Instructions (Addendum)
A few things to remember from today's visit:   Essential hypertension  Other hyperlipidemia  PAD (peripheral artery disease) ([place])  Other specified anxiety disorders   Please be sure medication list is accurate. If a new problem present, please set up appointment sooner than planned today.    Stephanie Aguilar , Thank you for taking time to come for your Medicare Wellness Visit. I appreciate your ongoing commitment to your health goals. Please review the following plan we discussed and let me know if I can assist you in the future.   To have pharmacy refill meds in June when they run out Can make 6 month apt with Dr. Martinique for changes or general fup   To check BP periodically and bring these records in  Minimal Blood Pressure Goal= AVERAGE < 140/90; Ideal is an AVERAGE < 135/85. This AVERAGE should be calculated from @ least 5-7 BP readings taken @ different times of day on different days of week. You should not respond to isolated BP readings , but rather the AVERAGE for that week .Please bring your blood pressure cuff to office visits to verify that it is reliable.It can also be checked against the blood pressure device at the pharmacy. Finger or wrist cuffs are not dependable; an arm cuff is.  Also, monitor sodium intake in label of any canned food, as well as sodium in common everyday condiments; ketchup; salad dressing, sauces and any fast food restaurant.     These are the goals we discussed: maintain health ;  Goals    None      This is a list of the screening recommended for you and due dates:  Health Maintenance  Topic Date Due  . Mammogram  07/07/2016  . Tetanus Vaccine  02/02/2017  . Colon Cancer Screening  12/30/2021  . Flu Shot  Completed  . DEXA scan (bone density measurement)  Completed  . Shingles Vaccine  Completed  . Pneumonia vaccines  Completed

## 2016-04-12 NOTE — Progress Notes (Addendum)
Subjective:   Stephanie Aguilar is a 76 y.o. female who presents for Medicare Annual (Subsequent) preventive examination.  The Patient was informed that the wellness visit is to identify future health risk and educate and initiate measures that can reduce risk for increased disease through the lifespan.    NO ROS; Medicare Wellness Visit  Married  Has 4 children;  Youngest has down's syndrome  Cultures of health and exercise in family The patient was a Research scientist (life sciences) health as good, fair or great? Good   The following written screening schedule of preventive measures were reviewed with assessment and plan made per below and patient instructions:  Smoking history reviewed   / never smoked  Second Hand Smoke status; No Smokers in the home Did grew up with tobacco smokers in the home  ETOH; rare once a week if out   RISK FACTORS Regular exercise- walk several times a week Over one mile; sometimes she doubles this Hikes as a family; 5 miles is normal  [place];  [place]  [place]; Environmental education officer;  Also has a Vegetable garden   Diet; incorporates fruits and vegetables;  Colorful fresh food and eat more plant based Does not buy or eat any prepared foods  mediterranean diet  More fish and chicken   Fall risk: no; good exercise program  presented mobile; get up and go WNL  Mobility of Functional changes this year? no Safety at home and  Community reviewed; wears sunscreen if in the sun; yes Keeps firearms in a safe place if they exist Safe driving recommendations for older adults Motor vehicle accidents assessed in the last year  no   Depression Screen PhQ 2: negative  Activities of Daily Living - See functional screen   Hearing Screening Comments: Hearing test' hears very well  Vision Screening Comments: Vision checks Had cataract surgery in may 2017 Recently has new rx Dr. Clent Jacks Since eye surgery she can't  see close up;  Restasis; refresh Blepharitis    Cognitive testing; Ad8 score; 0 or less than 2  MMSE deferred or completed if AD8 + 2 issues  Advanced Directives reviewed for completion; discussion with MD as well as supportive resources as needed  Patient Care Team: Betty G Martinique, MD as PCP - General (Family Medicine) Dr. Delcie Roch; GYN Dr. Howell Rucks; GI Quincy Sheehan screens reviewed Colonoscopy has 4 years ago; have aged out / bu Dr. Howell Rucks Mammogram ; 09/2015 Dexa 05/2012 -0.4 (followed by Brien Few; GYN  Pap - aged out     Immunization History  Administered Date(s) Administered  . Influenza Split 12/19/2011  . Influenza,inj,Quad PF,36+ Mos 12/22/2012, 12/21/2013, 12/20/2014, 01/01/2016  . Meningococcal Polysaccharide 02/14/2009  . Pneumococcal Conjugate-13 03/15/2014  . Pneumococcal Polysaccharide-23 02/14/2009  . Tdap 02/03/2007   Required Immunizations needed today  Screening test up to date or reviewed for plan of completion There are no preventive care reminders to display for this patient.    Cardiac Risk Factors include: advanced age (>62men, >1 women);family history of premature cardiovascular disease;hypertension     Objective:     Vitals: BP 130/70 (BP Location: Left Arm, Patient Position: Sitting, Cuff Size: Normal)   Pulse 67   Temp 97.4 F (36.3 C) (Oral)   Resp 12   Wt 104 lb 4.8 oz (47.3 kg)   SpO2 97%   BMI 20.71 kg/m   Body mass index is 20.71 kg/m.   Tobacco  History  Smoking Status  . Never Smoker  Smokeless Tobacco  . Never Used     Counseling given: Yes   Past Medical History:  Diagnosis Date  . Allergy   . Anxiety   . Cataract   . Depression   . Hyperlipidemia   . Hypertension    Past Surgical History:  Procedure Laterality Date  . ABDOMINAL HYSTERECTOMY    . APPENDECTOMY    . BREAST SURGERY    . TONSILLECTOMY AND ADENOIDECTOMY     Family History  Problem Relation Age of Onset    . Hypertension Mother   . Alcohol abuse Father   . COPD Brother   . Hypertension Brother   . Alcohol abuse Brother   . Heart disease Maternal Grandmother   . Cancer Maternal Grandfather   . Stroke Paternal Grandmother   . Gout Brother   . Alcohol abuse Brother    History  Sexual Activity  . Sexual activity: No    Outpatient Encounter Prescriptions as of 04/12/2016  Medication Sig  . ALREX 0.2 % SUSP   . amLODipine (NORVASC) 2.5 MG tablet Take 1 tablet (2.5 mg total) by mouth daily.  Marland Kitchen aspirin 81 MG tablet Take 81 mg by mouth daily.  . clobetasol (TEMOVATE) 0.05 % external solution Apply 10 drops to scalp every night at bedtime.  . cycloSPORINE (RESTASIS) 0.05 % ophthalmic emulsion 1 drop 2 (two) times daily.  Marland Kitchen desloratadine (CLARINEX) 5 MG tablet TAKE 1 TABLET BY MOUTH DAILY AS DIRECTED  . fluticasone (FLONASE) 50 MCG/ACT nasal spray USE 2 SPRAYS INTO EACH NOSTRIL DAILY  . LORazepam (ATIVAN) 0.5 MG tablet Take 1 tablet (0.5 mg total) by mouth 2 (two) times daily.  Marland Kitchen METRONIDAZOLE, TOPICAL, 0.75 % LOTN APPLY TOPICALLY TO AFFECTED AREA.  Marland Kitchen OVER THE COUNTER MEDICATION OTC Imodium taking daily for diarrrhea  . rosuvastatin (CRESTOR) 5 MG tablet Take 0.5 tablets (2.5 mg total) by mouth daily.  . valsartan (DIOVAN) 320 MG tablet Take 1 tablet (320 mg total) by mouth daily.  Marland Kitchen VIVELLE-DOT 0.0375 MG/24HR   . [DISCONTINUED] acebutolol (SECTRAL) 200 MG capsule TAKE 1 CAPSULE BY MOUTH DAILY.  . [DISCONTINUED] ofloxacin (OCUFLOX) 0.3 % ophthalmic solution    No facility-administered encounter medications on file as of 04/12/2016.     Activities of Daily Living In your present state of health, do you have any difficulty performing the following activities: 04/12/2016  Hearing? N  Vision? N  Difficulty concentrating or making decisions? N  Walking or climbing stairs? N  Dressing or bathing? N  Doing errands, shopping? N  Preparing Food and eating ? N  Using the Toilet? N  In the past  six months, have you accidently leaked urine? N  Do you have problems with loss of bowel control? (No Data)  Managing your Medications? N  Managing your Finances? N  Housekeeping or managing your Housekeeping? N  Some recent data might be hidden    Patient Care Team: Betty G Martinique, MD as PCP - General (Family Medicine)    Assessment:     Exercise Activities and Dietary recommendations Current Exercise Habits: Home exercise routine, Type of exercise: strength training/weights;walking (hikes, ), Time (Minutes): 45, Frequency (Times/Week): 5, Weekly Exercise (Minutes/Week): 225, Intensity: Moderate Goals is to maintain health  Goals    . patient          To maintain her health       Fall Risk Fall Risk  04/12/2016 04/12/2016 09/21/2015 03/21/2015 03/15/2014  Falls in the past year? No No Yes No No  Number falls in past yr: - - 2 or more - -  Injury with Fall? - - No - -   Depression Screen PHQ 2/9 Scores 04/12/2016 04/12/2016 09/21/2015 03/21/2015  PHQ - 2 Score 0 0 0 0     Cognitive Function MMSE - Mini Mental State Exam 04/12/2016  Not completed: (No Data)    no issues     Immunization History  Administered Date(s) Administered  . Influenza Split 12/19/2011  . Influenza,inj,Quad PF,36+ Mos 12/22/2012, 12/21/2013, 12/20/2014, 01/01/2016  . Meningococcal Polysaccharide 02/14/2009  . Pneumococcal Conjugate-13 03/15/2014  . Pneumococcal Polysaccharide-23 02/14/2009  . Tdap 02/03/2007   Screening Tests Health Maintenance  Topic Date Due  . MAMMOGRAM  07/07/2016  . TETANUS/TDAP  02/02/2017  . COLONOSCOPY  12/30/2021  . INFLUENZA VACCINE  Completed  . DEXA SCAN  Completed  . ZOSTAVAX  Completed  . PNA vac Low Risk Adult  Completed      Plan:  No preventive health due Dr. Ronita Hipps following mammogram; dexa and pelvic; waive pap  Labs today  During the course of the visit the patient was educated and counseled about the following appropriate screening and preventive  services:   Vaccines to include Pneumoccal, Influenza, Hepatitis B, Td, Zostavax, HCV  Electrocardiogram  Cardiovascular Disease  Colorectal cancer screening  Bone density screening good exercise plan; walks; hikes; gardens  Diabetes screening neg  Glaucoma screening neg  Mammography/PAP  Nutrition counseling   Patient Instructions (the written plan) was given to the patient.   [id number], RN  04/12/2016   I have reviewed documentation from this visit and I agree with recommendations given.  Betty G. Martinique, MD  [facility]. [place] office.

## 2016-04-13 DIAGNOSIS — N183 Chronic kidney disease, stage 3 unspecified: Secondary | ICD-10-CM | POA: Insufficient documentation

## 2016-06-03 DIAGNOSIS — D225 Melanocytic nevi of trunk: Secondary | ICD-10-CM | POA: Diagnosis not present

## 2016-06-03 DIAGNOSIS — L814 Other melanin hyperpigmentation: Secondary | ICD-10-CM | POA: Diagnosis not present

## 2016-06-03 DIAGNOSIS — Z411 Encounter for cosmetic surgery: Secondary | ICD-10-CM | POA: Diagnosis not present

## 2016-06-03 DIAGNOSIS — L719 Rosacea, unspecified: Secondary | ICD-10-CM | POA: Diagnosis not present

## 2016-06-03 DIAGNOSIS — L300 Nummular dermatitis: Secondary | ICD-10-CM | POA: Diagnosis not present

## 2016-06-03 DIAGNOSIS — L821 Other seborrheic keratosis: Secondary | ICD-10-CM | POA: Diagnosis not present

## 2016-06-04 ENCOUNTER — Other Ambulatory Visit: Payer: Self-pay | Admitting: Family Medicine

## 2016-06-04 DIAGNOSIS — J309 Allergic rhinitis, unspecified: Secondary | ICD-10-CM

## 2016-09-04 ENCOUNTER — Other Ambulatory Visit: Payer: Self-pay | Admitting: Family Medicine

## 2016-09-04 ENCOUNTER — Encounter: Payer: Self-pay | Admitting: Family Medicine

## 2016-09-04 ENCOUNTER — Other Ambulatory Visit: Payer: Self-pay | Admitting: Emergency Medicine

## 2016-09-04 ENCOUNTER — Ambulatory Visit (INDEPENDENT_AMBULATORY_CARE_PROVIDER_SITE_OTHER): Payer: Medicare Other | Admitting: Family Medicine

## 2016-09-04 VITALS — BP 158/86 | HR 78 | Temp 98.4°F | Resp 17 | Ht 59.0 in | Wt 103.0 lb

## 2016-09-04 DIAGNOSIS — R0789 Other chest pain: Secondary | ICD-10-CM

## 2016-09-04 DIAGNOSIS — Z5181 Encounter for therapeutic drug level monitoring: Secondary | ICD-10-CM | POA: Diagnosis not present

## 2016-09-04 DIAGNOSIS — F411 Generalized anxiety disorder: Secondary | ICD-10-CM | POA: Diagnosis not present

## 2016-09-04 DIAGNOSIS — I10 Essential (primary) hypertension: Secondary | ICD-10-CM

## 2016-09-04 DIAGNOSIS — J309 Allergic rhinitis, unspecified: Secondary | ICD-10-CM

## 2016-09-04 DIAGNOSIS — E785 Hyperlipidemia, unspecified: Secondary | ICD-10-CM | POA: Diagnosis not present

## 2016-09-04 DIAGNOSIS — E782 Mixed hyperlipidemia: Secondary | ICD-10-CM | POA: Diagnosis not present

## 2016-09-04 LAB — TROPONIN I: Troponin I: <0.01 ng/mL (ref 0.00–0.04)

## 2016-09-04 MED ORDER — LORAZEPAM 0.5 MG PO TABS: 0.5000 mg | ORAL_TABLET | Freq: Two times a day (BID) | ORAL | 1 refills | Status: DC

## 2016-09-04 MED ORDER — VALSARTAN 320 MG PO TABS: 320.0000 mg | ORAL_TABLET | Freq: Every day | ORAL | 3 refills | Status: DC

## 2016-09-04 MED ORDER — ROSUVASTATIN CALCIUM 5 MG PO TABS: 2.5000 mg | ORAL_TABLET | Freq: Every day | ORAL | 3 refills | Status: DC

## 2016-09-04 MED ORDER — AMLODIPINE BESYLATE 2.5 MG PO TABS: 2.5000 mg | ORAL_TABLET | Freq: Every day | ORAL | 3 refills | Status: DC

## 2016-09-04 NOTE — Patient Instructions (Addendum)
  Thank you for coming in today, it was so nice to see you! Today we talked about:    Blood pressure: Please check your blood pressure once daily. You will want to check it when you are relaxed and have not drank any caffeine. Your goal BP is for the top number to be under 150 and the bottom number to be under 90. If you are higher than that, please make an appt to be seen at the clinic because we may need to make some adjustments to your medications.   High cholesterol: Continue Crestor 5 mg daily  Anxiety: We will refill your Ativan today.   Chest pain: Please touch base with Dr. Einar Gip and make an appointment. It is less likely that something is going on with your heart but we want to make sure.   Please follow up in 3 months or sooner if needed. You can schedule this appointment at the front desk before you leave or call the clinic.  Bring in all your medications or supplements to each appointment for review.   Sincerely,  Smitty Cords, MD    IF you received an x-ray today, you will receive an invoice from [facility]. Please contact [facility] at [phone] with questions or concerns regarding your invoice.   IF you received labwork today, you will receive an invoice from [place]. Please contact [facility] at [phone] with questions or concerns regarding your invoice.   Our billing staff will not be able to assist you with questions regarding bills from these companies.  You will be contacted with the lab results as soon as they are available. The fastest way to get your results is to activate your My Chart account. Instructions are located on the last page of this paperwork. If you have not heard from [place] regarding the results in 2 weeks, please contact this office.

## 2016-09-04 NOTE — Progress Notes (Signed)
Stephanie Aguilar is a 76 y.o. female who presents to Primary Care at [facility] today for  Chief Complaint  Patient presents with  . Medication Refill    ativan,crestor,diovan,norvasc    1.  Hypertension Blood pressure at home: Checks a couple times a week. Usually BP 761-607 systolic and 37-10 diastolic Exercise: Is active, walks 1.5 miles several times a week and also gardens  Low salt diet: Sometimes adherent. Eats lots of fruits and vegetables  Medications: Compliant with Amlodipine 2.5 mg daily and Diovan 320 mg daily. Also taking Acebutolol 200 mg daily for the last 4-5 years.  Side effects: None She has previously followed with Dr. Einar Gip in the past for shoulder pain that they thought may be cardiac- stress test was negative Sept 2016.  ROS: Denies headache, dizziness, visual changes, nausea, vomiting, abdominal pain or shortness of breath. BP Readings from Last 3 Encounters:  09/04/16 (!) 158/86  04/12/16 130/70  11/07/15 122/80   2. Hyperlipidemia Symptoms Chest pain on exertion: Sometimes she has chest tightness sometimes with stress, lasts for a couple minutes at time. Can happen at rest but not with exertion. This has been going on for about a month Leg claudication: No  Medications (modifying factor): Crestor 2.5 mg daily. Was taking a higher dose at one point but did not tolerate.  Compliance- Good   Right upper quadrant pain- No   Muscle aches- None    Component Value Date/Time   CHOL 133 04/12/2016 0916   TRIG 140.0 04/12/2016 0916   HDL 52.30 04/12/2016 0916   VLDL 28.0 04/12/2016 0916   CHOLHDL 3 04/12/2016 0916   3. Anxiety: Stable. Takes Lorazepam before bed. Takes 1-2 pills a day. Has been taking for about 20 years. Denies any panic attacks. Using Lorazepam mostly to help her sleep. She has not tried any other medications for anxiety.   ROS as above.  Pertinently, palpitations, SOB, Fever, Chills, Abd pain, N/V/D.   PMH reviewed. Patient is a  nonsmoker.   Past Medical History:  Diagnosis Date  . Allergy   . Anxiety   . Cataract   . Depression   . Hyperlipidemia   . Hypertension    Past Surgical History:  Procedure Laterality Date  . ABDOMINAL HYSTERECTOMY    . APPENDECTOMY    . BREAST SURGERY    . TONSILLECTOMY AND ADENOIDECTOMY      Medications reviewed. Current Outpatient Prescriptions  Medication Sig Dispense Refill  . acebutolol (SECTRAL) 200 MG capsule Take 200 mg by mouth daily.    . ALREX 0.2 % SUSP     . amLODipine (NORVASC) 2.5 MG tablet Take 1 tablet (2.5 mg total) by mouth daily. 90 tablet 3  . aspirin 81 MG tablet Take 81 mg by mouth daily.    . clobetasol (TEMOVATE) 0.05 % external solution Apply 10 drops to scalp every night at bedtime. 50 mL 11  . cycloSPORINE (RESTASIS) 0.05 % ophthalmic emulsion 1 drop 2 (two) times daily.    Marland Kitchen desloratadine (CLARINEX) 5 MG tablet TAKE 1 TABLET DAILY AS DIRECTED. 90 tablet 1  . fluticasone (FLONASE) 50 MCG/ACT nasal spray USE 2 SPRAYS INTO EACH NOSTRIL DAILY 48 g 2  . LORazepam (ATIVAN) 0.5 MG tablet Take 1 tablet (0.5 mg total) by mouth 2 (two) times daily. 180 tablet 1  . METRONIDAZOLE, TOPICAL, 0.75 % LOTN APPLY TOPICALLY TO AFFECTED AREA. 59 mL 11  . OVER THE COUNTER MEDICATION OTC Imodium taking daily for diarrrhea    .  rosuvastatin (CRESTOR) 5 MG tablet Take 0.5 tablets (2.5 mg total) by mouth daily. 45 tablet 3  . valsartan (DIOVAN) 320 MG tablet Take 1 tablet (320 mg total) by mouth daily. 90 tablet 3  . VIVELLE-DOT 0.0375 MG/24HR      No current facility-administered medications for this visit.     Physical Exam:  BP (!) 158/86 (BP Location: Left Arm, Patient Position: Sitting, Cuff Size: Normal)   Pulse 78   Temp 98.4 F (36.9 C) (Oral)   Resp 17   Ht 4\' 11"  (1.499 m)   Wt 103 lb (46.7 kg)   SpO2 98%   BMI 20.80 kg/m  Gen:  Alert, cooperative patient who appears stated age in no acute distress.  Vital signs reviewed. HEENT: EOMI,  MMM Pulm:   Clear to auscultation bilaterally with good air movement.  No wheezes or rales noted.   Cardiac:  Regular rate and rhythm without murmur auscultated.  Good S1/S2. Abd:  Soft/nondistended/nontender.  Good bowel sounds throughout all four quadrants.  No masses noted.  Exts: Non edematous BL  LE, warm and well perfused.   Assessment and Plan:  1. Hypertension: Uncontrolled today in office. Initially 162/67 and was 158/86 when rechecked. Patient's BP has been normal at home. Suspect some aspect of white coat hypertension. Goal BP <150/90 per [facility] 8. Last BMP normal in Jan 2018.  - Continue Amlodipine 2.5 mg daily, Diovan 320 mg daily, and Acebutolol 200 mg daily - Continue checking blood pressure at least once weekly. She will return to the clinic if BP >150/90 at home.  - Discussed heart healthy diet - If BP uncontrolled at home, suggest increasing Amlodipine at follow up  2. Hyperlipidemia: Controlled. Normal Lipid Panel per Jan 2018.  - Continue 2.5 mg of Crestor daily - Check lipid panel in 6 months  3. Anxiety: Has been on Ativan for 20 + years. Usually takes just once daily at night - Continue 0.5 mg Ativan BID PRN, will provide refill today for 3 month supply - Return in 3 months   4. Atypical Chest pain: Pain likely from anxiety. Had a normal stress test in 2016 by Dr. Einar Gip per her report.  - Continue to monitor symptoms, discussed reasons to go to the ED - Patient will make a follow up appt with Dr. Ignatius Specking, [place], PGY-2

## 2016-09-04 NOTE — Progress Notes (Signed)
Subjective:    Patient ID: Stephanie Aguilar, female    DOB: [birth year], 76 y.o.   MRN: [id number] Chief Complaint  Patient presents with  . Medication Refill    ativan,crestor,diovan,norvasc    HPI  Vitamin D 2000u/d  Past Medical History:  Diagnosis Date  . Allergy   . Anxiety   . Cataract   . Depression   . Hyperlipidemia   . Hypertension    Past Surgical History:  Procedure Laterality Date  . ABDOMINAL HYSTERECTOMY    . APPENDECTOMY    . BREAST SURGERY    . TONSILLECTOMY AND ADENOIDECTOMY     Current Outpatient Prescriptions on File Prior to Visit  Medication Sig Dispense Refill  . ALREX 0.2 % SUSP     . aspirin 81 MG tablet Take 81 mg by mouth daily.    . clobetasol (TEMOVATE) 0.05 % external solution Apply 10 drops to scalp every night at bedtime. 50 mL 11  . cycloSPORINE (RESTASIS) 0.05 % ophthalmic emulsion 1 drop 2 (two) times daily.    . fluticasone (FLONASE) 50 MCG/ACT nasal spray USE 2 SPRAYS INTO EACH NOSTRIL DAILY 48 g 2  . METRONIDAZOLE, TOPICAL, 0.75 % LOTN APPLY TOPICALLY TO AFFECTED AREA. 59 mL 11  . OVER THE COUNTER MEDICATION OTC Imodium taking daily for diarrrhea    . VIVELLE-DOT 0.0375 MG/24HR      No current facility-administered medications on file prior to visit.    Allergies  Allergen Reactions  . Codeine   . Erythromycin     Sick on stomach  . Latex   . Lisinopril   . Sulfa Antibiotics     rash   Family History  Problem Relation Age of Onset  . Hypertension Mother   . Alcohol abuse Father   . COPD Brother   . Hypertension Brother   . Alcohol abuse Brother   . Heart disease Maternal Grandmother   . Cancer Maternal Grandfather   . Stroke Paternal Grandmother   . Gout Brother   . Alcohol abuse Brother    Social History   Social History  . Marital status: Married    Spouse name: N/A  . Number of children: N/A  . Years of education: N/A   Occupational History  . retired/homemaker    Social History Main Topics    . Smoking status: Never Smoker  . Smokeless tobacco: Never Used  . Alcohol use Yes     Comment: 1-2 oz weekly  . Drug use: No  . Sexual activity: No   Other Topics Concern  . None   Social History Narrative  . None   Depression screen [facility] 2/9 04/12/2016 04/12/2016 09/21/2015 03/21/2015 12/12/2014  Decreased Interest 0 0 0 0 0  Down, Depressed, Hopeless 0 0 0 0 0  PHQ - 2 Score 0 0 0 0 0     Review of Systems     Objective:   Physical Exam  Constitutional: She is oriented to person, place, and time. She appears well-developed and well-nourished. No distress.  HENT:  Head: Normocephalic and atraumatic.  Right Ear: External ear normal.  Left Ear: External ear normal.  Eyes: Conjunctivae are normal. No scleral icterus.  Neck: Normal range of motion. Neck supple. Carotid bruit is present (Rt worse than left). No thyromegaly present.  Cardiovascular: Normal rate, regular rhythm, normal heart sounds and intact distal pulses.   Pulmonary/Chest: Effort normal and breath sounds normal. No respiratory distress.  Musculoskeletal: She exhibits no edema.  Lymphadenopathy:    She has no cervical adenopathy.  Neurological: She is alert and oriented to person, place, and time.  Skin: Skin is warm and dry. She is not diaphoretic. No erythema.  Psychiatric: She has a normal mood and affect. Her behavior is normal.      BP (!) 158/86 (BP Location: Left Arm, Patient Position: Sitting, Cuff Size: Normal)   Pulse 78   Temp 98.4 F (36.9 C) (Oral)   Resp 17   Ht 4\' 11"  (1.499 m)   Wt 103 lb (46.7 kg)   SpO2 98%   BMI 20.80 kg/m   [facility] reading (PRIMARY) by  Dr. Brigitte Pulse. EKG: unchanged from prior.     Assessment & Plan:   1. Mixed hyperlipidemia   2. Generalized anxiety disorder   3. Essential hypertension   4. Atypical chest pain   5. Hyperlipidemia, unspecified hyperlipidemia type   6. Essential hypertension, benign   7. Medication monitoring encounter    Pt will f/u for AWV  in 6 mos for ativan refill (uses #180/6 mo - does NOT use a refill). Will be due for carotid dopplers at that time (done every 2 yrs to monitor).  Orders Placed This Encounter  Procedures  . Troponin I  . Comprehensive metabolic panel  . Ambulatory referral to Cardiology    Referral Priority:   Routine    Referral Type:   Consultation    Referral Reason:   Specialty Services Required    Requested Specialty:   Cardiology    Number of Visits Requested:   1  . EKG 12-Lead    Meds ordered this encounter  Medications  . DISCONTD: acebutolol (SECTRAL) 200 MG capsule    Sig: Take 200 mg by mouth daily.  Marland Kitchen LORazepam (ATIVAN) 0.5 MG tablet    Sig: Take 1 tablet (0.5 mg total) by mouth 2 (two) times daily.    Dispense:  180 tablet    Refill:  1  . valsartan (DIOVAN) 320 MG tablet    Sig: Take 1 tablet (320 mg total) by mouth daily.    Dispense:  90 tablet    Refill:  3  . rosuvastatin (CRESTOR) 5 MG tablet    Sig: Take 0.5 tablets (2.5 mg total) by mouth daily.    Dispense:  45 tablet    Refill:  3  . amLODipine (NORVASC) 2.5 MG tablet    Sig: Take 1 tablet (2.5 mg total) by mouth daily.    Dispense:  90 tablet    Refill:  3    Delman Cheadle, M.D.  Primary Care at [facility] [address] [place], [place] [address] [phone] phone [phone] fax  09/05/16 12:50 AM

## 2016-09-05 LAB — COMPREHENSIVE METABOLIC PANEL
ALBUMIN: 4.4 g/dL (ref 3.5–4.8)
ALT: 13 IU/L (ref 0–32)
AST: 17 IU/L (ref 0–40)
Albumin/Globulin Ratio: 2 (ref 1.2–2.2)
Alkaline Phosphatase: 73 IU/L (ref 39–117)
BUN / CREAT RATIO: 25 (ref 12–28)
BUN: 23 mg/dL (ref 8–27)
Bilirubin Total: 0.2 mg/dL (ref 0.0–1.2)
CALCIUM: 9.5 mg/dL (ref 8.7–10.3)
CO2: 23 mmol/L (ref 18–29)
Chloride: 105 mmol/L (ref 96–106)
Creatinine, Ser: 0.91 mg/dL (ref 0.57–1.00)
GFR calc Af Amer: 71 mL/min/{1.73_m2} (ref >59)
GFR, EST NON AFRICAN AMERICAN: 62 mL/min/{1.73_m2} (ref >59)
GLOBULIN, TOTAL: 2.2 g/dL (ref 1.5–4.5)
Glucose: 94 mg/dL (ref 65–99)
Potassium: 4.3 mmol/L (ref 3.5–5.2)
SODIUM: 143 mmol/L (ref 134–144)
Total Protein: 6.6 g/dL (ref 6.0–8.5)

## 2016-10-17 DIAGNOSIS — Z0189 Encounter for other specified special examinations: Secondary | ICD-10-CM | POA: Diagnosis not present

## 2016-10-17 DIAGNOSIS — I10 Essential (primary) hypertension: Secondary | ICD-10-CM | POA: Diagnosis not present

## 2016-10-17 DIAGNOSIS — R0989 Other specified symptoms and signs involving the circulatory and respiratory systems: Secondary | ICD-10-CM | POA: Diagnosis not present

## 2016-10-17 DIAGNOSIS — E783 Hyperchylomicronemia: Secondary | ICD-10-CM | POA: Diagnosis not present

## 2016-10-23 DIAGNOSIS — Z124 Encounter for screening for malignant neoplasm of cervix: Secondary | ICD-10-CM | POA: Diagnosis not present

## 2016-10-23 DIAGNOSIS — Z01419 Encounter for gynecological examination (general) (routine) without abnormal findings: Secondary | ICD-10-CM | POA: Diagnosis not present

## 2016-10-23 DIAGNOSIS — Z1231 Encounter for screening mammogram for malignant neoplasm of breast: Secondary | ICD-10-CM | POA: Diagnosis not present

## 2016-11-04 DIAGNOSIS — H01025 Squamous blepharitis left lower eyelid: Secondary | ICD-10-CM | POA: Diagnosis not present

## 2016-11-04 DIAGNOSIS — H04123 Dry eye syndrome of bilateral lacrimal glands: Secondary | ICD-10-CM | POA: Diagnosis not present

## 2016-11-04 DIAGNOSIS — H02056 Trichiasis without entropian left eye, unspecified eyelid: Secondary | ICD-10-CM | POA: Diagnosis not present

## 2016-11-04 DIAGNOSIS — H0011 Chalazion right upper eyelid: Secondary | ICD-10-CM | POA: Diagnosis not present

## 2016-11-04 DIAGNOSIS — H01022 Squamous blepharitis right lower eyelid: Secondary | ICD-10-CM | POA: Diagnosis not present

## 2016-11-04 DIAGNOSIS — H26493 Other secondary cataract, bilateral: Secondary | ICD-10-CM | POA: Diagnosis not present

## 2016-11-04 DIAGNOSIS — H0015 Chalazion left lower eyelid: Secondary | ICD-10-CM | POA: Diagnosis not present

## 2016-11-04 DIAGNOSIS — H01024 Squamous blepharitis left upper eyelid: Secondary | ICD-10-CM | POA: Diagnosis not present

## 2016-11-04 DIAGNOSIS — Z961 Presence of intraocular lens: Secondary | ICD-10-CM | POA: Diagnosis not present

## 2016-11-04 DIAGNOSIS — H01021 Squamous blepharitis right upper eyelid: Secondary | ICD-10-CM | POA: Diagnosis not present

## 2016-11-12 DIAGNOSIS — R0989 Other specified symptoms and signs involving the circulatory and respiratory systems: Secondary | ICD-10-CM | POA: Diagnosis not present

## 2016-11-26 ENCOUNTER — Telehealth: Payer: Self-pay

## 2016-11-26 DIAGNOSIS — D1801 Hemangioma of skin and subcutaneous tissue: Secondary | ICD-10-CM | POA: Diagnosis not present

## 2016-11-26 DIAGNOSIS — L814 Other melanin hyperpigmentation: Secondary | ICD-10-CM | POA: Diagnosis not present

## 2016-11-26 DIAGNOSIS — L821 Other seborrheic keratosis: Secondary | ICD-10-CM | POA: Diagnosis not present

## 2016-11-26 DIAGNOSIS — L304 Erythema intertrigo: Secondary | ICD-10-CM | POA: Diagnosis not present

## 2016-11-26 DIAGNOSIS — D225 Melanocytic nevi of trunk: Secondary | ICD-10-CM | POA: Diagnosis not present

## 2016-11-26 DIAGNOSIS — L2084 Intrinsic (allergic) eczema: Secondary | ICD-10-CM | POA: Diagnosis not present

## 2016-11-26 MED ORDER — IRBESARTAN 300 MG PO TABS: 300.0000 mg | ORAL_TABLET | Freq: Every day | ORAL | 1 refills | Status: DC

## 2016-11-26 NOTE — Telephone Encounter (Signed)
Received a fax from [facility] that patient's Valsartan was recalled. Do you want to change this ?

## 2016-11-26 NOTE — Telephone Encounter (Signed)
Sent in irbesartan to [facility] instead. Please let pt know to monitor her BP at home several times a week starting several weeks after the med change to ensure her BP is controlled on the new irbesartan. We will recheck at her annual visit this January

## 2016-11-28 ENCOUNTER — Telehealth: Payer: Self-pay

## 2016-11-28 NOTE — Telephone Encounter (Signed)
Called pt to schedule Medicare Annual Wellness Visit. Declined to schedule visit. -nr

## 2016-11-29 ENCOUNTER — Other Ambulatory Visit: Payer: Self-pay | Admitting: Family Medicine

## 2016-11-29 NOTE — Telephone Encounter (Signed)
Spoke with patient and advised of the recall notification on her medication. She stated that she went to her cardiologist when she heard and they gave her another medication, which I cannot recall the name of it. She said that they only gave her a month supply just enough to get her through until her appointment with you on the 6th. She said that she will explain this to you at her appointment on 12/05/16 when she comes in for her CPE. She also stated that she didn't want to try anything else because what she is on now seems to work.

## 2016-11-30 NOTE — Telephone Encounter (Signed)
Perfect. Thanks.

## 2016-12-01 ENCOUNTER — Other Ambulatory Visit: Payer: Self-pay | Admitting: Family Medicine

## 2016-12-05 ENCOUNTER — Ambulatory Visit (INDEPENDENT_AMBULATORY_CARE_PROVIDER_SITE_OTHER): Payer: Medicare Other | Admitting: Family Medicine

## 2016-12-05 ENCOUNTER — Encounter: Payer: Self-pay | Admitting: Family Medicine

## 2016-12-05 VITALS — BP 129/66 | HR 64 | Temp 98.0°F | Resp 16 | Ht 59.06 in | Wt 104.0 lb

## 2016-12-05 DIAGNOSIS — Z1389 Encounter for screening for other disorder: Secondary | ICD-10-CM

## 2016-12-05 DIAGNOSIS — Z136 Encounter for screening for cardiovascular disorders: Secondary | ICD-10-CM | POA: Diagnosis not present

## 2016-12-05 DIAGNOSIS — E785 Hyperlipidemia, unspecified: Secondary | ICD-10-CM

## 2016-12-05 DIAGNOSIS — Z1383 Encounter for screening for respiratory disorder NEC: Secondary | ICD-10-CM

## 2016-12-05 DIAGNOSIS — I10 Essential (primary) hypertension: Secondary | ICD-10-CM | POA: Diagnosis not present

## 2016-12-05 DIAGNOSIS — I739 Peripheral vascular disease, unspecified: Secondary | ICD-10-CM

## 2016-12-05 DIAGNOSIS — Z23 Encounter for immunization: Secondary | ICD-10-CM | POA: Diagnosis not present

## 2016-12-05 DIAGNOSIS — E559 Vitamin D deficiency, unspecified: Secondary | ICD-10-CM

## 2016-12-05 DIAGNOSIS — Z1329 Encounter for screening for other suspected endocrine disorder: Secondary | ICD-10-CM | POA: Diagnosis not present

## 2016-12-05 DIAGNOSIS — Z130 Encounter for screening for diseases of the blood and blood-forming organs and certain disorders involving the immune mechanism: Secondary | ICD-10-CM

## 2016-12-05 DIAGNOSIS — N183 Chronic kidney disease, stage 3 unspecified: Secondary | ICD-10-CM

## 2016-12-05 DIAGNOSIS — F418 Other specified anxiety disorders: Secondary | ICD-10-CM | POA: Diagnosis not present

## 2016-12-05 NOTE — Progress Notes (Signed)
Subjective:    Patient ID: Stephanie Aguilar, female    DOB: [birth year], 76 y.o.   MRN: [id number] Chief Complaint  Patient presents with  . Hyperlipidemia    3 month follow-up  . Hypertension  . Depression    with anxiety    HPI   Had infected dental implant removed which was infected so she was put on augmentin x 7d.  Has been eating yogurt daily - lite and lively as lactose-intolerant.  Bowels ok other than horrible night 1 wk after augmentin with severe diarrhea and abd cramping. Then she had bilateral styes - has had prior - and so was on doxycycline x 1 mo. Has f/u next wk. Has had epigastric pain - a dull ache in the center.  Takes imodium once a day after breakfast to control diarrhea.  Hypertension Blood pressure at home: Checks a couple times a week outside office. Usually BP 093-818 systolic and 29-93 diastolic Exercise: Is active, walks 1.5 miles several times a week and also gardens  Low salt diet: Sometimes adherent. Eats lots of fruits and vegetables  Medications: Compliant with Amlodipine 2.5 mg daily and Diovan 320 mg daily Which was changed by cardiology and she is here for a recheck of that today. Also taking Acebutolol 200 mg daily for the last 4-5 years.  Side effects: None She has previously followed with Dr. Einar Gip in the past for shoulder pain that they thought may be cardiac- stress test was negative Sept 2016.   Hyperlipidemia Lab Results  Component Value Date   LDLCALC 53 04/12/2016   Symptoms Chest pain on exertion: Sometimes she has chest tightness sometimes with stress, lasts for a couple minutes at time. Can happen at rest but not with exertion. This has been going on for about a month Leg claudication: No  Medications (modifying factor): Crestor 2.5 mg daily. Was taking a higher dose at one point but did not tolerate.  Compliance- Good        Anxiety: Stable. Takes Lorazepam before bed. Takes 1-2 pills a day. Has been taking for about 20 years.  Denies any panic attacks. Using Lorazepam mostly to help her sleep. She has not tried any other medications for anxiety.   Past Medical History:  Diagnosis Date  . Allergy   . Anxiety   . Cataract   . Depression   . Hyperlipidemia   . Hypertension    Past Surgical History:  Procedure Laterality Date  . ABDOMINAL HYSTERECTOMY    . APPENDECTOMY    . BREAST SURGERY    . DENTAL SURGERY    . TONSILLECTOMY AND ADENOIDECTOMY     Current Outpatient Prescriptions on File Prior to Visit  Medication Sig Dispense Refill  . acebutolol (SECTRAL) 200 MG capsule TAKE (1) CAPSULE DAILY. 90 capsule 1  . ALREX 0.2 % SUSP     . amLODipine (NORVASC) 2.5 MG tablet Take 1 tablet (2.5 mg total) by mouth daily. 90 tablet 3  . aspirin 81 MG tablet Take 81 mg by mouth daily.    . clobetasol (TEMOVATE) 0.05 % external solution Apply 10 drops to scalp every night at bedtime. 50 mL 11  . cycloSPORINE (RESTASIS) 0.05 % ophthalmic emulsion 1 drop 2 (two) times daily.    Marland Kitchen desloratadine (CLARINEX) 5 MG tablet TAKE 1 TABLET DAILY AS DIRECTED. 90 tablet 1  . fluticasone (FLONASE) 50 MCG/ACT nasal spray USE 2 SPRAYS INTO EACH NOSTRIL DAILY 48 g 2  . irbesartan (AVAPRO) 300 MG  tablet Take 1 tablet (300 mg total) by mouth daily. 90 tablet 1  . LORazepam (ATIVAN) 0.5 MG tablet Take 1 tablet (0.5 mg total) by mouth 2 (two) times daily. 180 tablet 1  . METRONIDAZOLE, TOPICAL, 0.75 % LOTN APPLY TOPICALLY TO AFFECTED AREA. 59 mL 11  . OVER THE COUNTER MEDICATION OTC Imodium taking daily for diarrrhea    . rosuvastatin (CRESTOR) 5 MG tablet Take 0.5 tablets (2.5 mg total) by mouth daily. 45 tablet 3  . VIVELLE-DOT 0.0375 MG/24HR      No current facility-administered medications on file prior to visit.    Allergies  Allergen Reactions  . Codeine   . Erythromycin     Sick on stomach  . Latex   . Lisinopril   . Sulfa Antibiotics     rash   Family History  Problem Relation Age of Onset  . Hypertension Mother   .  Alcohol abuse Father   . COPD Brother   . Hypertension Brother   . Alcohol abuse Brother   . Heart disease Maternal Grandmother   . Cancer Maternal Grandfather   . Stroke Paternal Grandmother   . Gout Brother   . Alcohol abuse Brother    Social History   Social History  . Marital status: Married    Spouse name: N/A  . Number of children: N/A  . Years of education: N/A   Occupational History  . retired/homemaker    Social History Main Topics  . Smoking status: Never Smoker  . Smokeless tobacco: Never Used  . Alcohol use Yes     Comment: 1-2 oz weekly  . Drug use: No  . Sexual activity: No   Other Topics Concern  . None   Social History Narrative  . None   Depression screen [facility] 2/9 12/05/2016 04/12/2016 04/12/2016 09/21/2015 03/21/2015  Decreased Interest 0 0 0 0 0  Down, Depressed, Hopeless 0 0 0 0 0  PHQ - 2 Score 0 0 0 0 0    Review of Systems See hpi    Objective:   Physical Exam  Constitutional: She is oriented to person, place, and time. She appears well-developed and well-nourished. No distress.  HENT:  Head: Normocephalic and atraumatic.  Right Ear: External ear normal.  Left Ear: External ear normal.  Eyes: Conjunctivae are normal. No scleral icterus.  Neck: Normal range of motion. Neck supple. No thyromegaly present.  Cardiovascular: Normal rate, regular rhythm, normal heart sounds and intact distal pulses.   Pulmonary/Chest: Effort normal and breath sounds normal. No respiratory distress.  Musculoskeletal: She exhibits no edema.  Lymphadenopathy:    She has no cervical adenopathy.  Neurological: She is alert and oriented to person, place, and time.  Skin: Skin is warm and dry. She is not diaphoretic. No erythema.  Psychiatric: She has a normal mood and affect. Her behavior is normal.      BP 129/66   Pulse 64   Temp 98 F (36.7 C) (Oral)   Resp 16   Ht 4' 11.06" (1.5 m)   Wt 104 lb (47.2 kg)   SpO2 98%   BMI 20.97 kg/m      Assessment &  Plan:  bnp since acei change  1. Essential hypertension   2. Need for prophylactic vaccination and inoculation against influenza   3. PAD (peripheral artery disease) ([place])   4. CKD (chronic kidney disease), stage III   5. Hyperlipidemia, unspecified hyperlipidemia type   6. Other specified anxiety disorders  7. Screening for cardiovascular, respiratory, and genitourinary diseases   8. Screening for deficiency anemia   9. Screening for thyroid disorder   10. Vitamin D deficiency     Orders Placed This Encounter  Procedures  . Flu Vaccine QUAD 36+ mos IM  . CBC with Differential/Platelet    Standing Status:   Future    Standing Expiration Date:   12/09/2017  . Comprehensive metabolic panel    Standing Status:   Future    Standing Expiration Date:   12/09/2017    Order Specific Question:   Has the patient fasted?    Answer:   Yes  . Lipid panel    Standing Status:   Future    Standing Expiration Date:   12/09/2017    Order Specific Question:   Has the patient fasted?    Answer:   Yes  . TSH    Standing Status:   Future    Standing Expiration Date:   12/09/2017  . VITAMIN D 25 Hydroxy (Vit-D Deficiency, Fractures)    Meds ordered this encounter  Medications  . Zoster Vac Recomb Adjuvanted [facility]) injection    Sig: Inject 0.5 mLs into the muscle once.    Dispense:  0.5 mL    Refill:  0    Delman Cheadle, M.D.  Primary Care at [facility] [address] [place], [place] [address] [phone] phone [phone] fax  12/09/16 8:39 AM

## 2016-12-05 NOTE — Patient Instructions (Signed)
Advanced Digestion or Digestion Advantage - coupon. Ultra Flora IB is really excellent but $$$$ - at [facility]

## 2016-12-09 ENCOUNTER — Ambulatory Visit: Payer: Medicare Other | Admitting: Physician Assistant

## 2016-12-09 DIAGNOSIS — I10 Essential (primary) hypertension: Secondary | ICD-10-CM

## 2016-12-09 MED ORDER — ZOSTER VAC RECOMB ADJUVANTED 50 MCG/0.5ML IM SUSR: 0.5000 mL | Freq: Once | INTRAMUSCULAR | 0 refills | Status: AC

## 2016-12-09 NOTE — Addendum Note (Signed)
Addended by: Shawnee Knapp on: 12/09/2016 08:42 AM   Modules accepted: Orders

## 2016-12-10 DIAGNOSIS — H43392 Other vitreous opacities, left eye: Secondary | ICD-10-CM | POA: Diagnosis not present

## 2016-12-10 DIAGNOSIS — H16223 Keratoconjunctivitis sicca, not specified as Sjogren's, bilateral: Secondary | ICD-10-CM | POA: Diagnosis not present

## 2016-12-10 DIAGNOSIS — Z961 Presence of intraocular lens: Secondary | ICD-10-CM | POA: Diagnosis not present

## 2016-12-11 ENCOUNTER — Encounter: Payer: Self-pay | Admitting: Family Medicine

## 2016-12-11 ENCOUNTER — Ambulatory Visit (INDEPENDENT_AMBULATORY_CARE_PROVIDER_SITE_OTHER): Payer: Medicare Other | Admitting: Family Medicine

## 2016-12-11 VITALS — BP 121/66 | HR 62 | Temp 98.0°F | Resp 18 | Ht 59.0 in | Wt 104.8 lb

## 2016-12-11 DIAGNOSIS — N183 Chronic kidney disease, stage 3 unspecified: Secondary | ICD-10-CM

## 2016-12-11 DIAGNOSIS — I10 Essential (primary) hypertension: Secondary | ICD-10-CM | POA: Diagnosis not present

## 2016-12-11 DIAGNOSIS — Z0000 Encounter for general adult medical examination without abnormal findings: Secondary | ICD-10-CM | POA: Diagnosis not present

## 2016-12-11 DIAGNOSIS — F418 Other specified anxiety disorders: Secondary | ICD-10-CM

## 2016-12-11 DIAGNOSIS — Z1329 Encounter for screening for other suspected endocrine disorder: Secondary | ICD-10-CM

## 2016-12-11 DIAGNOSIS — Z136 Encounter for screening for cardiovascular disorders: Secondary | ICD-10-CM

## 2016-12-11 DIAGNOSIS — Z1383 Encounter for screening for respiratory disorder NEC: Secondary | ICD-10-CM | POA: Diagnosis not present

## 2016-12-11 DIAGNOSIS — Z1389 Encounter for screening for other disorder: Secondary | ICD-10-CM | POA: Diagnosis not present

## 2016-12-11 DIAGNOSIS — E559 Vitamin D deficiency, unspecified: Secondary | ICD-10-CM

## 2016-12-11 DIAGNOSIS — Z130 Encounter for screening for diseases of the blood and blood-forming organs and certain disorders involving the immune mechanism: Secondary | ICD-10-CM

## 2016-12-11 DIAGNOSIS — E785 Hyperlipidemia, unspecified: Secondary | ICD-10-CM | POA: Diagnosis not present

## 2016-12-11 LAB — POCT URINALYSIS DIP (MANUAL ENTRY)
BILIRUBIN UA: NEGATIVE mg/dL
Bilirubin, UA: NEGATIVE
Blood, UA: NEGATIVE
Glucose, UA: NEGATIVE mg/dL
LEUKOCYTES UA: NEGATIVE
Nitrite, UA: NEGATIVE
PROTEIN UA: NEGATIVE mg/dL
SPEC GRAV UA: 1.015 (ref 1.010–1.025)
Urobilinogen, UA: 0.2 E.U./dL
pH, UA: 6 (ref 5.0–8.0)

## 2016-12-11 MED ORDER — ZOSTER VAC RECOMB ADJUVANTED 50 MCG/0.5ML IM SUSR: 0.5000 mL | Freq: Once | INTRAMUSCULAR | 1 refills | Status: AC

## 2016-12-11 NOTE — Progress Notes (Deleted)
Primary Preventative Screenings: Cervical Cancer:  Family Planning: STI screening: Breast Cancer: Colorectal Cancer:  Tobacco use/EtOH/substances: Bone Density: Cardiac: Weight/Blood sugar/Diet/Exercise: OTC/Vit/Supp/Herbal: Dentist/Optho: Immunizations:   Chronic Medical Conditions:

## 2016-12-11 NOTE — Patient Instructions (Addendum)
IF you received an x-ray today, you will receive an invoice from [facility]. Please contact [facility] at [phone] with questions or concerns regarding your invoice.   IF you received labwork today, you will receive an invoice from [facility]. Please contact [facility] at [phone] with questions or concerns regarding your invoice.   Our billing staff will not be able to assist you with questions regarding bills from these companies.  You will be contacted with the lab results as soon as they are available. The fastest way to get your results is to activate your My Chart account. Instructions are located on the last page of this paperwork. If you have not heard from [place] regarding the results in 2 weeks, please contact this office.      Calcium Intake Recommendations Calcium is a mineral that affects many functions in the body, including:  Blood clotting.  Blood vessel function.  Nerve impulse conduction.  Hormone secretion.  Muscle contraction.  Bone and teeth functions.  Most of your body's calcium supply is stored in your bones and teeth. When your calcium stores are low, you may be at risk for low bone mass, bone loss, and bone fractures. Consuming enough calcium helps to grow healthy bones and teeth and to prevent breakdown over time. It is very important that you get enough calcium if you are:  A child undergoing rapid growth.  An adolescent girl.  A pre- or post-menopausal woman.  A woman whose menstrual cycle has stopped due to anorexia nervosa or regular intense exercise.  An individual with lactose intolerance or a milk allergy.  A vegetarian.  What is my plan? Try to consume the recommended amount of calcium daily based on your age. Depending on your overall health, your health care provider may recommend increased calcium intake.General daily calcium intake recommendations by age are:  Birth to 6 months: 200 mg.  Infants 7 to 12  months: 260 mg.  Children 1 to 3 years: 700 mg.  Children 4 to 8 years: 1,000 mg.  Children 9 to 13 years: 1,300 mg.  Teens 14 to 18 years: 1,300 mg.  Adults 19 to 50 years: 1,000 mg.  Adult women 51 to 70 years: 1,200 mg.  Adult men 51 to 70 years: 1,000 mg.  Adults 71 years and older: 1,200 mg.  Pregnant and breastfeeding teens: 1,300 mg.  Pregnant and breastfeeding adults: 1,000 mg.  What do I need to know about calcium intake?  In order for the body to absorb calcium, it needs vitamin D. You can get vitamin D through: ? Direct exposure of the skin to sunlight. ? Foods, such as egg yolks, liver, saltwater fish, and fortified milk. ? Supplements.  Consuming too much calcium may cause: ? Constipation. ? Decreased absorption of iron and zinc. ? Kidney stones.  Calcium supplements may interact with certain medicines. Check with your health care provider before starting any calcium supplements.  Try to get most of your calcium from food. What foods can I eat? Grains  Fortified oatmeal. Fortified ready-to-eat cereals. Fortified frozen waffles. Vegetables Turnip greens. Broccoli. Fruits Fortified orange juice. Meats and Other Protein Sources Canned sardines with bones. Canned salmon with bones. Soy beans. Tofu. Baked beans. Almonds. [place] nuts. Sunflower seeds. Dairy Milk. Yogurt. Cheese. Cottage cheese. Beverages Fortified soy milk. Fortified rice milk. Sweets/Desserts Pudding. Ice Cream. Milkshakes. Blackstrap molasses. The items listed above may not be a complete list of recommended foods or beverages. Contact your dietitian for more  options. What foods can affect my calcium intake? It may be more difficult for your body to use calcium or calcium may leave your body more quickly if you consume large amounts of:  Sodium.  Protein.  Caffeine.  Alcohol.  This information is not intended to replace advice given to you by your health care provider. Make  sure you discuss any questions you have with your health care provider. Document Released: 10/31/2003 Document Revised: 10/06/2015 Document Reviewed: 08/24/2013 Elsevier Interactive Patient Education  2018 [place] protect organs, store calcium, and anchor muscles. Good health habits, such as eating nutritious foods and exercising regularly, are important for maintaining healthy bones. They can also help to prevent a condition that causes bones to lose density and become weak and brittle (osteoporosis). Why is bone mass important? Bone mass refers to the amount of bone tissue that you have. The higher your bone mass, the stronger your bones. An important step toward having healthy bones throughout life is to have strong and dense bones during childhood. A young adult who has a high bone mass is more likely to have a high bone mass later in life. Bone mass at its greatest it is called peak bone mass. A large decline in bone mass occurs in older adults. In women, it occurs about the time of menopause. During this time, it is important to practice good health habits, because if more bone is lost than what is replaced, the bones will become less healthy and more likely to break (fracture). If you find that you have a low bone mass, you may be able to prevent osteoporosis or further bone loss by changing your diet and lifestyle. How can I find out if my bone mass is low? Bone mass can be measured with an X-ray test that is called a bone mineral density (BMD) test. This test is recommended for all women who are age 84 or older. It may also be recommended for men who are age 33 or older, or for people who are more likely to develop osteoporosis due to:  Having bones that break easily.  Having a long-term disease that weakens bones, such as kidney disease or rheumatoid arthritis.  Having menopause earlier than normal.  Taking medicine that weakens bones, such as steroids, thyroid  hormones, or hormone treatment for breast cancer or prostate cancer.  Smoking.  Drinking three or more alcoholic drinks each day.  What are the nutritional recommendations for healthy bones? To have healthy bones, you need to get enough of the right minerals and vitamins. Most nutrition experts recommend getting these nutrients from the foods that you eat. Nutritional recommendations vary from person to person. Ask your health care provider what is healthy for you. Here are some general guidelines. Calcium Recommendations Calcium is the most important (essential) mineral for bone health. Most people can get enough calcium from their diet, but supplements may be recommended for people who are at risk for osteoporosis. Good sources of calcium include:  Dairy products, such as low-fat or nonfat milk, cheese, and yogurt.  Dark green leafy vegetables, such as bok choy and broccoli.  Calcium-fortified foods, such as orange juice, cereal, bread, soy beverages, and tofu products.  Nuts, such as almonds.  Follow these recommended amounts for daily calcium intake:  Children, age 66?3: 700 mg.  Children, age 52?8: 1,000 mg.  Children, age 57?13: 1,300 mg.  Teens, age 69?18: 1,300 mg.  Adults, age 31?50: 1,000 mg.  Adults,  age 44?70: ? Men: 1,000 mg. ? Women: 1,200 mg.  Adults, age 23 or older: 1,200 mg.  Pregnant and breastfeeding females: ? Teens: 1,300 mg. ? Adults: 1,000 mg.  Vitamin D Recommendations Vitamin D is the most essential vitamin for bone health. It helps the body to absorb calcium. Sunlight stimulates the skin to make vitamin D, so be sure to get enough sunlight. If you live in a cold climate or you do not get outside often, your health care provider may recommend that you take vitamin D supplements. Good sources of vitamin D in your diet include:  Egg yolks.  Saltwater fish.  Milk and cereal fortified with vitamin D.  Follow these recommended amounts for daily  vitamin D intake:  Children and teens, age 78?18: 12 international units.  Adults, age 107 or younger: 400-800 international units.  Adults, age 68 or older: 800-1,000 international units.  Other Nutrients Other nutrients for bone health include:  Phosphorus. This mineral is found in meat, poultry, dairy foods, nuts, and legumes. The recommended daily intake for adult men and adult women is 700 mg.  Magnesium. This mineral is found in seeds, nuts, dark green vegetables, and legumes. The recommended daily intake for adult men is 400?420 mg. For adult women, it is 310?320 mg.  Vitamin K. This vitamin is found in green leafy vegetables. The recommended daily intake is 120 mg for adult men and 90 mg for adult women.  What type of physical activity is best for building and maintaining healthy bones? Weight-bearing and strength-building activities are important for building and maintaining peak bone mass. Weight-bearing activities cause muscles and bones to work against gravity. Strength-building activities increases muscle strength that supports bones. Weight-bearing and muscle-building activities include:  Walking and hiking.  Jogging and running.  Dancing.  Gym exercises.  Lifting weights.  Tennis and racquetball.  Climbing stairs.  Aerobics.  Adults should get at least 30 minutes of moderate physical activity on most days. Children should get at least 60 minutes of moderate physical activity on most days. Ask your health care provide what type of exercise is best for you. Where can I find more information? For more information, check out the following websites:  [place]: YardHomes.se  [employer] of Health: http://www.niams.AnonymousEar.fr.asp  This information is not intended to replace advice given to you by your health care provider. Make sure you discuss any questions you have with  your health care provider. Document Released: 06/08/2003 Document Revised: 10/06/2015 Document Reviewed: 03/23/2014 Elsevier Interactive Patient Education  [employer].

## 2016-12-11 NOTE — Progress Notes (Signed)
Subjective:  By signing my name below, I, Stephanie Aguilar, attest that this documentation has been prepared under the direction and in the presence of Stephanie Cheadle, MD Electronically Signed: Ladene Artist, ED Scribe 12/11/2016 at 8:39 AM.   Patient ID: Stephanie Aguilar, female    DOB: 1940/04/25, 76 y.o.   MRN: [id number]  Chief Complaint  Patient presents with  . Annual Exam    CPE   HPI Stephanie Aguilar is a 76 y.o. female who presents to Primary Care at [facility] for an annual exam.   Cervical CA screening: n/a s/p hysterectomy Breast CA screening: Sees GYN Dr. Ronita Aguilar. Last mammogram in July by Dr. Ronita Aguilar; normal. No family h/o breast CA. Last Dexa 07/08/14; normal. Lowest T score -1.4 in L femur. Does takes HRT. Supplements: Takes 2000 mg Vit D daily. Lactose intolerant. Pt eats yogurt, 1-2 cups of lactate milk and lactate ice cream daily.  Cardiac: EKG 09/04/16. Carotid US: less than 50% bilateral 04/21/15. Seen several months prior by Dr. Einar Gip. States she had her carotid arteries rechecked which looked good.  Walks 1.5 miles for exercise several times/week and gardening. Diet excellent with low salt and lots of fresh foods. Dentist: seen every 6 months; last visit was last week. Dental implant ~4 years ago that became infected and was removed. Plans to have a dental bridge placed. Optho: seen regularly by Dr. Katy Aguilar.  Chronic Medical Conditions Vit D Deficiency and Vit D 20 8 months prior. HTN: monitors BP outside office. 110-120/65-80. Diovan changed to olmesartan with excellent result.  Recent diarrhea and abdominal cramping with epigastric pain. Taking imodium daily after breakfast. Attributed to several recent courses of antibiotics. Pt states that she is still having some abdominal distention but recently started a probiotic 2 days ago. Does report an episode of extreme diarrhea and emesis that lasted an entire night. Denies odd foods or sick contacts. States this was just  prior to a stye and prescription of Augmentin which caused GI upset. States she had never taken Augmentin prior but states it smelled like sour milk.  Immunization History  Administered Date(s) Administered  . Influenza Split 12/19/2011  . Influenza,inj,Quad PF,6+ Mos 12/22/2012, 12/21/2013, 12/20/2014, 01/01/2016, 12/05/2016  . Meningococcal Polysaccharide 02/14/2009  . Pneumococcal Conjugate-13 03/15/2014  . Pneumococcal Polysaccharide-23 02/14/2009  . Tdap 02/03/2007  Sent Rx for Shingrix to her pharmacy.   Past Medical History:  Diagnosis Date  . Allergy   . Anxiety   . Cataract   . Depression   . Hyperlipidemia   . Hypertension    Current Outpatient Prescriptions on File Prior to Visit  Medication Sig Dispense Refill  . acebutolol (SECTRAL) 200 MG capsule TAKE (1) CAPSULE DAILY. 90 capsule 1  . ALREX 0.2 % SUSP     . amLODipine (NORVASC) 2.5 MG tablet Take 1 tablet (2.5 mg total) by mouth daily. 90 tablet 3  . aspirin 81 MG tablet Take 81 mg by mouth daily.    . clobetasol (TEMOVATE) 0.05 % external solution Apply 10 drops to scalp every night at bedtime. 50 mL 11  . cycloSPORINE (RESTASIS) 0.05 % ophthalmic emulsion 1 drop 2 (two) times daily.    Marland Kitchen desloratadine (CLARINEX) 5 MG tablet TAKE 1 TABLET DAILY AS DIRECTED. 90 tablet 1  . fluticasone (FLONASE) 50 MCG/ACT nasal spray USE 2 SPRAYS INTO EACH NOSTRIL DAILY 48 g 2  . LORazepam (ATIVAN) 0.5 MG tablet Take 1 tablet (0.5 mg total) by mouth 2 (two) times daily. [place]  tablet 1  . METRONIDAZOLE, TOPICAL, 0.75 % LOTN APPLY TOPICALLY TO AFFECTED AREA. 59 mL 11  . OVER THE COUNTER MEDICATION OTC Imodium taking daily for diarrrhea    . rosuvastatin (CRESTOR) 5 MG tablet Take 0.5 tablets (2.5 mg total) by mouth daily. 45 tablet 3  . VIVELLE-DOT 0.0375 MG/24HR      No current facility-administered medications on file prior to visit.    Allergies  Allergen Reactions  . Codeine   . Erythromycin     Sick on stomach  . Latex   .  Lisinopril   . Sulfa Antibiotics     rash   Past Surgical History:  Procedure Laterality Date  . ABDOMINAL HYSTERECTOMY    . APPENDECTOMY    . BREAST SURGERY    . DENTAL SURGERY    . TONSILLECTOMY AND ADENOIDECTOMY     Family History  Problem Relation Age of Onset  . Hypertension Mother   . Alcohol abuse Father   . COPD Brother   . Hypertension Brother   . Alcohol abuse Brother   . Heart disease Maternal Grandmother   . Cancer Maternal Grandfather   . Stroke Paternal Grandmother   . Gout Brother   . Alcohol abuse Brother    Social History   Social History  . Marital status: Married    Spouse name: N/A  . Number of children: N/A  . Years of education: N/A   Occupational History  . retired/homemaker    Social History Main Topics  . Smoking status: Never Smoker  . Smokeless tobacco: Never Used  . Alcohol use Yes     Comment: 1-2 oz weekly  . Drug use: No  . Sexual activity: No   Other Topics Concern  . None   Social History Narrative  . None   Depression screen [facility] 2/9 12/11/2016 12/05/2016 04/12/2016 04/12/2016 09/21/2015  Decreased Interest 0 0 0 0 0  Down, Depressed, Hopeless 0 0 0 0 0  PHQ - 2 Score 0 0 0 0 0    Review of Systems  Gastrointestinal: Positive for abdominal distention.  All other systems reviewed and are negative.     Objective:   Physical Exam  Constitutional: She is oriented to person, place, and time. She appears well-developed and well-nourished. No distress.  HENT:  Head: Normocephalic and atraumatic.  Right Ear: Tympanic membrane normal.  Left Ear: Tympanic membrane normal.  Nose: Nose normal.  Mouth/Throat: Oropharynx is clear and moist and mucous membranes are normal.  Eyes: Conjunctivae and EOM are normal.  Neck: Neck supple. No tracheal deviation present. No thyroid mass present.  Cardiovascular: Normal rate, regular rhythm, S1 normal, S2 normal and normal heart sounds.   Pulses:      Dorsalis pedis pulses are 2+ on the  right side, and 2+ on the left side.  Pulmonary/Chest: Effort normal and breath sounds normal. No respiratory distress.  Abdominal: Bowel sounds are increased. There is no CVA tenderness.  Slightly tympanitic bowel sounds  Musculoskeletal: Normal range of motion.  Lymphadenopathy:    She has no cervical adenopathy.  Neurological: She is alert and oriented to person, place, and time.  Reflex Scores:      Patellar reflexes are 2+ on the right side and 2+ on the left side. Skin: Skin is warm and dry.  Psychiatric: She has a normal mood and affect. Her behavior is normal.  Nursing note and vitals reviewed.  BP 121/66   Pulse 62   Temp 98 F (  36.7 C) (Oral)   Resp 18   Ht 4\' 11"  (1.499 m)   Wt 104 lb 12.8 oz (47.5 kg)   SpO2 100%   BMI 21.17 kg/m     Results for orders placed or performed in visit on 12/11/16  CBC with Differential/Platelet  Result Value Ref Range   WBC 5.7 3.4 - 10.8 x10E3/uL   RBC 4.61 3.77 - 5.28 x10E6/uL   Hemoglobin 13.8 11.1 - 15.9 g/dL   Hematocrit 43.3 34.0 - 46.6 %   MCV 94 79 - 97 fL   MCH 29.9 26.6 - 33.0 pg   MCHC 31.9 31.5 - 35.7 g/dL   RDW 14.4 12.3 - 15.4 %   Platelets 197 150 - 379 x10E3/uL   Neutrophils 63 Not Estab. %   Lymphs 22 Not Estab. %   Monocytes 9 Not Estab. %   Eos 5 Not Estab. %   Basos 1 Not Estab. %   Neutrophils Absolute 3.5 1.4 - 7.0 x10E3/uL   Lymphocytes Absolute 1.3 0.7 - 3.1 x10E3/uL   Monocytes Absolute 0.5 0.1 - 0.9 x10E3/uL   EOS (ABSOLUTE) 0.3 0.0 - 0.4 x10E3/uL   Basophils Absolute 0.0 0.0 - 0.2 x10E3/uL   Immature Granulocytes 0 Not Estab. %   Immature Grans (Abs) 0.0 0.0 - 0.1 x10E3/uL  Comprehensive metabolic panel  Result Value Ref Range   Glucose 84 65 - 99 mg/dL   BUN 17 8 - 27 mg/dL   Creatinine, Ser 1.02 (H) 0.57 - 1.00 mg/dL   GFR calc non Af Amer 54 (L) >59 mL/min/1.73   GFR calc Af Amer 62 >59 mL/min/1.73   BUN/Creatinine Ratio 17 12 - 28   Sodium 142 134 - 144 mmol/L   Potassium 4.3 3.5 - 5.2  mmol/L   Chloride 103 96 - 106 mmol/L   CO2 25 20 - 29 mmol/L   Calcium 9.6 8.7 - 10.3 mg/dL   Total Protein 6.4 6.0 - 8.5 g/dL   Albumin 4.0 3.5 - 4.8 g/dL   Globulin, Total 2.4 1.5 - 4.5 g/dL   Albumin/Globulin Ratio 1.7 1.2 - 2.2   Bilirubin Total 0.3 0.0 - 1.2 mg/dL   Alkaline Phosphatase 65 39 - 117 IU/L   AST 21 0 - 40 IU/L   ALT 13 0 - 32 IU/L  Lipid panel  Result Value Ref Range   Cholesterol, Total 131 100 - 199 mg/dL   Triglycerides 194 (H) 0 - 149 mg/dL   HDL 57 >39 mg/dL   VLDL Cholesterol Cal 39 5 - 40 mg/dL   LDL Calculated 35 0 - 99 mg/dL   Chol/HDL Ratio 2.3 0.0 - 4.4 ratio  TSH  Result Value Ref Range   TSH 3.790 0.450 - 4.500 uIU/mL  VITAMIN D 25 Hydroxy (Vit-D Deficiency, Fractures)  Result Value Ref Range   Vit D, 25-Hydroxy 34.7 30.0 - 100.0 ng/mL  POCT urinalysis dipstick  Result Value Ref Range   Color, UA yellow yellow   Clarity, UA clear clear   Glucose, UA negative negative mg/dL   Bilirubin, UA negative negative   Ketones, POC UA negative negative mg/dL   Spec Grav, UA 1.015 1.010 - 1.025   Blood, UA negative negative   pH, UA 6.0 5.0 - 8.0   Protein Ur, POC negative negative mg/dL   Urobilinogen, UA 0.2 0.2 or 1.0 E.U./dL   Nitrite, UA Negative Negative   Leukocytes, UA Negative Negative    Assessment & Plan:   1. Annual physical  exam   2. Essential hypertension - refill olmesartan x 1 yr whenever requested (prev was rx'ed by cardiology but advised pt that I will start rxing as she otherwise does not need to cont to see cards)  3. CKD (chronic kidney disease), stage III   4. Other specified anxiety disorders   5. Screening for deficiency anemia   6. Hyperlipidemia, unspecified hyperlipidemia type   7. Screening for cardiovascular, respiratory, and genitourinary diseases   8. Screening for thyroid disorder   9. Vitamin D deficiency     Orders Placed This Encounter  Procedures  . POCT urinalysis dipstick    Meds ordered this  encounter  Medications  . olmesartan (BENICAR) 20 MG tablet    Sig: Take 20 mg by mouth daily.  . Cholecalciferol (VITAMIN D) 2000 units CAPS    Sig: Take 1 capsule by mouth daily.  Marland Kitchen Zoster Vac Recomb Adjuvanted [facility]) injection    Sig: Inject 0.5 mLs into the muscle once. Repeat once in 2-6 months    Dispense:  0.5 mL    Refill:  1    I personally performed the services described in this documentation, which was scribed in my presence. The recorded information has been reviewed and considered, and addended by me as needed.   Stephanie Aguilar, M.D.  Primary Care at [facility] [address] [place], [place] [address] [phone] phone [phone] fax  12/13/16 1:18 PM

## 2016-12-12 LAB — CBC WITH DIFFERENTIAL/PLATELET
Basophils Absolute: 0 10*3/uL (ref 0.0–0.2)
Basos: 1 %
EOS (ABSOLUTE): 0.3 10*3/uL (ref 0.0–0.4)
EOS: 5 %
HEMATOCRIT: 43.3 % (ref 34.0–46.6)
HEMOGLOBIN: 13.8 g/dL (ref 11.1–15.9)
IMMATURE GRANS (ABS): 0 10*3/uL (ref 0.0–0.1)
IMMATURE GRANULOCYTES: 0 %
LYMPHS ABS: 1.3 10*3/uL (ref 0.7–3.1)
Lymphs: 22 %
MCH: 29.9 pg (ref 26.6–33.0)
MCHC: 31.9 g/dL (ref 31.5–35.7)
MCV: 94 fL (ref 79–97)
MONOCYTES: 9 %
Monocytes Absolute: 0.5 10*3/uL (ref 0.1–0.9)
NEUTROS PCT: 63 %
Neutrophils Absolute: 3.5 10*3/uL (ref 1.4–7.0)
Platelets: 197 10*3/uL (ref 150–379)
RBC: 4.61 x10E6/uL (ref 3.77–5.28)
RDW: 14.4 % (ref 12.3–15.4)
WBC: 5.7 10*3/uL (ref 3.4–10.8)

## 2016-12-12 LAB — COMPREHENSIVE METABOLIC PANEL
ALBUMIN: 4 g/dL (ref 3.5–4.8)
ALT: 13 IU/L (ref 0–32)
AST: 21 IU/L (ref 0–40)
Albumin/Globulin Ratio: 1.7 (ref 1.2–2.2)
Alkaline Phosphatase: 65 IU/L (ref 39–117)
BUN / CREAT RATIO: 17 (ref 12–28)
BUN: 17 mg/dL (ref 8–27)
Bilirubin Total: 0.3 mg/dL (ref 0.0–1.2)
CALCIUM: 9.6 mg/dL (ref 8.7–10.3)
CO2: 25 mmol/L (ref 20–29)
CREATININE: 1.02 mg/dL — AB (ref 0.57–1.00)
Chloride: 103 mmol/L (ref 96–106)
GFR calc Af Amer: 62 mL/min/{1.73_m2} (ref >59)
GFR, EST NON AFRICAN AMERICAN: 54 mL/min/{1.73_m2} — AB (ref >59)
GLOBULIN, TOTAL: 2.4 g/dL (ref 1.5–4.5)
GLUCOSE: 84 mg/dL (ref 65–99)
Potassium: 4.3 mmol/L (ref 3.5–5.2)
SODIUM: 142 mmol/L (ref 134–144)
TOTAL PROTEIN: 6.4 g/dL (ref 6.0–8.5)

## 2016-12-12 LAB — LIPID PANEL
CHOL/HDL RATIO: 2.3 ratio (ref 0.0–4.4)
Cholesterol, Total: 131 mg/dL (ref 100–199)
HDL: 57 mg/dL (ref >39)
LDL CALC: 35 mg/dL (ref 0–99)
TRIGLYCERIDES: 194 mg/dL — AB (ref 0–149)
VLDL CHOLESTEROL CAL: 39 mg/dL (ref 5–40)

## 2016-12-12 LAB — TSH: TSH: 3.79 u[IU]/mL (ref 0.450–4.500)

## 2016-12-12 LAB — VITAMIN D 25 HYDROXY (VIT D DEFICIENCY, FRACTURES): VIT D 25 HYDROXY: 34.7 ng/mL (ref 30.0–100.0)

## 2017-03-03 ENCOUNTER — Other Ambulatory Visit: Payer: Self-pay | Admitting: Family Medicine

## 2017-03-03 DIAGNOSIS — J309 Allergic rhinitis, unspecified: Secondary | ICD-10-CM

## 2017-03-23 IMAGING — US US CAROTID DUPLEX BILAT
1 series · 13 of 24 positions shown · non-contrast
Comparison: None.

CLINICAL DATA: Asymptomatic carotid bruit, hypertension,
hyperlipidemia

EXAM:
BILATERAL CAROTID DUPLEX ULTRASOUND
TECHNIQUE: Gray scale imaging, color Doppler and duplex ultrasound were
performed of bilateral carotid and vertebral arteries in the neck.

[Series 1: us carotid duplex bilat · 0.07mm/px · 13 of 72 slices shown]
[im 1/72]
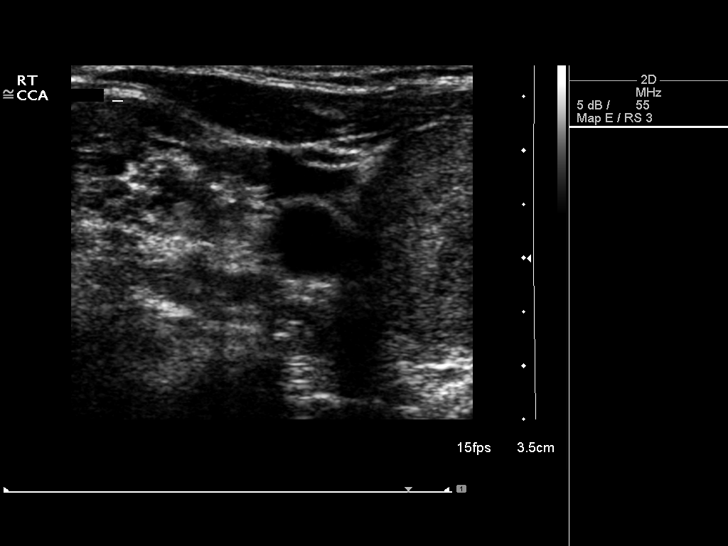
[im 7/72]
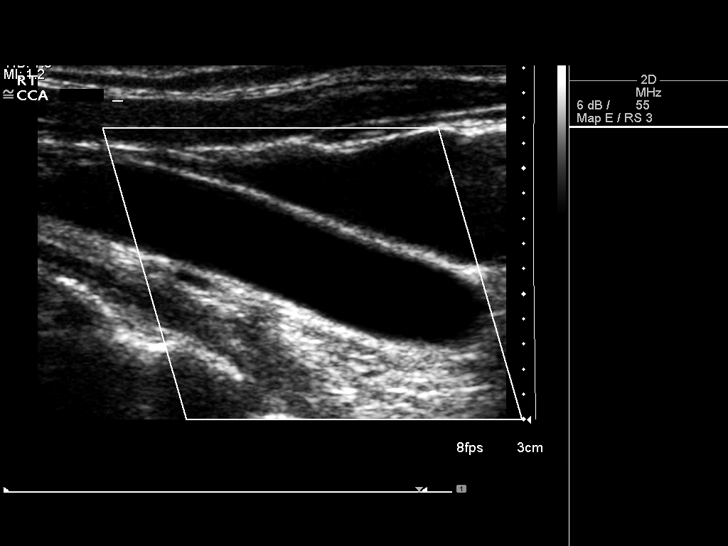
[im 13/72]
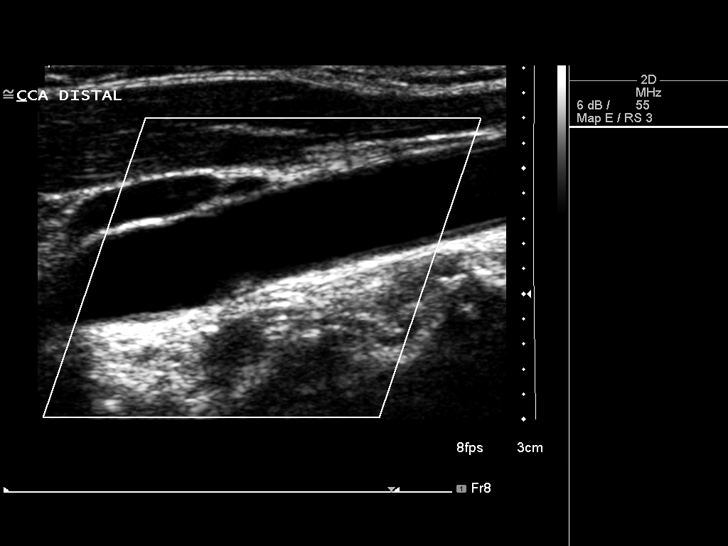
[im 19/72]
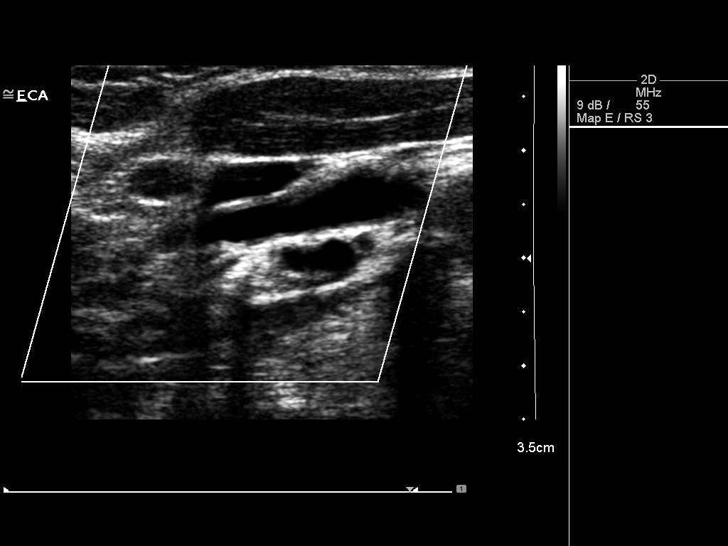
[im 25/72]
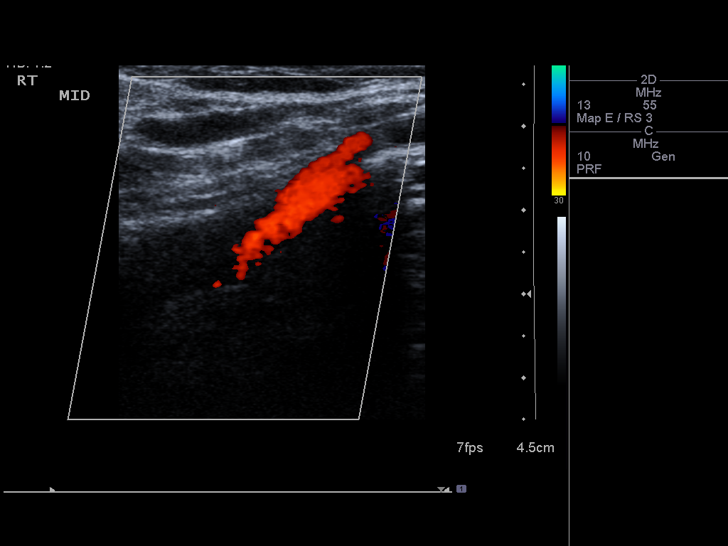
[im 31/72]
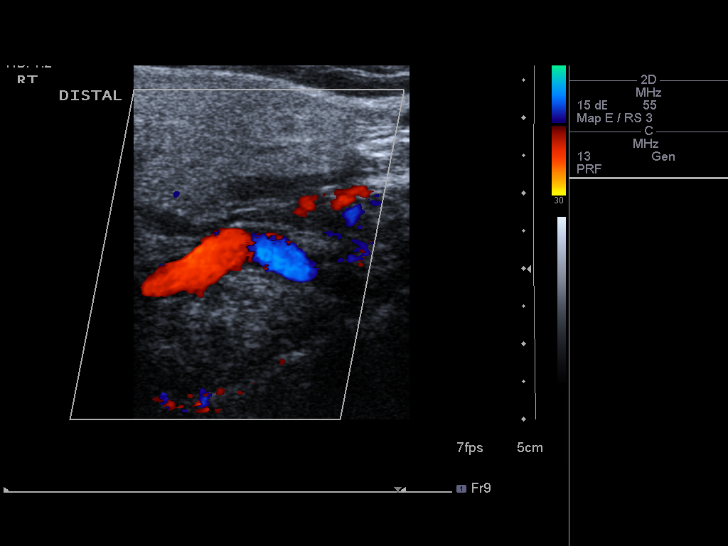
[im 38/72]
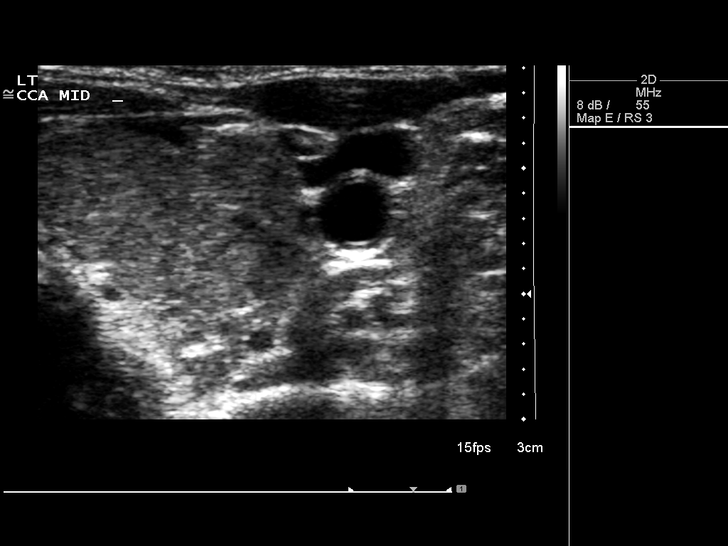
[im 41/72]
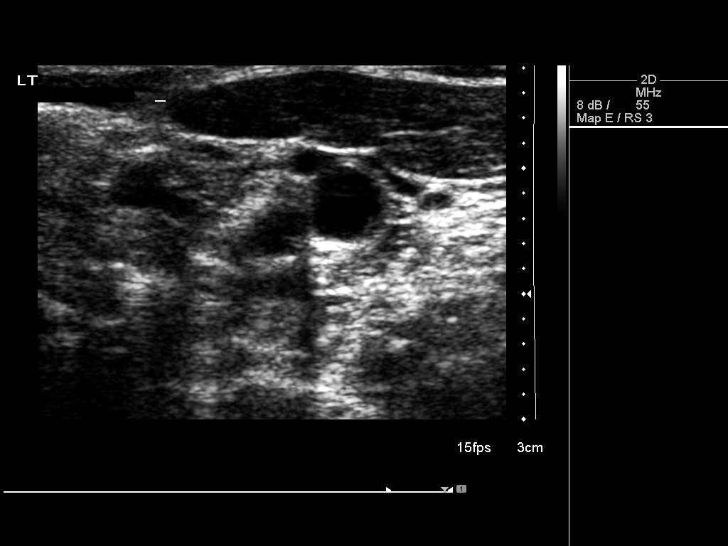
[im 47/72]
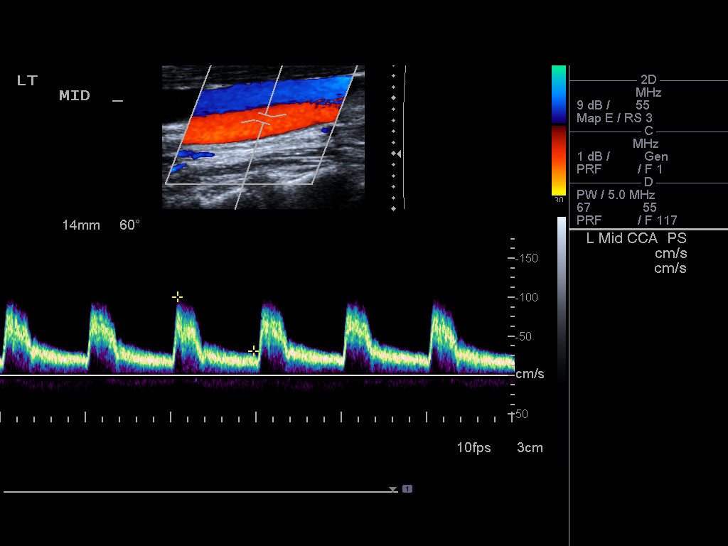
[im 53/72]
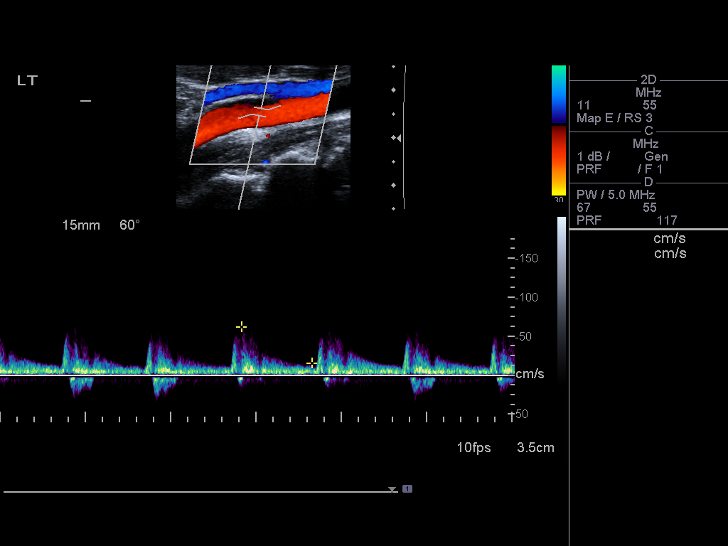
[im 59/72]
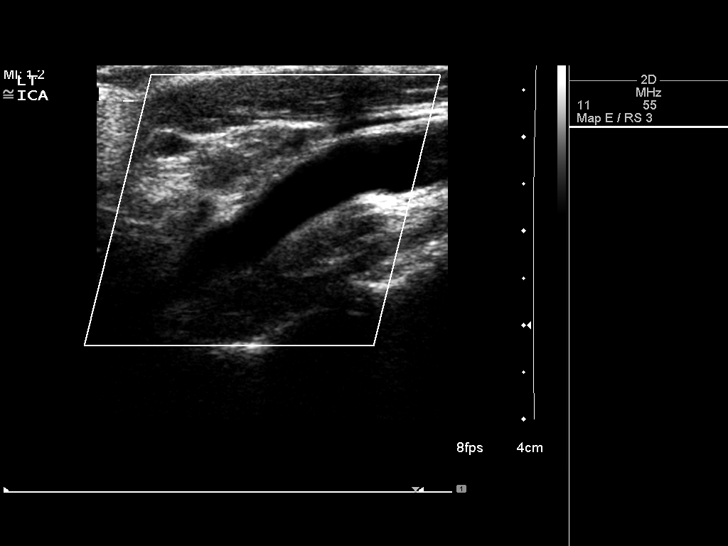
[im 65/72]
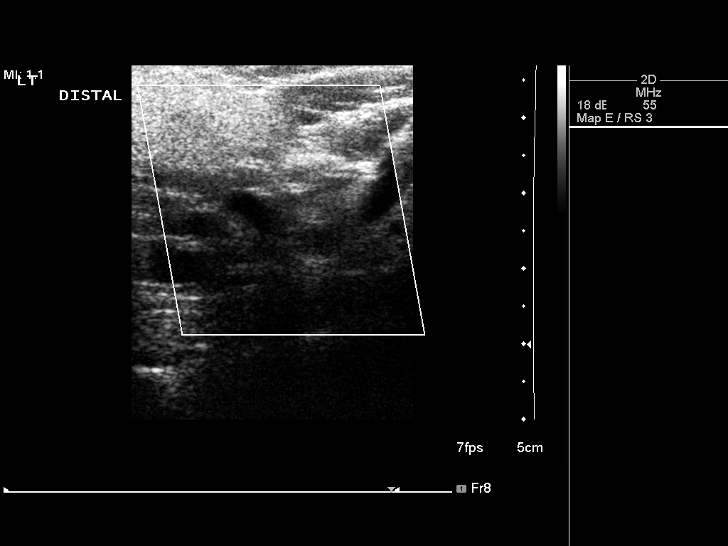
[im 72/72]
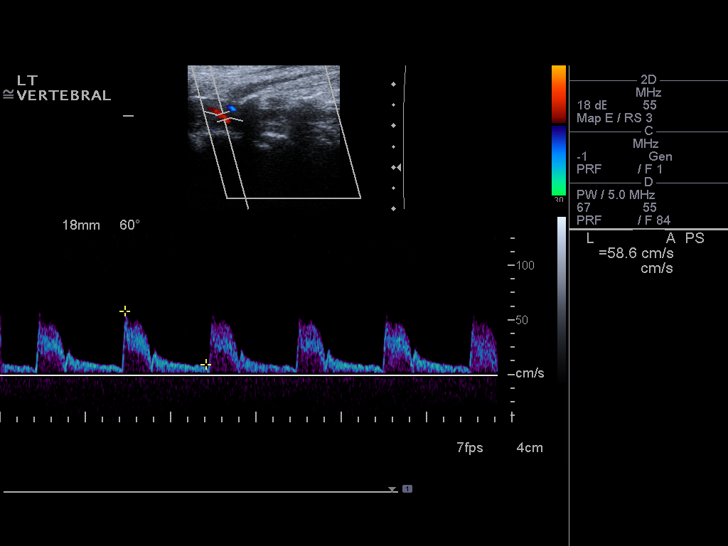

[13 of 24 positions shown; findings below may reference images not displayed]

FINDINGS: Criteria: Quantification of carotid stenosis is based on velocity
parameters that correlate the residual internal carotid diameter
with NASCET-based stenosis levels, using the diameter of the distal
internal carotid lumen as the denominator for stenosis measurement.

The following velocity measurements were obtained:

RIGHT

ICA:  136/34 cm/sec

CCA:  136/24 cm/sec

SYSTOLIC ICA/CCA RATIO:

DIASTOLIC ICA/CCA RATIO:

ECA:  89 cm/sec

LEFT

ICA:  137/38 cm/sec

CCA:  132/36 cm/sec

SYSTOLIC ICA/CCA RATIO:

DIASTOLIC ICA/CCA RATIO:

ECA:  75 cm/sec

RIGHT CAROTID ARTERY: Minor atherosclerotic change and tortuosity.
No hemodynamically significant right ICA stenosis, velocity
elevation, or turbulent flow. Degree of narrowing less than 50%.

RIGHT VERTEBRAL ARTERY:  Antegrade

LEFT CAROTID ARTERY: Similar minor atherosclerosis and tortuosity.
No hemodynamically significant left ICA stenosis, velocity
elevation, or turbulent flow.

LEFT VERTEBRAL ARTERY:  Antegrade
IMPRESSION: Minor carotid atherosclerosis and slight tortuosity. No
hemodynamically significant ICA stenosis. Degree of narrowing less
than 50% bilaterally.

Patent antegrade vertebral flow bilaterally.

## 2017-05-30 ENCOUNTER — Other Ambulatory Visit: Payer: Self-pay | Admitting: Family Medicine

## 2017-05-30 ENCOUNTER — Other Ambulatory Visit: Payer: Self-pay

## 2017-05-30 DIAGNOSIS — J309 Allergic rhinitis, unspecified: Secondary | ICD-10-CM

## 2017-06-04 ENCOUNTER — Other Ambulatory Visit: Payer: Self-pay | Admitting: Family Medicine

## 2017-06-04 DIAGNOSIS — J309 Allergic rhinitis, unspecified: Secondary | ICD-10-CM

## 2017-06-04 NOTE — Telephone Encounter (Signed)
LOV 12-11-16 with Dr. Brigitte Pulse / Refill request for Clarinex / Refilled per protocol

## 2017-06-09 ENCOUNTER — Encounter: Payer: Self-pay | Admitting: Family Medicine

## 2017-06-09 ENCOUNTER — Ambulatory Visit (INDEPENDENT_AMBULATORY_CARE_PROVIDER_SITE_OTHER): Payer: Medicare Other | Admitting: Family Medicine

## 2017-06-09 VITALS — BP 132/84 | HR 56 | Temp 97.5°F | Resp 18 | Ht 59.0 in | Wt 104.6 lb

## 2017-06-09 DIAGNOSIS — J309 Allergic rhinitis, unspecified: Secondary | ICD-10-CM

## 2017-06-09 DIAGNOSIS — I10 Essential (primary) hypertension: Secondary | ICD-10-CM

## 2017-06-09 DIAGNOSIS — D229 Melanocytic nevi, unspecified: Secondary | ICD-10-CM

## 2017-06-09 DIAGNOSIS — F411 Generalized anxiety disorder: Secondary | ICD-10-CM

## 2017-06-09 DIAGNOSIS — E782 Mixed hyperlipidemia: Secondary | ICD-10-CM | POA: Diagnosis not present

## 2017-06-09 MED ORDER — ACEBUTOLOL HCL 200 MG PO CAPS: ORAL_CAPSULE | ORAL | 3 refills | Status: DC

## 2017-06-09 MED ORDER — AMLODIPINE BESYLATE 2.5 MG PO TABS: 2.5000 mg | ORAL_TABLET | Freq: Every day | ORAL | 3 refills | Status: DC

## 2017-06-09 MED ORDER — DESLORATADINE 5 MG PO TABS: 5.0000 mg | ORAL_TABLET | Freq: Every day | ORAL | 1 refills | Status: DC

## 2017-06-09 MED ORDER — ROSUVASTATIN CALCIUM 5 MG PO TABS: 2.5000 mg | ORAL_TABLET | Freq: Every day | ORAL | 3 refills | Status: DC

## 2017-06-09 MED ORDER — OLMESARTAN MEDOXOMIL 20 MG PO TABS: 40.0000 mg | ORAL_TABLET | Freq: Every day | ORAL | 0 refills | Status: DC

## 2017-06-09 MED ORDER — LORAZEPAM 0.5 MG PO TABS: 0.5000 mg | ORAL_TABLET | Freq: Two times a day (BID) | ORAL | 1 refills | Status: DC

## 2017-06-09 NOTE — Patient Instructions (Signed)
Skin Biopsy  A skin biopsy is a procedure to remove a sample of your skin (lesion) so it can be checked under a microscope. You may need a skin biopsy if you have a skin disease or abnormal changes in your skin.  Tell a health care provider about:   Any allergies you have.   All medicines you are taking, including vitamins, herbs, eye drops, creams, and over-the-counter medicines.   Any problems you or family members have had with anesthetic medicines.   Any blood disorders you have.   Any surgeries you have had.   Any medical conditions you have.  What are the risks?  Generally, this is a safe procedure. However, problems may occur, including:   Infection.   Bleeding.   Allergic reaction to medicines.   Scarring.    What happens before the procedure?   Ask your health care provider about changing or stopping your regular medicines. This is especially important if you are taking diabetes medicines or blood thinners.   Follow instructions from your health care provider about how to care for your skin.   Ask your health care provider how your biopsy site will be marked or identified.   You may be given antibiotic medicine to prevent infection.  What happens during the procedure?   To reduce your risk of infection:  ? Your health care team will wash or sanitize their hands.  ? Your skin will be washed with soap.   You may be given a medicine to help you relax (sedative).   You will be givena medicine to numb the area (local anesthetic).   The method that your health care provider will use for your skin biopsy will depend on the type of skin problem you have. Options include:  ? Shave biopsy. Your health care provider will shave away layers of your skin lesion with a sharp blade. After shaving, a gel or ointment may be used to control bleeding.  ? Punch biopsy. Your health care provider will use a tool to remove all or part of the lesion. This leaves a small hole about the width of a pencil eraser.  The area may be covered with a gel or ointment.  ? Excisional or incisional biopsy. Your health care provider will use a surgical blade to remove all or part of your lesion.   Your skin biopsy site may be closed with stitches (sutures).   A bandage (dressing) will be applied.  The procedure may vary among health care providers and hospitals.  What happens after the procedure?   Your skin sample will be sent to a laboratory for examination.   Your skin biopsy site will be watched to make sure that it stops bleeding.   Do not drive for 24 hours if you received a sedative.  This information is not intended to replace advice given to you by your health care provider. Make sure you discuss any questions you have with your health care provider.  Document Released: 04/19/2004 Document Revised: 11/12/2015 Document Reviewed: 06/15/2014  Elsevier Interactive Patient Education  2018 Elsevier Inc.

## 2017-06-09 NOTE — Progress Notes (Signed)
Subjective:    Patient ID: Stephanie Aguilar, female    DOB: May 21, 1940, 77 y.o.   MRN: [id number]   Chief Complaint  Patient presents with  . Hypertension  . Follow-up  . Medication Refill   HPI  Stephanie Aguilar is a delightul 77 yo owman here to f/u on her HTN.  Hypertension Blood pressure at home: Checks a couple times a week outside office. Usually BP 810-175 systolic and 10-25 diastolic.  Did have several episodes in February in the 140s-150s so she took an extra amlodipine just once which worked well.  Exercise: Is active, walks 1.5 miles several times a week and also gardens  Low salt diet: Sometimes adherent. Eats lots of fruits and vegetables  Medications: Compliant with Amlodipine 2.5 mg daily and olmesartan 40 . Also taking Acebutolol 200 mg daily for the last 4-5 years.  Side effects: None  She has previously followed with Dr. Einar Gip in the past for shoulder pain that they thought may be cardiac- stress test was negative Sept 2016.   Hyperlipidemia      Lab Results  Component Value Date   LDLCALC 53 04/12/2016   Symptoms Chest pain on exertion: Snone Leg claudication: No  Medications(modifying factor): Crestor 2.5 mg daily. Was taking a higher dose at one point but did not tolerate.  Compliance- Good   Anxiety: Stable. Takes Lorazepam before bed. Takes 1-2 pills a day. Has been taking for about 20 years. Denies any panic attacks. Using Lorazepam mostly to help her sleep. She has not tried any other medications for anxiety.   Vitamin D 2000u.  Takes lorazepam 0.5 qhs and occasionally repeats at night, very rarely during the day.  Past Medical History:  Diagnosis Date  . Allergy   . Anxiety   . Cataract   . Depression   . Hyperlipidemia   . Hypertension    Past Surgical History:  Procedure Laterality Date  . ABDOMINAL HYSTERECTOMY    . APPENDECTOMY    . BREAST SURGERY    . DENTAL SURGERY    . TONSILLECTOMY AND ADENOIDECTOMY     Current  Outpatient Medications on File Prior to Visit  Medication Sig Dispense Refill  . acebutolol (SECTRAL) 200 MG capsule TAKE (1) CAPSULE DAILY. 90 capsule 0  . ALREX 0.2 % SUSP     . amLODipine (NORVASC) 2.5 MG tablet Take 1 tablet (2.5 mg total) by mouth daily. 90 tablet 3  . aspirin 81 MG tablet Take 81 mg by mouth daily.    . Cholecalciferol (VITAMIN D) 2000 units CAPS Take 1 capsule by mouth daily.    . clobetasol (TEMOVATE) 0.05 % external solution Apply 10 drops to scalp every night at bedtime. 50 mL 11  . cycloSPORINE (RESTASIS) 0.05 % ophthalmic emulsion 1 drop 2 (two) times daily.    Marland Kitchen desloratadine (CLARINEX) 5 MG tablet TAKE 1 TABLET DAILY AS DIRECTED. 90 tablet 0  . fluticasone (FLONASE) 50 MCG/ACT nasal spray USE 2 SPRAYS INTO EACH NOSTRIL DAILY 48 g 2  . LORazepam (ATIVAN) 0.5 MG tablet Take 1 tablet (0.5 mg total) by mouth 2 (two) times daily. 180 tablet 1  . METRONIDAZOLE, TOPICAL, 0.75 % LOTN APPLY TOPICALLY TO AFFECTED AREA. 59 mL 11  . olmesartan (BENICAR) 20 MG tablet Take 20 mg by mouth daily.    Marland Kitchen OVER THE COUNTER MEDICATION OTC Imodium taking daily for diarrrhea    . rosuvastatin (CRESTOR) 5 MG tablet Take 0.5 tablets (2.5 mg total) by mouth  daily. 45 tablet 3  . VIVELLE-DOT 0.0375 MG/24HR      No current facility-administered medications on file prior to visit.    Allergies  Allergen Reactions  . Codeine   . Erythromycin     Sick on stomach  . Latex   . Lisinopril   . Sulfa Antibiotics     rash   Family History  Problem Relation Age of Onset  . Hypertension Mother   . Alcohol abuse Father   . COPD Brother   . Hypertension Brother   . Alcohol abuse Brother   . Heart disease Maternal Grandmother   . Cancer Maternal Grandfather   . Stroke Paternal Grandmother   . Gout Brother   . Alcohol abuse Brother    Social History   Socioeconomic History  . Marital status: Married    Spouse name: Not on file  . Number of children: Not on file  . Years of  education: Not on file  . Highest education level: Not on file  Social Needs  . Financial resource strain: Not on file  . Food insecurity - worry: Not on file  . Food insecurity - inability: Not on file  . Transportation needs - medical: Not on file  . Transportation needs - non-medical: Not on file  Occupational History  . Occupation: retired/homemaker  Tobacco Use  . Smoking status: Never Smoker  . Smokeless tobacco: Never Used  Substance and Sexual Activity  . Alcohol use: Yes    Comment: 1-2 oz weekly  . Drug use: No  . Sexual activity: No  Other Topics Concern  . Not on file  Social History Narrative  . Not on file   Depression screen [facility] 2/9 12/11/2016 12/05/2016 04/12/2016 04/12/2016 09/21/2015  Decreased Interest 0 0 0 0 0  Down, Depressed, Hopeless 0 0 0 0 0  PHQ - 2 Score 0 0 0 0 0      Review of Systems  All other systems reviewed and are negative.  unless otherwise noted in hpi    Objective:   Physical Exam  Constitutional: She is oriented to person, place, and time. She appears well-developed and well-nourished. No distress.  HENT:  Head: Normocephalic and atraumatic.  Right Ear: Tympanic membrane, external ear and ear canal normal.  Left Ear: Tympanic membrane, external ear and ear canal normal.  Nose: Nose normal. No mucosal edema or rhinorrhea.  Mouth/Throat: Uvula is midline, oropharynx is clear and moist and mucous membranes are normal. No posterior oropharyngeal erythema.  Eyes: Pupils are equal, round, and reactive to light. Conjunctivae and EOM are normal. Right eye exhibits no discharge. Left eye exhibits no discharge. No scleral icterus.  Neck: Normal range of motion. Neck supple. No thyromegaly present.  Cardiovascular: Normal rate, regular rhythm, normal heart sounds and intact distal pulses.  Pulmonary/Chest: Effort normal and breath sounds normal. No respiratory distress.  Abdominal: Soft. Bowel sounds are normal. There is no tenderness.    Musculoskeletal: She exhibits no edema.  Lymphadenopathy:    She has no cervical adenopathy.  Neurological: She is alert and oriented to person, place, and time. She has normal reflexes.  Skin: Skin is warm and dry. She is not diaphoretic. No erythema.  Psychiatric: She has a normal mood and affect. Her behavior is normal.    BP 132/84   Pulse (!) 56   Temp (!) 97.5 F (36.4 C) (Oral)   Resp 18   Ht 4\' 11"  (1.499 m)   Wt 104 lb 9.6  oz (47.4 kg)   SpO2 100%   BMI 21.13 kg/m      Assessment & Plan:   1. Nevus   2. Allergic rhinitis   3. Generalized anxiety disorder   4. Mixed hyperlipidemia   5. Essential hypertension, benign     Orders Placed This Encounter  Procedures  . Comprehensive metabolic panel    Order Specific Question:   Has the patient fasted?    Answer:   Yes  . Lipid panel    Order Specific Question:   Has the patient fasted?    Answer:   Yes    Meds ordered this encounter  Medications  . desloratadine (CLARINEX) 5 MG tablet    Sig: Take 1 tablet (5 mg total) by mouth daily. as directed    Dispense:  90 tablet    Refill:  1  . LORazepam (ATIVAN) 0.5 MG tablet    Sig: Take 1 tablet (0.5 mg total) by mouth 2 (two) times daily.    Dispense:  180 tablet    Refill:  1  . acebutolol (SECTRAL) 200 MG capsule    Sig: TAKE (1) CAPSULE DAILY.    Dispense:  90 capsule    Refill:  3  . rosuvastatin (CRESTOR) 5 MG tablet    Sig: Take 0.5 tablets (2.5 mg total) by mouth daily.    Dispense:  45 tablet    Refill:  3    Pt does not need currently. Please add this on as refills to current rx.  Marland Kitchen amLODipine (NORVASC) 2.5 MG tablet    Sig: Take 1 tablet (2.5 mg total) by mouth daily.    Dispense:  90 tablet    Refill:  3    Pt does not need currently. Please add this on as refills to current rx.  . olmesartan (BENICAR) 20 MG tablet    Sig: Take 2 tablets (40 mg total) by mouth daily.    Dispense:  180 tablet    Refill:  0    To be used in place of  olmesartan 40 qd while it is out of stock    I personally performed the services described in this documentation, which was scribed in my presence. The recorded information has been reviewed and considered, and addended by me as needed.   Delman Cheadle, M.D.  Primary Care at [facility] [address] [place], [place] [address] [phone] phone [phone] fax  12/08/17 11:56 PM

## 2017-06-11 LAB — COMPREHENSIVE METABOLIC PANEL
A/G RATIO: 1.7 (ref 1.2–2.2)
ALT: 15 IU/L (ref 0–32)
AST: 19 IU/L (ref 0–40)
Albumin: 4.3 g/dL (ref 3.5–4.8)
Alkaline Phosphatase: 77 IU/L (ref 39–117)
BILIRUBIN TOTAL: 0.3 mg/dL (ref 0.0–1.2)
BUN/Creatinine Ratio: 17 (ref 12–28)
BUN: 16 mg/dL (ref 8–27)
CHLORIDE: 104 mmol/L (ref 96–106)
CO2: 23 mmol/L (ref 20–29)
Calcium: 9.8 mg/dL (ref 8.7–10.3)
Creatinine, Ser: 0.96 mg/dL (ref 0.57–1.00)
GFR calc non Af Amer: 58 mL/min/{1.73_m2} — ABNORMAL LOW (ref >59)
GFR, EST AFRICAN AMERICAN: 66 mL/min/{1.73_m2} (ref >59)
Globulin, Total: 2.5 g/dL (ref 1.5–4.5)
Glucose: 83 mg/dL (ref 65–99)
POTASSIUM: 4.9 mmol/L (ref 3.5–5.2)
Sodium: 141 mmol/L (ref 134–144)
TOTAL PROTEIN: 6.8 g/dL (ref 6.0–8.5)

## 2017-06-11 LAB — LIPID PANEL
CHOLESTEROL TOTAL: 139 mg/dL (ref 100–199)
Chol/HDL Ratio: 2.3 ratio (ref 0.0–4.4)
HDL: 60 mg/dL (ref >39)
LDL Calculated: 46 mg/dL (ref 0–99)
TRIGLYCERIDES: 163 mg/dL — AB (ref 0–149)
VLDL CHOLESTEROL CAL: 33 mg/dL (ref 5–40)

## 2017-06-30 ENCOUNTER — Ambulatory Visit (INDEPENDENT_AMBULATORY_CARE_PROVIDER_SITE_OTHER): Payer: Medicare Other | Admitting: Family Medicine

## 2017-06-30 ENCOUNTER — Other Ambulatory Visit: Payer: Self-pay

## 2017-06-30 ENCOUNTER — Encounter: Payer: Self-pay | Admitting: Family Medicine

## 2017-06-30 VITALS — BP 124/60 | HR 61 | Temp 97.5°F | Resp 18 | Ht 59.0 in | Wt 106.2 lb

## 2017-06-30 DIAGNOSIS — D229 Melanocytic nevi, unspecified: Secondary | ICD-10-CM | POA: Diagnosis not present

## 2017-06-30 DIAGNOSIS — D225 Melanocytic nevi of trunk: Secondary | ICD-10-CM

## 2017-06-30 DIAGNOSIS — L432 Lichenoid drug reaction: Secondary | ICD-10-CM | POA: Diagnosis not present

## 2017-06-30 DIAGNOSIS — L820 Inflamed seborrheic keratosis: Secondary | ICD-10-CM | POA: Diagnosis not present

## 2017-06-30 DIAGNOSIS — L821 Other seborrheic keratosis: Secondary | ICD-10-CM | POA: Diagnosis not present

## 2017-06-30 NOTE — Patient Instructions (Addendum)
     IF you received an x-ray today, you will receive an invoice from [facility]. Please contact [facility] at [phone] with questions or concerns regarding your invoice.   IF you received labwork today, you will receive an invoice from [facility]. Please contact [facility] at [phone] with questions or concerns regarding your invoice.   Our billing staff will not be able to assist you with questions regarding bills from these companies.  You will be contacted with the lab results as soon as they are available. The fastest way to get your results is to activate your My Chart account. Instructions are located on the last page of this paperwork. If you have not heard from us regarding the results in 2 weeks, please contact this office.      Skin Biopsy, Care After Refer to this sheet in the next few weeks. These instructions provide you with information about caring for yourself after your procedure. Your health care provider may also give you more specific instructions. Your treatment has been planned according to current medical practices, but problems sometimes occur. Call your health care provider if you have any problems or questions after your procedure. What can I expect after the procedure? After the procedure, it is common to have:  Soreness.  Bruising.  Itching.  Follow these instructions at home:  Rest and then return to your normal activities as told by your health care provider.  Take over-the-counter and prescription medicines only as told by your health care provider.  Follow instructions from your health care provider about how to take care of your biopsy site.Make sure you: ? Wash your hands with soap and water before you change your bandage (dressing). If soap and water are not available, use hand sanitizer. ? Change your dressing as told by your health care provider. ? Leave stitches (sutures), skin glue, or adhesive strips in place. These  skin closures may need to stay in place for 2 weeks or longer. If adhesive strip edges start to loosen and curl up, you may trim the loose edges. Do not remove adhesive strips completely unless your health care provider tells you to do that. If the biopsy area bleeds, apply gentle pressure for 10 minutes.  Check your biopsy site every day for signs of infection. Check for: ? More redness, swelling, or pain. ? More fluid or blood. ? Warmth. ? Pus or a bad smell.  Keep all follow-up visits as told by your health care provider. This is important. Contact a health care provider if:  You have more redness, swelling, or pain around your biopsy site.  You have more fluid or blood coming from your biopsy site.  Your biopsy site feels warm to the touch.  You have pus or a bad smell coming from your biopsy site.  You have a fever. Get help right away if:  You have bleeding that does not stop with pressure or a dressing. This information is not intended to replace advice given to you by your health care provider. Make sure you discuss any questions you have with your health care provider. Document Released: 04/14/2015 Document Revised: 11/12/2015 Document Reviewed: 06/15/2014 Elsevier Interactive Patient Education  2018 Elsevier Inc.  

## 2017-06-30 NOTE — Progress Notes (Signed)
Subjective:  By signing my name below, I, [name], attest that this documentation has been prepared under the direction and in the presence of Delman Cheadle, MD. Electronically Signed: [name], [place]. 06/30/2017 , 3:08 PM .  Patient was seen in Room 1 .   Patient ID: Stephanie Aguilar, female    DOB: 1940-04-10, 77 y.o.   MRN: [id number] Chief Complaint  Patient presents with  . Nevus    left shoulder, pt would like to have mole removed.   HPI Stephanie Aguilar is a 77 y.o. female who presents to Primary Care at [facility] for removal of suspicious mole that is causing irritation   Past Medical History:  Diagnosis Date  . Allergy   . Anxiety   . Cataract   . Depression   . Hyperlipidemia   . Hypertension    Past Surgical History:  Procedure Laterality Date  . ABDOMINAL HYSTERECTOMY    . APPENDECTOMY    . BREAST SURGERY    . DENTAL SURGERY    . TONSILLECTOMY AND ADENOIDECTOMY     Prior to Admission medications   Medication Sig Start Date End Date Taking? Authorizing Provider  acebutolol (SECTRAL) 200 MG capsule TAKE (1) CAPSULE DAILY. 06/09/17  Yes Shawnee Knapp, MD  amLODipine (NORVASC) 2.5 MG tablet Take 1 tablet (2.5 mg total) by mouth daily. 06/09/17  Yes Shawnee Knapp, MD  aspirin 81 MG tablet Take 81 mg by mouth daily.   Yes [provider]  Cholecalciferol (VITAMIN D) 2000 units CAPS Take 1 capsule by mouth daily.   Yes [provider]  clobetasol (TEMOVATE) 0.05 % external solution Apply 10 drops to scalp every night at bedtime. 04/11/15  Yes Darlyne Russian, MD  cycloSPORINE (RESTASIS) 0.05 % ophthalmic emulsion 1 drop 2 (two) times daily.   Yes [provider]  desloratadine (CLARINEX) 5 MG tablet Take 1 tablet (5 mg total) by mouth daily. as directed 06/09/17  Yes Shawnee Knapp, MD  fluticasone [facility]) 50 MCG/ACT nasal spray USE 2 SPRAYS INTO EACH NOSTRIL DAILY 09/20/14  Yes Daub, Loura Back, MD  LORazepam (ATIVAN) 0.5 MG tablet  Take 1 tablet (0.5 mg total) by mouth 2 (two) times daily. 06/09/17  Yes Shawnee Knapp, MD  METRONIDAZOLE, TOPICAL, 0.75 % LOTN APPLY TOPICALLY TO AFFECTED AREA. 09/21/15  Yes Daub, Loura Back, MD  olmesartan (BENICAR) 20 MG tablet Take 2 tablets (40 mg total) by mouth daily. 06/09/17  Yes Shawnee Knapp, MD  OVER THE COUNTER MEDICATION OTC Imodium taking daily for diarrrhea   Yes [provider]  rosuvastatin (CRESTOR) 5 MG tablet Take 0.5 tablets (2.5 mg total) by mouth daily. 06/09/17  Yes Shawnee Knapp, MD  VIVELLE-DOT 0.0375 MG/24HR  09/05/14  Yes [provider]   Allergies  Allergen Reactions  . Codeine   . Erythromycin     Sick on stomach  . Latex   . Lisinopril   . Sulfa Antibiotics     rash   Family History  Problem Relation Age of Onset  . Hypertension Mother   . Alcohol abuse Father   . COPD Brother   . Hypertension Brother   . Alcohol abuse Brother   . Heart disease Maternal Grandmother   . Cancer Maternal Grandfather   . Stroke Paternal Grandmother   . Gout Brother   . Alcohol abuse Brother    Social History   Socioeconomic History  . Marital status: Married  Spouse name: Not on file  . Number of children: Not on file  . Years of education: Not on file  . Highest education level: Not on file  Occupational History  . Occupation: Animal nutritionist  Social Needs  . Financial resource strain: Not on file  . Food insecurity:    Worry: Not on file    Inability: Not on file  . Transportation needs:    Medical: Not on file    Non-medical: Not on file  Tobacco Use  . Smoking status: Never Smoker  . Smokeless tobacco: Never Used  Substance and Sexual Activity  . Alcohol use: Yes    Comment: 1-2 oz weekly  . Drug use: No  . Sexual activity: Never  Lifestyle  . Physical activity:    Days per week: Not on file    Minutes per session: Not on file  . Stress: Not on file  Relationships  . Social connections:    Talks on phone: Not on file    Gets  together: Not on file    Attends religious service: Not on file    Active member of club or organization: Not on file    Attends meetings of clubs or organizations: Not on file    Relationship status: Not on file  Other Topics Concern  . Not on file  Social History Narrative  . Not on file   Depression screen [facility] 2/9 06/30/2017 12/11/2016 12/05/2016 04/12/2016 04/12/2016  Decreased Interest 0 0 0 0 0  Down, Depressed, Hopeless 0 0 0 0 0  PHQ - 2 Score 0 0 0 0 0    Review of Systems     Objective:   Physical Exam  Constitutional: She is oriented to person, place, and time. She appears well-developed and well-nourished. No distress.  HENT:  Head: Normocephalic and atraumatic.  Eyes: Pupils are equal, round, and reactive to light. EOM are normal.  Neck: Neck supple.  Cardiovascular: Normal rate.  Pulmonary/Chest: Effort normal. No respiratory distress.  Musculoskeletal: Normal range of motion.  Neurological: She is alert and oriented to person, place, and time.  Skin: Skin is warm and dry.  Psychiatric: She has a normal mood and affect. Her behavior is normal.  Nursing note and vitals reviewed.   BP 124/60 (BP Location: Left Arm, Patient Position: Sitting, Cuff Size: Normal)   Pulse 61   Temp (!) 97.5 F (36.4 C) (Oral)   Resp 18   Ht 4\' 11"  (1.499 m)   Wt 106 lb 3.2 oz (48.2 kg)   SpO2 97%   BMI 21.45 kg/m      Assessment & Plan:   1. Nevus    Biopsy above by Carlis Abbott PA  Delman Cheadle, MD, MPH Primary Care at [place] [address] [place], [place]  [address] [phone] Office phone  [phone] Office fax   12/26/17 4:51 AM

## 2017-06-30 NOTE — Progress Notes (Signed)
Risk and benefits discussed and verbal consent obtained. Skin overlying the left trapezius prepped with betadine.  Skin anesthetized using 2% lido with epi.  2 lesions removed via shave biopsy.  Hemostasis achieved with pressure dressing.  Pt tolerated procedure without complaint.  Philis Fendt, MS, PA-C 5:52 PM, 06/30/2017

## 2017-09-23 ENCOUNTER — Encounter: Payer: Self-pay | Admitting: Emergency Medicine

## 2017-09-23 ENCOUNTER — Ambulatory Visit (INDEPENDENT_AMBULATORY_CARE_PROVIDER_SITE_OTHER): Payer: Medicare Other | Admitting: Emergency Medicine

## 2017-09-23 VITALS — BP 168/72 | HR 60 | Temp 97.4°F | Resp 17 | Ht 59.0 in | Wt 104.0 lb

## 2017-09-23 DIAGNOSIS — L089 Local infection of the skin and subcutaneous tissue, unspecified: Secondary | ICD-10-CM

## 2017-09-23 DIAGNOSIS — W57XXXA Bitten or stung by nonvenomous insect and other nonvenomous arthropods, initial encounter: Secondary | ICD-10-CM | POA: Diagnosis not present

## 2017-09-23 MED ORDER — DOXYCYCLINE HYCLATE 100 MG PO TABS: 100.0000 mg | ORAL_TABLET | Freq: Two times a day (BID) | ORAL | 0 refills | Status: AC

## 2017-09-23 NOTE — Progress Notes (Signed)
Stephanie Aguilar 77 y.o.   Chief Complaint  Patient presents with  . Tick Removal    HISTORY OF PRESENT ILLNESS: This is a 77 y.o. female complaining of tick bite to right lower leg.  Most likely happened last Saturday night most likely was not attached more than several hours.  Removed by her husband.  Has been applying Neosporin.  Complaining of redness and possible infection.  HPI   Prior to Admission medications   Medication Sig Start Date End Date Taking? Authorizing Provider  acebutolol (SECTRAL) 200 MG capsule TAKE (1) CAPSULE DAILY. 06/09/17  Yes Shawnee Knapp, MD  amLODipine (NORVASC) 2.5 MG tablet Take 1 tablet (2.5 mg total) by mouth daily. 06/09/17  Yes Shawnee Knapp, MD  aspirin 81 MG tablet Take 81 mg by mouth daily.   Yes [provider]  Cholecalciferol (VITAMIN D) 2000 units CAPS Take 1 capsule by mouth daily.   Yes [provider]  clobetasol (TEMOVATE) 0.05 % external solution Apply 10 drops to scalp every night at bedtime. 04/11/15  Yes Darlyne Russian, MD  cycloSPORINE (RESTASIS) 0.05 % ophthalmic emulsion 1 drop 2 (two) times daily.   Yes [provider]  desloratadine (CLARINEX) 5 MG tablet Take 1 tablet (5 mg total) by mouth daily. as directed 06/09/17  Yes Shawnee Knapp, MD  fluticasone [facility]) 50 MCG/ACT nasal spray USE 2 SPRAYS INTO EACH NOSTRIL DAILY 09/20/14  Yes Daub, Loura Back, MD  LORazepam (ATIVAN) 0.5 MG tablet Take 1 tablet (0.5 mg total) by mouth 2 (two) times daily. 06/09/17  Yes Shawnee Knapp, MD  METRONIDAZOLE, TOPICAL, 0.75 % LOTN APPLY TOPICALLY TO AFFECTED AREA. 09/21/15  Yes Daub, Loura Back, MD  olmesartan (BENICAR) 20 MG tablet Take 2 tablets (40 mg total) by mouth daily. 06/09/17  Yes Shawnee Knapp, MD  OVER THE COUNTER MEDICATION OTC Imodium taking daily for diarrrhea   Yes [provider]  rosuvastatin (CRESTOR) 5 MG tablet Take 0.5 tablets (2.5 mg total) by mouth daily. 06/09/17  Yes Shawnee Knapp, MD  VIVELLE-DOT  0.0375 MG/24HR  09/05/14  Yes [provider]    Allergies  Allergen Reactions  . Codeine   . Erythromycin     Sick on stomach  . Latex   . Lisinopril   . Sulfa Antibiotics     rash    Patient Active Problem List   Diagnosis Date Noted  . CKD (chronic kidney disease), stage III ([place]) 04/13/2016  . PAD (peripheral artery disease) ([place]) 04/12/2016  . Anxiety disorder 11/07/2015  . Rosacea, acne 11/07/2015  . Eczema 11/07/2015  . Carotid bruit 07/06/2013  . Essential hypertension 02/04/2012  . Hyperlipidemia 02/04/2012  . Alopecia areata 02/04/2012  . Menopausal syndrome on hormone replacement therapy 02/04/2012  . Diarrhea 12/10/2011    Past Medical History:  Diagnosis Date  . Allergy   . Anxiety   . Cataract   . Depression   . Hyperlipidemia   . Hypertension     Past Surgical History:  Procedure Laterality Date  . ABDOMINAL HYSTERECTOMY    . APPENDECTOMY    . BREAST SURGERY    . DENTAL SURGERY    . TONSILLECTOMY AND ADENOIDECTOMY      Social History   Socioeconomic History  . Marital status: Married    Spouse name: Not on file  . Number of children: Not on file  . Years of education: Not on file  . Highest education level: Not on  file  Occupational History  . Occupation: Animal nutritionist  Social Needs  . Financial resource strain: Not on file  . Food insecurity:    Worry: Not on file    Inability: Not on file  . Transportation needs:    Medical: Not on file    Non-medical: Not on file  Tobacco Use  . Smoking status: Never Smoker  . Smokeless tobacco: Never Used  Substance and Sexual Activity  . Alcohol use: Yes    Comment: 1-2 oz weekly  . Drug use: No  . Sexual activity: Never  Lifestyle  . Physical activity:    Days per week: Not on file    Minutes per session: Not on file  . Stress: Not on file  Relationships  . Social connections:    Talks on phone: Not on file    Gets together: Not on file    Attends religious service: Not  on file    Active member of club or organization: Not on file    Attends meetings of clubs or organizations: Not on file    Relationship status: Not on file  . Intimate partner violence:    Fear of current or ex partner: Not on file    Emotionally abused: Not on file    Physically abused: Not on file    Forced sexual activity: Not on file  Other Topics Concern  . Not on file  Social History Narrative  . Not on file    Family History  Problem Relation Age of Onset  . Hypertension Mother   . Alcohol abuse Father   . COPD Brother   . Hypertension Brother   . Alcohol abuse Brother   . Heart disease Maternal Grandmother   . Cancer Maternal Grandfather   . Stroke Paternal Grandmother   . Gout Brother   . Alcohol abuse Brother      Review of Systems  Constitutional: Negative.  Negative for chills and fever.  Respiratory: Negative for cough and shortness of breath.   Cardiovascular: Negative for chest pain and palpitations.  Gastrointestinal: Negative for nausea and vomiting.  Skin: Positive for rash.  Neurological: Negative.  Negative for dizziness and headaches.  Endo/Heme/Allergies: Negative.     Vitals:   09/23/17 1644  BP: (!) 168/72  Pulse: 60  Resp: 17  Temp: (!) 97.4 F (36.3 C)  SpO2: 98%    Physical Exam  Constitutional: She is oriented to person, place, and time. She appears well-developed and well-nourished.  HENT:  Head: Normocephalic and atraumatic.  Eyes: Pupils are equal, round, and reactive to light.  Neck: Normal range of motion.  Cardiovascular: Normal rate.  Pulmonary/Chest: Effort normal.  Musculoskeletal: Normal range of motion.  Neurological: She is alert and oriented to person, place, and time.  Skin: Skin is warm and dry. Capillary refill takes less than 2 seconds. Rash noted.  Psychiatric: She has a normal mood and affect. Her behavior is normal.  Vitals reviewed.  .    ASSESSMENT & PLAN: Stephanie Aguilar was seen today for tick  removal.  Diagnoses and all orders for this visit:  Skin infection -     doxycycline (VIBRA-TABS) 100 MG tablet; Take 1 tablet (100 mg total) by mouth 2 (two) times daily for 7 days.  Tick bite, initial encounter    Patient Instructions       IF you received an x-ray today, you will receive an invoice from [facility]. Please contact [facility] at [phone] with questions or concerns  regarding your invoice.   IF you received labwork today, you will receive an invoice from [facility]. Please contact [facility] at [phone] with questions or concerns regarding your invoice.   Our billing staff will not be able to assist you with questions regarding bills from these companies.  You will be contacted with the lab results as soon as they are available. The fastest way to get your results is to activate your My Chart account. Instructions are located on the last page of this paperwork. If you have not heard from [place] regarding the results in 2 weeks, please contact this office.      Tick Bite Information, Adult Ticks are insects that can bite. Most ticks live in shrubs and grassy areas. They climb onto people and animals that go by. Then they bite. Some ticks carry germs that can make you sick. How can I prevent tick bites?  Use an insect repellent that has 20% or higher of the ingredients DEET, picaridin, or IR3535. Put this insect repellent on: ? Bare skin. ? The tops of your boots. ? Your pant legs. ? The ends of your sleeves.  If you use an insect repellent that has the ingredient permethrin, make sure to follow the instructions on the bottle. Treat the following: ? Clothing. ? Supplies. ? Boots. ? Tents.  Wear long sleeves, long pants, and light colors.  Tuck your pant legs into your socks.  Stay in the middle of the trail.  Try not to walk through long grass.  Before going inside your house, check your clothes, hair, and skin for ticks. Make sure  to check your head, neck, armpits, waist, groin, and joint areas.  Check for ticks every day.  When you come indoors: ? Wash your clothes right away. ? Shower right away. ? Dry your clothes in a dryer on high heat for 60 minutes or more. What is the right way to remove a tick? Remove a tick from your skin as soon as possible.  To remove a tick that is crawling on your skin: ? Go outdoors and brush the tick off. ? Use tape or a lint roller.  To remove a tick that is biting: ? Wash your hands. ? If you have latex gloves, put them on. ? Use tweezers, curved forceps, or a tick-removal tool to grasp the tick. Grasp the tick as close to your skin and as close to the tick's head as possible. ? Gently pull up until the tick lets go.  Try to keep the tick's head attached to its body.  Do not twist or jerk the tick.  Do not squeeze or crush the tick.  Do not try to remove a tick with heat, alcohol, petroleum jelly, or fingernail polish. How should I get rid of a tick? Here are some ways to get rid of a tick that is alive:  Place the tick in rubbing alcohol.  Place the tick in a bag or container you can close tightly.  Wrap the tick tightly in tape.  Flush the tick down the toilet.  Contact a doctor if:  You have symptoms of a disease, such as: ? Pain in a muscle, joint, or bone. ? Trouble walking or moving your legs. ? Numbness in your legs. ? Inability to move (paralysis). ? A red rash that makes a circle (bull's-eye rash). ? Redness and swelling where the tick bit you. ? A fever. ? Throwing up (vomiting) over and over. ? Diarrhea. ? Weight loss. ?  Tender and swollen lymph glands. ? Shortness of breath. ? Cough. ? Belly pain (abdominal pain). ? Headache. ? Being more tired than normal. ? A change in how alert (conscious) you are. ? Confusion. Get help right away if:  You cannot remove a tick.  A part of a tick breaks off and gets stuck in your skin.  You are  feeling worse. Summary  Ticks may carry germs that can make you sick.  To prevent tick bites, wear long sleeves, long pants, and light colors. Use insect repellent. Follow the instructions on the bottle.  If the tick is biting, do not try to remove it with heat, alcohol, petroleum jelly, or fingernail polish.  Use tweezers, curved forceps, or a tick-removal tool to grasp the tick. Gently pull up until the tick lets go. Do not twist or jerk the tick. Do not squeeze or crush the tick.  If you have symptoms, contact a doctor. This information is not intended to replace advice given to you by your health care provider. Make sure you discuss any questions you have with your health care provider. Document Released: 06/12/2009 Document Revised: 06/28/2016 Document Reviewed: 06/28/2016 Elsevier Interactive Patient Education  2018 [employer].      Agustina Caroli, MD [facility]

## 2017-09-23 NOTE — Patient Instructions (Addendum)
IF you received an x-ray today, you will receive an invoice from [facility]. Please contact [facility] at [phone] with questions or concerns regarding your invoice.   IF you received labwork today, you will receive an invoice from [facility]. Please contact [facility] at [phone] with questions or concerns regarding your invoice.   Our billing staff will not be able to assist you with questions regarding bills from these companies.  You will be contacted with the lab results as soon as they are available. The fastest way to get your results is to activate your My Chart account. Instructions are located on the last page of this paperwork. If you have not heard from [place] regarding the results in 2 weeks, please contact this office.      Tick Bite Information, Adult Ticks are insects that can bite. Most ticks live in shrubs and grassy areas. They climb onto people and animals that go by. Then they bite. Some ticks carry germs that can make you sick. How can I prevent tick bites?  Use an insect repellent that has 20% or higher of the ingredients DEET, picaridin, or IR3535. Put this insect repellent on: ? Bare skin. ? The tops of your boots. ? Your pant legs. ? The ends of your sleeves.  If you use an insect repellent that has the ingredient permethrin, make sure to follow the instructions on the bottle. Treat the following: ? Clothing. ? Supplies. ? Boots. ? Tents.  Wear long sleeves, long pants, and light colors.  Tuck your pant legs into your socks.  Stay in the middle of the trail.  Try not to walk through long grass.  Before going inside your house, check your clothes, hair, and skin for ticks. Make sure to check your head, neck, armpits, waist, groin, and joint areas.  Check for ticks every day.  When you come indoors: ? Wash your clothes right away. ? Shower right away. ? Dry your clothes in a dryer on high heat for 60 minutes or more. What  is the right way to remove a tick? Remove a tick from your skin as soon as possible.  To remove a tick that is crawling on your skin: ? Go outdoors and brush the tick off. ? Use tape or a lint roller.  To remove a tick that is biting: ? Wash your hands. ? If you have latex gloves, put them on. ? Use tweezers, curved forceps, or a tick-removal tool to grasp the tick. Grasp the tick as close to your skin and as close to the tick's head as possible. ? Gently pull up until the tick lets go.  Try to keep the tick's head attached to its body.  Do not twist or jerk the tick.  Do not squeeze or crush the tick.  Do not try to remove a tick with heat, alcohol, petroleum jelly, or fingernail polish. How should I get rid of a tick? Here are some ways to get rid of a tick that is alive:  Place the tick in rubbing alcohol.  Place the tick in a bag or container you can close tightly.  Wrap the tick tightly in tape.  Flush the tick down the toilet.  Contact a doctor if:  You have symptoms of a disease, such as: ? Pain in a muscle, joint, or bone. ? Trouble walking or moving your legs. ? Numbness in your legs. ? Inability to move (paralysis). ? A red rash that makes a  circle (bull's-eye rash). ? Redness and swelling where the tick bit you. ? A fever. ? Throwing up (vomiting) over and over. ? Diarrhea. ? Weight loss. ? Tender and swollen lymph glands. ? Shortness of breath. ? Cough. ? Belly pain (abdominal pain). ? Headache. ? Being more tired than normal. ? A change in how alert (conscious) you are. ? Confusion. Get help right away if:  You cannot remove a tick.  A part of a tick breaks off and gets stuck in your skin.  You are feeling worse. Summary  Ticks may carry germs that can make you sick.  To prevent tick bites, wear long sleeves, long pants, and light colors. Use insect repellent. Follow the instructions on the bottle.  If the tick is biting, do not try to  remove it with heat, alcohol, petroleum jelly, or fingernail polish.  Use tweezers, curved forceps, or a tick-removal tool to grasp the tick. Gently pull up until the tick lets go. Do not twist or jerk the tick. Do not squeeze or crush the tick.  If you have symptoms, contact a doctor. This information is not intended to replace advice given to you by your health care provider. Make sure you discuss any questions you have with your health care provider. Document Released: 06/12/2009 Document Revised: 06/28/2016 Document Reviewed: 06/28/2016 Elsevier Interactive Patient Education  2018 [employer].

## 2017-11-06 DIAGNOSIS — Z124 Encounter for screening for malignant neoplasm of cervix: Secondary | ICD-10-CM | POA: Diagnosis not present

## 2017-11-06 DIAGNOSIS — Z79890 Hormone replacement therapy (postmenopausal): Secondary | ICD-10-CM | POA: Diagnosis not present

## 2017-11-06 DIAGNOSIS — Z1231 Encounter for screening mammogram for malignant neoplasm of breast: Secondary | ICD-10-CM | POA: Diagnosis not present

## 2017-11-26 DIAGNOSIS — D1801 Hemangioma of skin and subcutaneous tissue: Secondary | ICD-10-CM | POA: Diagnosis not present

## 2017-11-26 DIAGNOSIS — D692 Other nonthrombocytopenic purpura: Secondary | ICD-10-CM | POA: Diagnosis not present

## 2017-11-26 DIAGNOSIS — L719 Rosacea, unspecified: Secondary | ICD-10-CM | POA: Diagnosis not present

## 2017-11-26 DIAGNOSIS — L821 Other seborrheic keratosis: Secondary | ICD-10-CM | POA: Diagnosis not present

## 2017-11-26 DIAGNOSIS — L2084 Intrinsic (allergic) eczema: Secondary | ICD-10-CM | POA: Diagnosis not present

## 2017-11-26 DIAGNOSIS — L814 Other melanin hyperpigmentation: Secondary | ICD-10-CM | POA: Diagnosis not present

## 2017-11-26 DIAGNOSIS — D225 Melanocytic nevi of trunk: Secondary | ICD-10-CM | POA: Diagnosis not present

## 2017-12-04 ENCOUNTER — Other Ambulatory Visit: Payer: Self-pay | Admitting: Family Medicine

## 2017-12-11 ENCOUNTER — Other Ambulatory Visit: Payer: Self-pay

## 2017-12-11 ENCOUNTER — Encounter: Payer: Self-pay | Admitting: Emergency Medicine

## 2017-12-11 ENCOUNTER — Ambulatory Visit (INDEPENDENT_AMBULATORY_CARE_PROVIDER_SITE_OTHER): Payer: Medicare Other | Admitting: Emergency Medicine

## 2017-12-11 VITALS — BP 138/78 | HR 60 | Temp 98.0°F | Resp 16 | Ht 59.25 in | Wt 105.6 lb

## 2017-12-11 DIAGNOSIS — F411 Generalized anxiety disorder: Secondary | ICD-10-CM | POA: Diagnosis not present

## 2017-12-11 DIAGNOSIS — N183 Chronic kidney disease, stage 3 unspecified: Secondary | ICD-10-CM

## 2017-12-11 DIAGNOSIS — E781 Pure hyperglyceridemia: Secondary | ICD-10-CM

## 2017-12-11 DIAGNOSIS — Z1329 Encounter for screening for other suspected endocrine disorder: Secondary | ICD-10-CM

## 2017-12-11 DIAGNOSIS — J309 Allergic rhinitis, unspecified: Secondary | ICD-10-CM

## 2017-12-11 DIAGNOSIS — Z0000 Encounter for general adult medical examination without abnormal findings: Secondary | ICD-10-CM

## 2017-12-11 DIAGNOSIS — Z23 Encounter for immunization: Secondary | ICD-10-CM

## 2017-12-11 DIAGNOSIS — I10 Essential (primary) hypertension: Secondary | ICD-10-CM

## 2017-12-11 MED ORDER — DESLORATADINE 5 MG PO TABS: 5.0000 mg | ORAL_TABLET | Freq: Every day | ORAL | 1 refills | Status: DC

## 2017-12-11 MED ORDER — LORAZEPAM 0.5 MG PO TABS: 0.5000 mg | ORAL_TABLET | Freq: Two times a day (BID) | ORAL | 1 refills | Status: DC | PRN

## 2017-12-11 MED ORDER — OLMESARTAN MEDOXOMIL 40 MG PO TABS: 40.0000 mg | ORAL_TABLET | Freq: Every day | ORAL | 3 refills | Status: DC

## 2017-12-11 NOTE — Progress Notes (Addendum)
Stephanie Aguilar 77 y.o.   Chief Complaint  Patient presents with  . Annual Exam  . Medication Refill    clarinex, lorazepam and olmesartan medoxomil    HISTORY OF PRESENT ILLNESS: This is a 77 y.o. female here for her annual exam.  Has no complaints or medical concerns.  Medical records reviewed last annual exam reported the following:  Cervical CA screening: n/a s/p hysterectomy Breast CA screening: Sees GYN Dr. Ronita Hipps. Last mammogram in July by Dr. Ronita Hipps; normal. No family h/o breast CA. Last Dexa 07/08/14; normal. Lowest T score -1.4 in L femur. Does takes HRT. Supplements: Takes 2000 mg Vit D daily. Lactose intolerant. Pt eats yogurt, 1-2 cups of lactate milk and lactate ice cream daily.  Cardiac: EKG 09/04/16. Carotid US: less than 50% bilateral 04/21/15. Seen several months prior by Dr. Einar Gip. States she had her carotid arteries rechecked which looked good.  Walks 1.5 miles for exercise several times/week and gardening. Diet excellent with low salt and lots of fresh foods. Dentist: seen every 6 months; last visit was last week. Dental implant ~4 years ago that became infected and was removed. Plans to have a dental bridge placed. Optho: seen regularly by Dr. Katy Fitch.  Chronic Medical Conditions Vit D Deficiency and Vit D 20 8 months prior. HTN: monitors BP outside office. 110-120/65-80. Diovan changed to olmesartan with excellent result.  Recent diarrhea and abdominal cramping with epigastric pain. Lactase deficient.  HPI   Prior to Admission medications   Medication Sig Start Date End Date Taking? Authorizing Provider  acebutolol (SECTRAL) 200 MG capsule TAKE (1) CAPSULE DAILY. 06/09/17  Yes Shawnee Knapp, MD  amLODipine (NORVASC) 2.5 MG tablet Take 1 tablet (2.5 mg total) by mouth daily. 06/09/17  Yes Shawnee Knapp, MD  aspirin 81 MG tablet Take 81 mg by mouth daily.   Yes [provider]  Cholecalciferol (VITAMIN D) 2000 units CAPS Take 1 capsule by mouth daily.    Yes [provider]  clobetasol (TEMOVATE) 0.05 % external solution Apply 10 drops to scalp every night at bedtime. 04/11/15  Yes Darlyne Russian, MD  cycloSPORINE (RESTASIS) 0.05 % ophthalmic emulsion 1 drop 2 (two) times daily.   Yes [provider]  desloratadine (CLARINEX) 5 MG tablet Take 1 tablet (5 mg total) by mouth daily. as directed 06/09/17  Yes Shawnee Knapp, MD  fluticasone [facility]) 50 MCG/ACT nasal spray USE 2 SPRAYS INTO EACH NOSTRIL DAILY 09/20/14  Yes Daub, Loura Back, MD  LORazepam (ATIVAN) 0.5 MG tablet Take 1 tablet (0.5 mg total) by mouth 2 (two) times daily. 06/09/17  Yes Shawnee Knapp, MD  METRONIDAZOLE, TOPICAL, 0.75 % LOTN APPLY TOPICALLY TO AFFECTED AREA. 09/21/15  Yes Daub, Loura Back, MD  olmesartan (BENICAR) 20 MG tablet Take 2 tablets (40 mg total) by mouth daily. 06/09/17  Yes Shawnee Knapp, MD  OVER THE COUNTER MEDICATION OTC Imodium taking daily for diarrrhea   Yes [provider]  rosuvastatin (CRESTOR) 5 MG tablet Take 0.5 tablets (2.5 mg total) by mouth daily. 06/09/17  Yes Shawnee Knapp, MD  VIVELLE-DOT 0.0375 MG/24HR  09/05/14  Yes [provider]  olmesartan (BENICAR) 40 MG tablet TAKE 1 TABLET ONCE DAILY. Patient not taking: Reported on 12/11/2017 12/04/17   Horald Pollen, MD    Allergies  Allergen Reactions  . Codeine   . Erythromycin     Sick on stomach  . Latex   . Lisinopril   . Sulfa  Antibiotics     rash    Patient Active Problem List   Diagnosis Date Noted  . CKD (chronic kidney disease), stage III ([place]) 04/13/2016  . PAD (peripheral artery disease) ([place]) 04/12/2016  . Anxiety disorder 11/07/2015  . Eczema 11/07/2015  . Carotid bruit 07/06/2013  . Essential hypertension 02/04/2012  . Hyperlipidemia 02/04/2012  . Menopausal syndrome on hormone replacement therapy 02/04/2012    Past Medical History:  Diagnosis Date  . Allergy   . Anxiety   . Cataract   . Depression   . Hyperlipidemia   . Hypertension      Past Surgical History:  Procedure Laterality Date  . ABDOMINAL HYSTERECTOMY    . APPENDECTOMY    . BREAST SURGERY    . DENTAL SURGERY    . TONSILLECTOMY AND ADENOIDECTOMY      Social History   Socioeconomic History  . Marital status: Married    Spouse name: Not on file  . Number of children: Not on file  . Years of education: Not on file  . Highest education level: Not on file  Occupational History  . Occupation: Animal nutritionist  Social Needs  . Financial resource strain: Not on file  . Food insecurity:    Worry: Not on file    Inability: Not on file  . Transportation needs:    Medical: Not on file    Non-medical: Not on file  Tobacco Use  . Smoking status: Never Smoker  . Smokeless tobacco: Never Used  Substance and Sexual Activity  . Alcohol use: Yes    Comment: 1-2 oz weekly  . Drug use: No  . Sexual activity: Never  Lifestyle  . Physical activity:    Days per week: Not on file    Minutes per session: Not on file  . Stress: Not on file  Relationships  . Social connections:    Talks on phone: Not on file    Gets together: Not on file    Attends religious service: Not on file    Active member of club or organization: Not on file    Attends meetings of clubs or organizations: Not on file    Relationship status: Not on file  . Intimate partner violence:    Fear of current or ex partner: Not on file    Emotionally abused: Not on file    Physically abused: Not on file    Forced sexual activity: Not on file  Other Topics Concern  . Not on file  Social History Narrative  . Not on file    Family History  Problem Relation Age of Onset  . Hypertension Mother   . Alcohol abuse Father   . COPD Brother   . Hypertension Brother   . Alcohol abuse Brother   . Heart disease Maternal Grandmother   . Cancer Maternal Grandfather   . Stroke Paternal Grandmother   . Gout Brother   . Alcohol abuse Brother      Review of Systems  Constitutional: Negative.   Negative for chills, fever and weight loss.  HENT: Negative.  Negative for hearing loss and sore throat.   Eyes: Negative.  Negative for blurred vision and double vision.  Respiratory: Negative.  Negative for cough and shortness of breath.   Cardiovascular: Negative.  Negative for chest pain and palpitations.  Gastrointestinal: Negative.  Negative for abdominal pain, diarrhea, nausea and vomiting.  Genitourinary: Negative.  Negative for dysuria and hematuria.  Musculoskeletal: Negative.  Negative for back pain, joint  pain, myalgias and neck pain.  Skin: Negative.  Negative for rash.  Neurological: Negative.  Negative for dizziness, sensory change, focal weakness, weakness and headaches.  Endo/Heme/Allergies: Negative.   All other systems reviewed and are negative.   Vitals:   12/11/17 0831  BP: 138/78  Pulse: 60  Resp: 16  Temp: 98 F (36.7 C)  SpO2: 100%    Physical Exam  Constitutional: She is oriented to person, place, and time. She appears well-developed and well-nourished.  HENT:  Head: Normocephalic.  Nose: Nose normal.  Mouth/Throat: Oropharynx is clear and moist.  Eyes: Pupils are equal, round, and reactive to light. Conjunctivae and EOM are normal.  Neck: Normal range of motion. Neck supple. No thyromegaly present.  Cardiovascular: Normal rate, regular rhythm, normal heart sounds and intact distal pulses.  Pulmonary/Chest: Effort normal and breath sounds normal.  Abdominal: Soft. Bowel sounds are normal. She exhibits no distension. There is no tenderness.  Musculoskeletal: Normal range of motion. She exhibits no edema or tenderness.  Lymphadenopathy:    She has no cervical adenopathy.  Neurological: She is alert and oriented to person, place, and time. No sensory deficit. She exhibits normal muscle tone.  Skin: Skin is warm and dry. Capillary refill takes less than 2 seconds. No rash noted.  Psychiatric: She has a normal mood and affect. Her behavior is normal.   Vitals reviewed.   A total of 25 minutes was spent in the room with the patient, greater than 50% of which was in counseling/coordination of care regarding chronic medical problems, medications, prognosis, and need for follow-up.  ASSESSMENT & PLAN: Marissah was seen today for annual exam and medication refill.  Diagnoses and all orders for this visit:  Routine general medical examination at a health care facility  Allergic rhinitis -     desloratadine (CLARINEX) 5 MG tablet; Take 1 tablet (5 mg total) by mouth daily. as directed  Generalized anxiety disorder -     LORazepam (ATIVAN) 0.5 MG tablet; Take 1 tablet (0.5 mg total) by mouth 2 (two) times daily as needed for anxiety.  Essential hypertension, benign -     olmesartan (BENICAR) 40 MG tablet; Take 1 tablet (40 mg total) by mouth daily.  Need for diphtheria-tetanus-pertussis (Tdap) vaccine -     Cancel: Tdap vaccine greater than or equal to 7yo IM -     Flu Vaccine QUAD 36+ mos IM  Need for prophylactic vaccination and inoculation against influenza  High triglycerides -     Lipid panel  CKD (chronic kidney disease), stage III (HCC) -     Comprehensive metabolic panel  Screening for thyroid disorder -     TSH   Patient Instructions       If you have lab work done today you will be contacted with your lab results within the next 2 weeks.  If you have not heard from [place] then please contact us. The fastest way to get your results is to register for My Chart.   IF you received an x-ray today, you will receive an invoice from [facility]. Please contact [facility] at [phone] with questions or concerns regarding your invoice.   IF you received labwork today, you will receive an invoice from [facility]. Please contact [facility] at [phone] with questions or concerns regarding your invoice.   Our billing staff will not be able to assist you with questions regarding bills from these  companies.  You will be contacted with the lab results as soon  as they are available. The fastest way to get your results is to activate your My Chart account. Instructions are located on the last page of this paperwork. If you have not heard from [place] regarding the results in 2 weeks, please contact this office.     Health Maintenance, Female Adopting a healthy lifestyle and getting preventive care can go a long way to promote health and wellness. Talk with your health care provider about what schedule of regular examinations is right for you. This is a good chance for you to check in with your provider about disease prevention and staying healthy. In between checkups, there are plenty of things you can do on your own. Experts have done a lot of research about which lifestyle changes and preventive measures are most likely to keep you healthy. Ask your health care provider for more information. Weight and diet Eat a healthy diet  Be sure to include plenty of vegetables, fruits, low-fat dairy products, and lean protein.  Do not eat a lot of foods high in solid fats, added sugars, or salt.  Get regular exercise. This is one of the most important things you can do for your health. ? Most adults should exercise for at least 150 minutes each week. The exercise should increase your heart rate and make you sweat (moderate-intensity exercise). ? Most adults should also do strengthening exercises at least twice a week. This is in addition to the moderate-intensity exercise.  Maintain a healthy weight  Body mass index (BMI) is a measurement that can be used to identify possible weight problems. It estimates body fat based on height and weight. Your health care provider can help determine your BMI and help you achieve or maintain a healthy weight.  For females 79 years of age and older: ? A BMI below 18.5 is considered underweight. ? A BMI of 18.5 to 24.9 is normal. ? A BMI of 25 to 29.9 is considered  overweight. ? A BMI of 30 and above is considered obese.  Watch levels of cholesterol and blood lipids  You should start having your blood tested for lipids and cholesterol at 76 years of age, then have this test every 5 years.  You may need to have your cholesterol levels checked more often if: ? Your lipid or cholesterol levels are high. ? You are older than 76 years of age. ? You are at high risk for heart disease.  Cancer screening Lung Cancer  Lung cancer screening is recommended for adults 17-74 years old who are at high risk for lung cancer because of a history of smoking.  A yearly low-dose CT scan of the lungs is recommended for people who: ? Currently smoke. ? Have quit within the past 15 years. ? Have at least a 30-pack-year history of smoking. A pack year is smoking an average of one pack of cigarettes a day for 1 year.  Yearly screening should continue until it has been 15 years since you quit.  Yearly screening should stop if you develop a health problem that would prevent you from having lung cancer treatment.  Breast Cancer  Practice breast self-awareness. This means understanding how your breasts normally appear and feel.  It also means doing regular breast self-exams. Let your health care provider know about any changes, no matter how small.  If you are in your 20s or 30s, you should have a clinical breast exam (CBE) by a health care provider every 1-3 years as part of a regular  health exam.  If you are 40 or older, have a CBE every year. Also consider having a breast X-ray (mammogram) every year.  If you have a family history of breast cancer, talk to your health care provider about genetic screening.  If you are at high risk for breast cancer, talk to your health care provider about having an MRI and a mammogram every year.  Breast cancer gene (BRCA) assessment is recommended for women who have family members with BRCA-related cancers. BRCA-related cancers  include: ? Breast. ? Ovarian. ? Tubal. ? Peritoneal cancers.  Results of the assessment will determine the need for genetic counseling and BRCA1 and BRCA2 testing.  Cervical Cancer Your health care provider may recommend that you be screened regularly for cancer of the pelvic organs (ovaries, uterus, and vagina). This screening involves a pelvic examination, including checking for microscopic changes to the surface of your cervix (Pap test). You may be encouraged to have this screening done every 3 years, beginning at age 21.  For women ages 30-65, health care providers may recommend pelvic exams and Pap testing every 3 years, or they may recommend the Pap and pelvic exam, combined with testing for human papilloma virus (HPV), every 5 years. Some types of HPV increase your risk of cervical cancer. Testing for HPV may also be done on women of any age with unclear Pap test results.  Other health care providers may not recommend any screening for nonpregnant women who are considered low risk for pelvic cancer and who do not have symptoms. Ask your health care provider if a screening pelvic exam is right for you.  If you have had past treatment for cervical cancer or a condition that could lead to cancer, you need Pap tests and screening for cancer for at least 20 years after your treatment. If Pap tests have been discontinued, your risk factors (such as having a new sexual partner) need to be reassessed to determine if screening should resume. Some women have medical problems that increase the chance of getting cervical cancer. In these cases, your health care provider may recommend more frequent screening and Pap tests.  Colorectal Cancer  This type of cancer can be detected and often prevented.  Routine colorectal cancer screening usually begins at 76 years of age and continues through 75 years of age.  Your health care provider may recommend screening at an earlier age if you have risk factors  for colon cancer.  Your health care provider may also recommend using home test kits to check for hidden blood in the stool.  A small camera at the end of a tube can be used to examine your colon directly (sigmoidoscopy or colonoscopy). This is done to check for the earliest forms of colorectal cancer.  Routine screening usually begins at age 50.  Direct examination of the colon should be repeated every 5-10 years through 75 years of age. However, you may need to be screened more often if early forms of precancerous polyps or small growths are found.  Skin Cancer  Check your skin from head to toe regularly.  Tell your health care provider about any new moles or changes in moles, especially if there is a change in a mole's shape or color.  Also tell your health care provider if you have a mole that is larger than the size of a pencil eraser.  Always use sunscreen. Apply sunscreen liberally and repeatedly throughout the day.  Protect yourself by wearing long sleeves, pants, a   wide-brimmed hat, and sunglasses whenever you are outside.  Heart disease, diabetes, and high blood pressure  High blood pressure causes heart disease and increases the risk of stroke. High blood pressure is more likely to develop in: ? People who have blood pressure in the high end of the normal range (130-139/85-89 mm Hg). ? People who are overweight or obese. ? People who are African American.  If you are 54-30 years of age, have your blood pressure checked every 3-5 years. If you are 3 years of age or older, have your blood pressure checked every year. You should have your blood pressure measured twice-once when you are at a hospital or clinic, and once when you are not at a hospital or clinic. Record the average of the two measurements. To check your blood pressure when you are not at a hospital or clinic, you can use: ? An automated blood pressure machine at a pharmacy. ? A home blood pressure monitor.  If  you are between 55 years and 21 years old, ask your health care provider if you should take aspirin to prevent strokes.  Have regular diabetes screenings. This involves taking a blood sample to check your fasting blood sugar level. ? If you are at a normal weight and have a low risk for diabetes, have this test once every three years after 76 years of age. ? If you are overweight and have a high risk for diabetes, consider being tested at a younger age or more often. Preventing infection Hepatitis B  If you have a higher risk for hepatitis B, you should be screened for this virus. You are considered at high risk for hepatitis B if: ? You were born in a country where hepatitis B is common. Ask your health care provider which countries are considered high risk. ? Your parents were born in a high-risk country, and you have not been immunized against hepatitis B (hepatitis B vaccine). ? You have HIV or AIDS. ? You use needles to inject street drugs. ? You live with someone who has hepatitis B. ? You have had sex with someone who has hepatitis B. ? You get hemodialysis treatment. ? You take certain medicines for conditions, including cancer, organ transplantation, and autoimmune conditions.  Hepatitis C  Blood testing is recommended for: ? Everyone born from [birth year] through 1964. ? Anyone with known risk factors for hepatitis C.  Sexually transmitted infections (STIs)  You should be screened for sexually transmitted infections (STIs) including gonorrhea and chlamydia if: ? You are sexually active and are younger than 76 years of age. ? You are older than 76 years of age and your health care provider tells you that you are at risk for this type of infection. ? Your sexual activity has changed since you were last screened and you are at an increased risk for chlamydia or gonorrhea. Ask your health care provider if you are at risk.  If you do not have HIV, but are at risk, it may be recommended  that you take a prescription medicine daily to prevent HIV infection. This is called pre-exposure prophylaxis (PrEP). You are considered at risk if: ? You are sexually active and do not regularly use condoms or know the HIV status of your partner(s). ? You take drugs by injection. ? You are sexually active with a partner who has HIV.  Talk with your health care provider about whether you are at high risk of being infected with HIV. If you choose to  begin PrEP, you should first be tested for HIV. You should then be tested every 3 months for as long as you are taking PrEP. Pregnancy  If you are premenopausal and you may become pregnant, ask your health care provider about preconception counseling.  If you may become pregnant, take 400 to 800 micrograms (mcg) of folic acid every day.  If you want to prevent pregnancy, talk to your health care provider about birth control (contraception). Osteoporosis and menopause  Osteoporosis is a disease in which the bones lose minerals and strength with aging. This can result in serious bone fractures. Your risk for osteoporosis can be identified using a bone density scan.  If you are 41 years of age or older, or if you are at risk for osteoporosis and fractures, ask your health care provider if you should be screened.  Ask your health care provider whether you should take a calcium or vitamin D supplement to lower your risk for osteoporosis.  Menopause may have certain physical symptoms and risks.  Hormone replacement therapy may reduce some of these symptoms and risks. Talk to your health care provider about whether hormone replacement therapy is right for you. Follow these instructions at home:  Schedule regular health, dental, and eye exams.  Stay current with your immunizations.  Do not use any tobacco products including cigarettes, chewing tobacco, or electronic cigarettes.  If you are pregnant, do not drink alcohol.  If you are  breastfeeding, limit how much and how often you drink alcohol.  Limit alcohol intake to no more than 1 drink per day for nonpregnant women. One drink equals 12 ounces of beer, 5 ounces of wine, or 1 ounces of hard liquor.  Do not use street drugs.  Do not share needles.  Ask your health care provider for help if you need support or information about quitting drugs.  Tell your health care provider if you often feel depressed.  Tell your health care provider if you have ever been abused or do not feel safe at home. This information is not intended to replace advice given to you by your health care provider. Make sure you discuss any questions you have with your health care provider. Document Released: 10/01/2010 Document Revised: 08/24/2015 Document Reviewed: 12/20/2014 Elsevier Interactive Patient Education  2018 [employer]      Agustina Caroli, MD [facility]

## 2017-12-11 NOTE — Patient Instructions (Addendum)
If you have lab work done today you will be contacted with your lab results within the next 2 weeks.  If you have not heard from [place] then please contact us. The fastest way to get your results is to register for My Chart.   IF you received an x-ray today, you will receive an invoice from [facility]. Please contact [facility] at [phone] with questions or concerns regarding your invoice.   IF you received labwork today, you will receive an invoice from [place]. Please contact [facility] at [phone] with questions or concerns regarding your invoice.   Our billing staff will not be able to assist you with questions regarding bills from these companies.  You will be contacted with the lab results as soon as they are available. The fastest way to get your results is to activate your My Chart account. Instructions are located on the last page of this paperwork. If you have not heard from [place] regarding the results in 2 weeks, please contact this office.     Health Maintenance, Female Adopting a healthy lifestyle and getting preventive care can go a long way to promote health and wellness. Talk with your health care provider about what schedule of regular examinations is right for you. This is a good chance for you to check in with your provider about disease prevention and staying healthy. In between checkups, there are plenty of things you can do on your own. Experts have done a lot of research about which lifestyle changes and preventive measures are most likely to keep you healthy. Ask your health care provider for more information. Weight and diet Eat a healthy diet  Be sure to include plenty of vegetables, fruits, low-fat dairy products, and lean protein.  Do not eat a lot of foods high in solid fats, added sugars, or salt.  Get regular exercise. This is one of the most important things you can do for your health. ? Most adults should exercise for at least 150  minutes each week. The exercise should increase your heart rate and make you sweat (moderate-intensity exercise). ? Most adults should also do strengthening exercises at least twice a week. This is in addition to the moderate-intensity exercise.  Maintain a healthy weight  Body mass index (BMI) is a measurement that can be used to identify possible weight problems. It estimates body fat based on height and weight. Your health care provider can help determine your BMI and help you achieve or maintain a healthy weight.  For females 22 years of age and older: ? A BMI below 18.5 is considered underweight. ? A BMI of 18.5 to 24.9 is normal. ? A BMI of 25 to 29.9 is considered overweight. ? A BMI of 30 and above is considered obese.  Watch levels of cholesterol and blood lipids  You should start having your blood tested for lipids and cholesterol at 76 years of age, then have this test every 5 years.  You may need to have your cholesterol levels checked more often if: ? Your lipid or cholesterol levels are high. ? You are older than 76 years of age. ? You are at high risk for heart disease.  Cancer screening Lung Cancer  Lung cancer screening is recommended for adults 58-34 years old who are at high risk for lung cancer because of a history of smoking.  A yearly low-dose CT scan of the lungs is recommended for people who: ? Currently smoke. ? Have quit  within the past 15 years. ? Have at least a 30-pack-year history of smoking. A pack year is smoking an average of one pack of cigarettes a day for 1 year.  Yearly screening should continue until it has been 15 years since you quit.  Yearly screening should stop if you develop a health problem that would prevent you from having lung cancer treatment.  Breast Cancer  Practice breast self-awareness. This means understanding how your breasts normally appear and feel.  It also means doing regular breast self-exams. Let your health care  provider know about any changes, no matter how small.  If you are in your 20s or 30s, you should have a clinical breast exam (CBE) by a health care provider every 1-3 years as part of a regular health exam.  If you are 48 or older, have a CBE every year. Also consider having a breast X-ray (mammogram) every year.  If you have a family history of breast cancer, talk to your health care provider about genetic screening.  If you are at high risk for breast cancer, talk to your health care provider about having an MRI and a mammogram every year.  Breast cancer gene (BRCA) assessment is recommended for women who have family members with BRCA-related cancers. BRCA-related cancers include: ? Breast. ? Ovarian. ? Tubal. ? Peritoneal cancers.  Results of the assessment will determine the need for genetic counseling and BRCA1 and BRCA2 testing.  Cervical Cancer Your health care provider may recommend that you be screened regularly for cancer of the pelvic organs (ovaries, uterus, and vagina). This screening involves a pelvic examination, including checking for microscopic changes to the surface of your cervix (Pap test). You may be encouraged to have this screening done every 3 years, beginning at age 8.  For women ages 8-65, health care providers may recommend pelvic exams and Pap testing every 3 years, or they may recommend the Pap and pelvic exam, combined with testing for human papilloma virus (HPV), every 5 years. Some types of HPV increase your risk of cervical cancer. Testing for HPV may also be done on women of any age with unclear Pap test results.  Other health care providers may not recommend any screening for nonpregnant women who are considered low risk for pelvic cancer and who do not have symptoms. Ask your health care provider if a screening pelvic exam is right for you.  If you have had past treatment for cervical cancer or a condition that could lead to cancer, you need Pap tests  and screening for cancer for at least 20 years after your treatment. If Pap tests have been discontinued, your risk factors (such as having a new sexual partner) need to be reassessed to determine if screening should resume. Some women have medical problems that increase the chance of getting cervical cancer. In these cases, your health care provider may recommend more frequent screening and Pap tests.  Colorectal Cancer  This type of cancer can be detected and often prevented.  Routine colorectal cancer screening usually begins at 76 years of age and continues through 76 years of age.  Your health care provider may recommend screening at an earlier age if you have risk factors for colon cancer.  Your health care provider may also recommend using home test kits to check for hidden blood in the stool.  A small camera at the end of a tube can be used to examine your colon directly (sigmoidoscopy or colonoscopy). This is done to check  for the earliest forms of colorectal cancer.  Routine screening usually begins at age 75.  Direct examination of the colon should be repeated every 5-10 years through 76 years of age. However, you may need to be screened more often if early forms of precancerous polyps or small growths are found.  Skin Cancer  Check your skin from head to toe regularly.  Tell your health care provider about any new moles or changes in moles, especially if there is a change in a mole's shape or color.  Also tell your health care provider if you have a mole that is larger than the size of a pencil eraser.  Always use sunscreen. Apply sunscreen liberally and repeatedly throughout the day.  Protect yourself by wearing long sleeves, pants, a wide-brimmed hat, and sunglasses whenever you are outside.  Heart disease, diabetes, and high blood pressure  High blood pressure causes heart disease and increases the risk of stroke. High blood pressure is more likely to develop  in: ? People who have blood pressure in the high end of the normal range (130-139/85-89 mm Hg). ? People who are overweight or obese. ? People who are African American.  If you are 41-67 years of age, have your blood pressure checked every 3-5 years. If you are 32 years of age or older, have your blood pressure checked every year. You should have your blood pressure measured twice-once when you are at a hospital or clinic, and once when you are not at a hospital or clinic. Record the average of the two measurements. To check your blood pressure when you are not at a hospital or clinic, you can use: ? An automated blood pressure machine at a pharmacy. ? A home blood pressure monitor.  If you are between 44 years and 70 years old, ask your health care provider if you should take aspirin to prevent strokes.  Have regular diabetes screenings. This involves taking a blood sample to check your fasting blood sugar level. ? If you are at a normal weight and have a low risk for diabetes, have this test once every three years after 76 years of age. ? If you are overweight and have a high risk for diabetes, consider being tested at a younger age or more often. Preventing infection Hepatitis B  If you have a higher risk for hepatitis B, you should be screened for this virus. You are considered at high risk for hepatitis B if: ? You were born in a country where hepatitis B is common. Ask your health care provider which countries are considered high risk. ? Your parents were born in a high-risk country, and you have not been immunized against hepatitis B (hepatitis B vaccine). ? You have HIV or AIDS. ? You use needles to inject street drugs. ? You live with someone who has hepatitis B. ? You have had sex with someone who has hepatitis B. ? You get hemodialysis treatment. ? You take certain medicines for conditions, including cancer, organ transplantation, and autoimmune conditions.  Hepatitis C  Blood  testing is recommended for: ? Everyone born from [birth year] through 1964. ? Anyone with known risk factors for hepatitis C.  Sexually transmitted infections (STIs)  You should be screened for sexually transmitted infections (STIs) including gonorrhea and chlamydia if: ? You are sexually active and are younger than 76 years of age. ? You are older than 76 years of age and your health care provider tells you that you are at risk for  this type of infection. ? Your sexual activity has changed since you were last screened and you are at an increased risk for chlamydia or gonorrhea. Ask your health care provider if you are at risk.  If you do not have HIV, but are at risk, it may be recommended that you take a prescription medicine daily to prevent HIV infection. This is called pre-exposure prophylaxis (PrEP). You are considered at risk if: ? You are sexually active and do not regularly use condoms or know the HIV status of your partner(s). ? You take drugs by injection. ? You are sexually active with a partner who has HIV.  Talk with your health care provider about whether you are at high risk of being infected with HIV. If you choose to begin PrEP, you should first be tested for HIV. You should then be tested every 3 months for as long as you are taking PrEP. Pregnancy  If you are premenopausal and you may become pregnant, ask your health care provider about preconception counseling.  If you may become pregnant, take 400 to 800 micrograms (mcg) of folic acid every day.  If you want to prevent pregnancy, talk to your health care provider about birth control (contraception). Osteoporosis and menopause  Osteoporosis is a disease in which the bones lose minerals and strength with aging. This can result in serious bone fractures. Your risk for osteoporosis can be identified using a bone density scan.  If you are 68 years of age or older, or if you are at risk for osteoporosis and fractures, ask your  health care provider if you should be screened.  Ask your health care provider whether you should take a calcium or vitamin D supplement to lower your risk for osteoporosis.  Menopause may have certain physical symptoms and risks.  Hormone replacement therapy may reduce some of these symptoms and risks. Talk to your health care provider about whether hormone replacement therapy is right for you. Follow these instructions at home:  Schedule regular health, dental, and eye exams.  Stay current with your immunizations.  Do not use any tobacco products including cigarettes, chewing tobacco, or electronic cigarettes.  If you are pregnant, do not drink alcohol.  If you are breastfeeding, limit how much and how often you drink alcohol.  Limit alcohol intake to no more than 1 drink per day for nonpregnant women. One drink equals 12 ounces of beer, 5 ounces of wine, or 1 ounces of hard liquor.  Do not use street drugs.  Do not share needles.  Ask your health care provider for help if you need support or information about quitting drugs.  Tell your health care provider if you often feel depressed.  Tell your health care provider if you have ever been abused or do not feel safe at home. This information is not intended to replace advice given to you by your health care provider. Make sure you discuss any questions you have with your health care provider. Document Released: 10/01/2010 Document Revised: 08/24/2015 Document Reviewed: 12/20/2014 Elsevier Interactive Patient Education  [employer].

## 2017-12-12 LAB — COMPREHENSIVE METABOLIC PANEL
A/G RATIO: 1.9 (ref 1.2–2.2)
ALBUMIN: 4.6 g/dL (ref 3.5–4.8)
ALT: 19 IU/L (ref 0–32)
AST: 23 IU/L (ref 0–40)
Alkaline Phosphatase: 71 IU/L (ref 39–117)
BUN/Creatinine Ratio: 21 (ref 12–28)
BUN: 21 mg/dL (ref 8–27)
Bilirubin Total: 0.5 mg/dL (ref 0.0–1.2)
CALCIUM: 9.8 mg/dL (ref 8.7–10.3)
CO2: 23 mmol/L (ref 20–29)
Chloride: 103 mmol/L (ref 96–106)
Creatinine, Ser: 1 mg/dL (ref 0.57–1.00)
GFR, EST AFRICAN AMERICAN: 63 mL/min/{1.73_m2} (ref >59)
GFR, EST NON AFRICAN AMERICAN: 55 mL/min/{1.73_m2} — AB (ref >59)
Globulin, Total: 2.4 g/dL (ref 1.5–4.5)
Glucose: 83 mg/dL (ref 65–99)
Potassium: 4.3 mmol/L (ref 3.5–5.2)
Sodium: 142 mmol/L (ref 134–144)
TOTAL PROTEIN: 7 g/dL (ref 6.0–8.5)

## 2017-12-12 LAB — LIPID PANEL
CHOLESTEROL TOTAL: 143 mg/dL (ref 100–199)
Chol/HDL Ratio: 2.2 ratio (ref 0.0–4.4)
HDL: 64 mg/dL (ref >39)
LDL Calculated: 52 mg/dL (ref 0–99)
TRIGLYCERIDES: 136 mg/dL (ref 0–149)
VLDL Cholesterol Cal: 27 mg/dL (ref 5–40)

## 2017-12-12 LAB — TSH: TSH: 3.59 u[IU]/mL (ref 0.450–4.500)

## 2017-12-16 ENCOUNTER — Encounter: Payer: Self-pay | Admitting: *Deleted

## 2017-12-16 NOTE — Progress Notes (Signed)
Letter sent.

## 2017-12-16 NOTE — Addendum Note (Signed)
Addended by: Davina Poke on: 12/16/2017 08:12 AM   Modules accepted: Level of Service

## 2018-05-25 ENCOUNTER — Other Ambulatory Visit: Payer: Self-pay | Admitting: Family Medicine

## 2018-05-25 DIAGNOSIS — F411 Generalized anxiety disorder: Secondary | ICD-10-CM

## 2018-05-25 NOTE — Telephone Encounter (Signed)
Copied from [place] [id number]. Topic: Quick Communication - Rx Refill/Question >> May 25, 2018  9:45 AM Reyne Dumas L wrote: Medication: LORazepam (ATIVAN) 0.5 MG tablet  Has the patient contacted their pharmacy? Yes - pharmacy states they are getting no response from [place].  Pt would like a call when this has been called in. (Agent: If no, request that the patient contact the pharmacy for the refill.) (Agent: If yes, when and what did the pharmacy advise?)  Preferred Pharmacy (with phone number or street name): [facility], [place]. [phone] (Phone) [phone] (Fax)  Agent: Please be advised that RX refills may take up to 3 business days. We ask that you follow-up with your pharmacy.

## 2018-05-28 NOTE — Telephone Encounter (Signed)
Relation to pt: self  Call back number: [phone] Pharmacy: [facility], [place] [phone] (Phone) [phone] (Fax)     Reason for call:  Patient checking on the status of medication refill, please advise

## 2018-05-29 MED ORDER — LORAZEPAM 0.5 MG PO TABS: 0.5000 mg | ORAL_TABLET | Freq: Every day | ORAL | 1 refills | Status: DC | PRN

## 2018-05-29 NOTE — Telephone Encounter (Signed)
Patient is requesting a refill of the following medications: Requested Prescriptions   Pending Prescriptions Disp Refills  . LORazepam (ATIVAN) 0.5 MG tablet 60 tablet 1    Sig: Take 1 tablet (0.5 mg total) by mouth 2 (two) times daily as needed for anxiety.    Date of patient request: 05/25/2018 Last office visit: 12/11/2017 Date of last refill: 12/11/2017 Last refill amount: 60 RF 1 Follow up time period per chart:

## 2018-06-02 ENCOUNTER — Other Ambulatory Visit: Payer: Self-pay | Admitting: Family Medicine

## 2018-06-02 ENCOUNTER — Other Ambulatory Visit: Payer: Self-pay | Admitting: Emergency Medicine

## 2018-06-02 DIAGNOSIS — J309 Allergic rhinitis, unspecified: Secondary | ICD-10-CM

## 2018-06-02 DIAGNOSIS — I10 Essential (primary) hypertension: Secondary | ICD-10-CM

## 2018-06-02 DIAGNOSIS — E782 Mixed hyperlipidemia: Secondary | ICD-10-CM

## 2018-06-02 NOTE — Telephone Encounter (Signed)
Requested Prescriptions  Pending Prescriptions Disp Refills  . acebutolol (SECTRAL) 200 MG capsule [Pharmacy Med Name: ACEBUTOLOL 200 MG CAPSULE] 90 capsule 1    Sig: TAKE (1) CAPSULE DAILY.     Cardiovascular:  Beta Blockers Passed - 06/02/2018 11:39 AM      Passed - Last BP in normal range    BP Readings from Last 1 Encounters:  12/11/17 138/78         Passed - Last Heart Rate in normal range    Pulse Readings from Last 1 Encounters:  12/11/17 60         Passed - Valid encounter within last 6 months    Recent Outpatient Visits          5 months ago Essential hypertension, benign   Primary [facility] at [facility], Ines Bloomer, MD   8 months ago Skin infection   Primary Care at [facility], Ines Bloomer, MD   11 months ago Nevus   Primary Care at [place], MD   11 months ago Nevus   [facility] at [facility], Laurey Arrow, MD   1 year ago Annual physical exam   [facility] at [facility], Laurey Arrow, MD      Future Appointments            In 6 months Sagardia, Ines Bloomer, MD Primary Care at [place], [facility]         . rosuvastatin (CRESTOR) 5 MG tablet [Pharmacy Med Name: ROSUVASTATIN CALCIUM 5 MG TAB] 45 tablet 1    Sig: TAKE 1/2 TABLET EVERY DAY.     Cardiovascular:  Antilipid - Statins Passed - 06/02/2018 11:39 AM      Passed - Total Cholesterol in normal range and within 360 days    Cholesterol, Total  Date Value Ref Range Status  12/11/2017 143 100 - 199 mg/dL Final         Passed - LDL in normal range and within 360 days    LDL Calculated  Date Value Ref Range Status  12/11/2017 52 0 - 99 mg/dL Final         Passed - HDL in normal range and within 360 days    HDL  Date Value Ref Range Status  12/11/2017 64 >39 mg/dL Final         Passed - Triglycerides in normal range and within 360 days    Triglycerides  Date Value Ref Range Status  12/11/2017 136 0 - 149 mg/dL Final         Passed - Patient is not pregnant      Passed - Valid encounter within  last 12 months    Recent Outpatient Visits          5 months ago Essential hypertension, benign   Primary Care at [facility], Ines Bloomer, MD   8 months ago Skin infection   Primary Care at [facility], Ines Bloomer, MD   11 months ago Nevus   [facility] at [facility], Laurey Arrow, MD   11 months ago Nevus   [facility] at [facility], Laurey Arrow, MD   1 year ago Annual physical exam   [facility] at [facility], Laurey Arrow, MD      Future Appointments            In 6 months Sagardia, Ines Bloomer, MD Primary Care at [place], [facility]         . amLODipine (NORVASC) 2.5 MG  tablet [Pharmacy Med Name: AMLODIPINE BESYLATE 2.5 MG TAB] 90 tablet 1    Sig: TAKE 1 TABLET EACH DAY.     Cardiovascular:  Calcium Channel Blockers Passed - 06/02/2018 11:39 AM      Passed - Last BP in normal range    BP Readings from Last 1 Encounters:  12/11/17 138/78         Passed - Valid encounter within last 6 months    Recent Outpatient Visits          5 months ago Essential hypertension, benign   Primary Care at [facility], Ines Bloomer, MD   8 months ago Skin infection   Primary Care at [facility], Ines Bloomer, MD   11 months ago Nevus   Primary Care at [place], MD   11 months ago Nevus   [facility] at [facility], Laurey Arrow, MD   1 year ago Annual physical exam   Primary Care at [facility], Laurey Arrow, MD      Future Appointments            In 6 months Sagardia, Ines Bloomer, MD Primary Care at [place], [facility]

## 2018-06-04 ENCOUNTER — Other Ambulatory Visit: Payer: Self-pay | Admitting: Emergency Medicine

## 2018-06-04 NOTE — Telephone Encounter (Signed)
This is good enough for now.  Thanks.

## 2018-06-04 NOTE — Telephone Encounter (Signed)
Pt received refills but is concerned with Alprazolam refill.  States it is less than what she usually gets.  She typically takes one per day but does sometimes still have trouble sleeping and has to take an additional pill.  Would like to know if the dose can be written as it was previously written.  Pt picked up other Rx but has not picked up this med yet.    Thank you. Williams Dietrick

## 2018-09-07 ENCOUNTER — Other Ambulatory Visit: Payer: Self-pay | Admitting: Emergency Medicine

## 2018-09-07 DIAGNOSIS — F411 Generalized anxiety disorder: Secondary | ICD-10-CM

## 2018-09-07 MED ORDER — LORAZEPAM 0.5 MG PO TABS: 0.5000 mg | ORAL_TABLET | Freq: Every day | ORAL | 1 refills | Status: DC | PRN

## 2018-10-08 ENCOUNTER — Encounter: Payer: Self-pay | Admitting: *Deleted

## 2018-11-10 DIAGNOSIS — Z1231 Encounter for screening mammogram for malignant neoplasm of breast: Secondary | ICD-10-CM | POA: Diagnosis not present

## 2018-11-10 DIAGNOSIS — Z01419 Encounter for gynecological examination (general) (routine) without abnormal findings: Secondary | ICD-10-CM | POA: Diagnosis not present

## 2018-11-10 DIAGNOSIS — Z124 Encounter for screening for malignant neoplasm of cervix: Secondary | ICD-10-CM | POA: Diagnosis not present

## 2018-11-10 LAB — HM MAMMOGRAPHY

## 2018-11-12 ENCOUNTER — Encounter: Payer: Self-pay | Admitting: *Deleted

## 2018-11-19 ENCOUNTER — Other Ambulatory Visit: Payer: Self-pay | Admitting: Emergency Medicine

## 2018-11-19 DIAGNOSIS — E782 Mixed hyperlipidemia: Secondary | ICD-10-CM

## 2018-11-19 DIAGNOSIS — J309 Allergic rhinitis, unspecified: Secondary | ICD-10-CM

## 2018-12-15 ENCOUNTER — Other Ambulatory Visit: Payer: Self-pay

## 2018-12-15 ENCOUNTER — Encounter: Payer: Self-pay | Admitting: Emergency Medicine

## 2018-12-15 ENCOUNTER — Ambulatory Visit (INDEPENDENT_AMBULATORY_CARE_PROVIDER_SITE_OTHER): Payer: Medicare Other | Admitting: Emergency Medicine

## 2018-12-15 ENCOUNTER — Other Ambulatory Visit: Payer: Self-pay | Admitting: Emergency Medicine

## 2018-12-15 VITALS — BP 140/67 | HR 56 | Temp 98.2°F | Resp 16 | Ht 59.5 in | Wt 106.4 lb

## 2018-12-15 DIAGNOSIS — Z0001 Encounter for general adult medical examination with abnormal findings: Secondary | ICD-10-CM | POA: Diagnosis not present

## 2018-12-15 DIAGNOSIS — Z0000 Encounter for general adult medical examination without abnormal findings: Secondary | ICD-10-CM

## 2018-12-15 DIAGNOSIS — I10 Essential (primary) hypertension: Secondary | ICD-10-CM | POA: Diagnosis not present

## 2018-12-15 DIAGNOSIS — Z23 Encounter for immunization: Secondary | ICD-10-CM

## 2018-12-15 DIAGNOSIS — E782 Mixed hyperlipidemia: Secondary | ICD-10-CM

## 2018-12-15 DIAGNOSIS — F411 Generalized anxiety disorder: Secondary | ICD-10-CM

## 2018-12-15 DIAGNOSIS — J309 Allergic rhinitis, unspecified: Secondary | ICD-10-CM

## 2018-12-15 MED ORDER — OLMESARTAN MEDOXOMIL 20 MG PO TABS: 40.0000 mg | ORAL_TABLET | Freq: Every day | ORAL | 3 refills | Status: DC

## 2018-12-15 MED ORDER — ACEBUTOLOL HCL 200 MG PO CAPS: 200.0000 mg | ORAL_CAPSULE | Freq: Every day | ORAL | 3 refills | Status: DC

## 2018-12-15 MED ORDER — LORAZEPAM 0.5 MG PO TABS: 0.5000 mg | ORAL_TABLET | Freq: Two times a day (BID) | ORAL | 2 refills | Status: DC | PRN

## 2018-12-15 MED ORDER — AMLODIPINE BESYLATE 2.5 MG PO TABS: ORAL_TABLET | ORAL | 3 refills | Status: DC

## 2018-12-15 MED ORDER — DESLORATADINE 5 MG PO TABS: 5.0000 mg | ORAL_TABLET | Freq: Every day | ORAL | 3 refills | Status: DC

## 2018-12-15 MED ORDER — ROSUVASTATIN CALCIUM 5 MG PO TABS: 2.5000 mg | ORAL_TABLET | Freq: Every day | ORAL | 3 refills | Status: DC

## 2018-12-15 NOTE — Patient Instructions (Addendum)
   If you have lab work done today you will be contacted with your lab results within the next 2 weeks.  If you have not heard from us then please contact us. The fastest way to get your results is to register for My Chart.   IF you received an x-ray today, you will receive an invoice from [facility]. Please contact [facility] at [phone] with questions or concerns regarding your invoice.   IF you received labwork today, you will receive an invoice from [facility]. Please contact [facility] at [phone] with questions or concerns regarding your invoice.   Our billing staff will not be able to assist you with questions regarding bills from these companies.  You will be contacted with the lab results as soon as they are available. The fastest way to get your results is to activate your My Chart account. Instructions are located on the last page of this paperwork. If you have not heard from us regarding the results in 2 weeks, please contact this office.     Health Maintenance After Age 65 After age 65, you are at a higher risk for certain long-term diseases and infections as well as injuries from falls. Falls are a major cause of broken bones and head injuries in people who are older than age 65. Getting regular preventive care can help to keep you healthy and well. Preventive care includes getting regular testing and making lifestyle changes as recommended by your health care provider. Talk with your health care provider about:  Which screenings and tests you should have. A screening is a test that checks for a disease when you have no symptoms.  A diet and exercise plan that is right for you. What should I know about screenings and tests to prevent falls? Screening and testing are the best ways to find a health problem early. Early diagnosis and treatment give you the best chance of managing medical conditions that are common after age 65. Certain conditions and  lifestyle choices may make you more likely to have a fall. Your health care provider may recommend:  Regular vision checks. Poor vision and conditions such as cataracts can make you more likely to have a fall. If you wear glasses, make sure to get your prescription updated if your vision changes.  Medicine review. Work with your health care provider to regularly review all of the medicines you are taking, including over-the-counter medicines. Ask your health care provider about any side effects that may make you more likely to have a fall. Tell your health care provider if any medicines that you take make you feel dizzy or sleepy.  Osteoporosis screening. Osteoporosis is a condition that causes the bones to get weaker. This can make the bones weak and cause them to break more easily.  Blood pressure screening. Blood pressure changes and medicines to control blood pressure can make you feel dizzy.  Strength and balance checks. Your health care provider may recommend certain tests to check your strength and balance while standing, walking, or changing positions.  Foot health exam. Foot pain and numbness, as well as not wearing proper footwear, can make you more likely to have a fall.  Depression screening. You may be more likely to have a fall if you have a fear of falling, feel emotionally low, or feel unable to do activities that you used to do.  Alcohol use screening. Using too much alcohol can affect your balance and may make you more likely to   have a fall. What actions can I take to lower my risk of falls? General instructions  Talk with your health care provider about your risks for falling. Tell your health care provider if: ? You fall. Be sure to tell your health care provider about all falls, even ones that seem minor. ? You feel dizzy, sleepy, or off-balance.  Take over-the-counter and prescription medicines only as told by your health care provider. These include any  supplements.  Eat a healthy diet and maintain a healthy weight. A healthy diet includes low-fat dairy products, low-fat (lean) meats, and fiber from whole grains, beans, and lots of fruits and vegetables. Home safety  Remove any tripping hazards, such as rugs, cords, and clutter.  Install safety equipment such as grab bars in bathrooms and safety rails on stairs.  Keep rooms and walkways well-lit. Activity   Follow a regular exercise program to stay fit. This will help you maintain your balance. Ask your health care provider what types of exercise are appropriate for you.  If you need a cane or walker, use it as recommended by your health care provider.  Wear supportive shoes that have nonskid soles. Lifestyle  Do not drink alcohol if your health care provider tells you not to drink.  If you drink alcohol, limit how much you have: ? 0-1 drink a day for women. ? 0-2 drinks a day for men.  Be aware of how much alcohol is in your drink. In the [place] one drink equals one typical bottle of beer (12 oz), one-half glass of wine (5 oz), or one shot of hard liquor (1 oz).  Do not use any products that contain nicotine or tobacco, such as cigarettes and e-cigarettes. If you need help quitting, ask your health care provider. Summary  Having a healthy lifestyle and getting preventive care can help to protect your health and wellness after age 65.  Screening and testing are the best way to find a health problem early and help you avoid having a fall. Early diagnosis and treatment give you the best chance for managing medical conditions that are more common for people who are older than age 65.  Falls are a major cause of broken bones and head injuries in people who are older than age 65. Take precautions to prevent a fall at home.  Work with your health care provider to learn what changes you can make to improve your health and wellness and to prevent falls. This information is not intended  to replace advice given to you by your health care provider. Make sure you discuss any questions you have with your health care provider. Document Released: 01/29/2017 Document Revised: 07/09/2018 Document Reviewed: 01/29/2017 Elsevier Patient Education  2020 Elsevier Inc.  

## 2018-12-15 NOTE — Progress Notes (Signed)
Stephanie Aguilar 78 y.o.   Chief Complaint  Patient presents with  . Annual Exam  . Medication Refill    PEND    HISTORY OF PRESENT ILLNESS: This is a 78 y.o. female here for her annual exam.  Has no complaints or medical concerns.  Medical records reviewed last annual exam reported the following:  Cervical CA screening: n/a s/p hysterectomy Breast CA screening: Sees GYN Dr. Ronita Hipps. Last mammogramin July by Dr. Pryor Ochoa family h/o breast CA. Last Dexa 07/08/14; normal. Lowest T score -1.4 in L femur. Does takes HRT. Supplements:Takes 2000 mgVit Ddaily.Lactose intolerant.Pt eats yogurt, 1-2 cups of lactate milk and lactate ice cream daily. Cardiac: EKG 09/04/16. Carotid US: less than 50% bilateral 04/21/15. Seen several months prior by Dr. Einar Gip.States she had her carotid arteries rechecked which looked good. Walks 1.5 miles for exercise several times/week and gardening. Diet excellent with low salt and lots of fresh foods. Dentist:seen every 6 months; last visit was last week.Dental implant ~4 years ago that became infected and was removed. Plans to have a dental bridge placed.Optho: seenregularly byDr. Katy Fitch.  Chronic Medical Conditions Vit D Deficiency and Vit D 20 8 months prior. HTN: monitors BP outside office. 110-120/65-80.Diovanchanged toolmesartanwith excellent result.  Recent diarrhea and abdominal cramping with epigastric pain. Lactase deficient.  HPI   Prior to Admission medications   Medication Sig Start Date End Date Taking? Authorizing Provider  acebutolol (SECTRAL) 200 MG capsule TAKE (1) CAPSULE DAILY. 06/02/18  Yes Horald Pollen, MD  amLODipine (NORVASC) 2.5 MG tablet TAKE 1 TABLET EACH DAY. 06/02/18  Yes Ronne Savoia, Ines Bloomer, MD  aspirin 81 MG tablet Take 81 mg by mouth daily.   Yes [provider]  Cholecalciferol (VITAMIN D) 2000 units CAPS Take 1 capsule by mouth daily.   Yes [provider]  clobetasol  (TEMOVATE) 0.05 % external solution Apply 10 drops to scalp every night at bedtime. 04/11/15  Yes Darlyne Russian, MD  cycloSPORINE (RESTASIS) 0.05 % ophthalmic emulsion 1 drop 2 (two) times daily.   Yes [provider]  desloratadine (CLARINEX) 5 MG tablet TAKE 1 TABLET DAILY AS DIRECTED. 11/19/18  Yes Jagjit Riner, Ines Bloomer, MD  fluticasone [facility]) 50 MCG/ACT nasal spray USE 2 SPRAYS INTO EACH NOSTRIL DAILY 09/20/14  Yes Daub, Loura Back, MD  LORazepam (ATIVAN) 0.5 MG tablet Take 1 tablet (0.5 mg total) by mouth daily as needed for anxiety. 09/07/18  Yes Evangelyne Loja, Ines Bloomer, MD  METRONIDAZOLE, TOPICAL, 0.75 % LOTN APPLY TOPICALLY TO AFFECTED AREA. 09/21/15  Yes Daub, Loura Back, MD  olmesartan (BENICAR) 20 MG tablet Take 2 tablets (40 mg total) by mouth daily. 06/09/17  Yes Shawnee Knapp, MD  OVER THE COUNTER MEDICATION OTC Imodium taking daily for diarrrhea   Yes [provider]  rosuvastatin (CRESTOR) 5 MG tablet TAKE 1/2 TABLET EVERY DAY. 11/19/18  Yes Horald Pollen, MD  VIVELLE-DOT 0.0375 MG/24HR  09/05/14  Yes [provider]  olmesartan (BENICAR) 40 MG tablet Take 1 tablet (40 mg total) by mouth daily. Patient not taking: Reported on 12/15/2018 12/11/17   Horald Pollen, MD    Allergies  Allergen Reactions  . Codeine   . Erythromycin     Sick on stomach  . Latex   . Lisinopril   . Sulfa Antibiotics     rash    Patient Active Problem List   Diagnosis Date Noted  . CKD (chronic kidney disease), stage III ([place]) 04/13/2016  . PAD (peripheral  artery disease) ([place]) 04/12/2016  . Anxiety disorder 11/07/2015  . Eczema 11/07/2015  . Carotid bruit 07/06/2013  . Essential hypertension 02/04/2012  . Hyperlipidemia 02/04/2012  . Menopausal syndrome on hormone replacement therapy 02/04/2012    Past Medical History:  Diagnosis Date  . Allergy   . Anxiety   . Cataract   . Depression   . Hyperlipidemia   . Hypertension     Past Surgical History:   Procedure Laterality Date  . ABDOMINAL HYSTERECTOMY    . APPENDECTOMY    . BREAST SURGERY    . DENTAL SURGERY    . TONSILLECTOMY AND ADENOIDECTOMY      Social History   Socioeconomic History  . Marital status: Married    Spouse name: Not on file  . Number of children: Not on file  . Years of education: Not on file  . Highest education level: Not on file  Occupational History  . Occupation: Animal nutritionist  Social Needs  . Financial resource strain: Not on file  . Food insecurity    Worry: Not on file    Inability: Not on file  . Transportation needs    Medical: Not on file    Non-medical: Not on file  Tobacco Use  . Smoking status: Never Smoker  . Smokeless tobacco: Never Used  Substance and Sexual Activity  . Alcohol use: Yes    Comment: 1-2 oz weekly  . Drug use: No  . Sexual activity: Never  Lifestyle  . Physical activity    Days per week: Not on file    Minutes per session: Not on file  . Stress: Not on file  Relationships  . Social Herbalist on phone: Not on file    Gets together: Not on file    Attends religious service: Not on file    Active member of club or organization: Not on file    Attends meetings of clubs or organizations: Not on file    Relationship status: Not on file  . Intimate partner violence    Fear of current or ex partner: Not on file    Emotionally abused: Not on file    Physically abused: Not on file    Forced sexual activity: Not on file  Other Topics Concern  . Not on file  Social History Narrative  . Not on file    Family History  Problem Relation Age of Onset  . Hypertension Mother   . Alcohol abuse Father   . COPD Brother   . Hypertension Brother   . Alcohol abuse Brother   . Heart disease Maternal Grandmother   . Cancer Maternal Grandfather   . Stroke Paternal Grandmother   . Gout Brother   . Alcohol abuse Brother      Review of Systems  Constitutional: Negative.  Negative for chills and fever.   HENT: Negative.  Negative for congestion and sore throat.   Eyes: Negative.   Respiratory: Negative.  Negative for cough and shortness of breath.   Cardiovascular: Negative.  Negative for chest pain and palpitations.  Gastrointestinal: Negative.  Negative for abdominal pain, blood in stool, diarrhea, melena, nausea and vomiting.  Genitourinary: Negative.  Negative for dysuria, frequency and hematuria.  Musculoskeletal: Negative.  Negative for myalgias and neck pain.  Skin: Negative.  Negative for rash.  Neurological: Negative.  Negative for dizziness and headaches.  All other systems reviewed and are negative.  Today's Vitals   12/15/18 0822  BP: 140/67  Pulse: Marland Kitchen)  56  Resp: 16  Temp: 98.2 F (36.8 C)  TempSrc: Oral  SpO2: 99%  Weight: 106 lb 6.4 oz (48.3 kg)  Height: 4' 11.5" (1.511 m)   Body mass index is 21.13 kg/m.   Physical Exam Vitals signs reviewed.  Constitutional:      Appearance: Normal appearance.  HENT:     Head: Normocephalic.  Eyes:     Extraocular Movements: Extraocular movements intact.     Conjunctiva/sclera: Conjunctivae normal.     Pupils: Pupils are equal, round, and reactive to light.  Neck:     Musculoskeletal: Normal range of motion and neck supple.  Cardiovascular:     Rate and Rhythm: Normal rate and regular rhythm.     Pulses: Normal pulses.     Heart sounds: Normal heart sounds.  Pulmonary:     Effort: Pulmonary effort is normal.     Breath sounds: Normal breath sounds.  Abdominal:     General: There is no distension.     Palpations: Abdomen is soft.     Tenderness: There is no abdominal tenderness.  Musculoskeletal: Normal range of motion.  Skin:    General: Skin is warm and dry.     Capillary Refill: Capillary refill takes less than 2 seconds.  Neurological:     General: No focal deficit present.     Mental Status: She is alert and oriented to person, place, and time.  Psychiatric:        Mood and Affect: Mood normal.         Behavior: Behavior normal.      ASSESSMENT & PLAN: Stephanie Aguilar was seen today for annual exam and medication refill.  Diagnoses and all orders for this visit:  Encounter for Medicare annual wellness exam  Essential hypertension, benign -     amLODipine (NORVASC) 2.5 MG tablet; TAKE 1 TABLET EACH DAY. -     CBC with Differential/Platelet -     Comprehensive metabolic panel  Allergic rhinitis -     desloratadine (CLARINEX) 5 MG tablet; Take 1 tablet (5 mg total) by mouth daily. as directed  Mixed hyperlipidemia -     rosuvastatin (CRESTOR) 5 MG tablet; Take 0.5 tablets (2.5 mg total) by mouth daily. -     Lipid panel  Generalized anxiety disorder -     LORazepam (ATIVAN) 0.5 MG tablet; Take 1 tablet (0.5 mg total) by mouth 2 (two) times daily as needed for anxiety.  Need for prophylactic vaccination and inoculation against influenza -     Flu Vaccine QUAD High Dose(Fluad)  Other orders -     acebutolol (SECTRAL) 200 MG capsule; Take 1 capsule (200 mg total) by mouth daily. -     olmesartan (BENICAR) 20 MG tablet; Take 2 tablets (40 mg total) by mouth daily.     Patient Instructions       If you have lab work done today you will be contacted with your lab results within the next 2 weeks.  If you have not heard from [place] then please contact us. The fastest way to get your results is to register for My Chart.   IF you received an x-ray today, you will receive an invoice from [facility]. Please contact [facility] at [phone] with questions or concerns regarding your invoice.   IF you received labwork today, you will receive an invoice from [facility]. Please contact [facility] at [phone] with questions or concerns regarding your invoice.   Our billing staff will not be  able to assist you with questions regarding bills from these companies.  You will be contacted with the lab results as soon as they are available. The fastest way to get your results is  to activate your My Chart account. Instructions are located on the last page of this paperwork. If you have not heard from [place] regarding the results in 2 weeks, please contact this office.     Health Maintenance After Age 58 After age 6, you are at a higher risk for certain long-term diseases and infections as well as injuries from falls. Falls are a major cause of broken bones and head injuries in people who are older than age 7. Getting regular preventive care can help to keep you healthy and well. Preventive care includes getting regular testing and making lifestyle changes as recommended by your health care provider. Talk with your health care provider about:  Which screenings and tests you should have. A screening is a test that checks for a disease when you have no symptoms.  A diet and exercise plan that is right for you. What should I know about screenings and tests to prevent falls? Screening and testing are the best ways to find a health problem early. Early diagnosis and treatment give you the best chance of managing medical conditions that are common after age 51. Certain conditions and lifestyle choices may make you more likely to have a fall. Your health care provider may recommend:  Regular vision checks. Poor vision and conditions such as cataracts can make you more likely to have a fall. If you wear glasses, make sure to get your prescription updated if your vision changes.  Medicine review. Work with your health care provider to regularly review all of the medicines you are taking, including over-the-counter medicines. Ask your health care provider about any side effects that may make you more likely to have a fall. Tell your health care provider if any medicines that you take make you feel dizzy or sleepy.  Osteoporosis screening. Osteoporosis is a condition that causes the bones to get weaker. This can make the bones weak and cause them to break more easily.  Blood pressure  screening. Blood pressure changes and medicines to control blood pressure can make you feel dizzy.  Strength and balance checks. Your health care provider may recommend certain tests to check your strength and balance while standing, walking, or changing positions.  Foot health exam. Foot pain and numbness, as well as not wearing proper footwear, can make you more likely to have a fall.  Depression screening. You may be more likely to have a fall if you have a fear of falling, feel emotionally low, or feel unable to do activities that you used to do.  Alcohol use screening. Using too much alcohol can affect your balance and may make you more likely to have a fall. What actions can I take to lower my risk of falls? General instructions  Talk with your health care provider about your risks for falling. Tell your health care provider if: ? You fall. Be sure to tell your health care provider about all falls, even ones that seem minor. ? You feel dizzy, sleepy, or off-balance.  Take over-the-counter and prescription medicines only as told by your health care provider. These include any supplements.  Eat a healthy diet and maintain a healthy weight. A healthy diet includes low-fat dairy products, low-fat (lean) meats, and fiber from whole grains, beans, and lots of fruits and vegetables.  Home safety  Remove any tripping hazards, such as rugs, cords, and clutter.  Install safety equipment such as grab bars in bathrooms and safety rails on stairs.  Keep rooms and walkways well-lit. Activity   Follow a regular exercise program to stay fit. This will help you maintain your balance. Ask your health care provider what types of exercise are appropriate for you.  If you need a cane or walker, use it as recommended by your health care provider.  Wear supportive shoes that have nonskid soles. Lifestyle  Do not drink alcohol if your health care provider tells you not to drink.  If you drink  alcohol, limit how much you have: ? 0-1 drink a day for women. ? 0-2 drinks a day for men.  Be aware of how much alcohol is in your drink. In the [place] one drink equals one typical bottle of beer (12 oz), one-half glass of wine (5 oz), or one shot of hard liquor (1 oz).  Do not use any products that contain nicotine or tobacco, such as cigarettes and e-cigarettes. If you need help quitting, ask your health care provider. Summary  Having a healthy lifestyle and getting preventive care can help to protect your health and wellness after age 103.  Screening and testing are the best way to find a health problem early and help you avoid having a fall. Early diagnosis and treatment give you the best chance for managing medical conditions that are more common for people who are older than age 63.  Falls are a major cause of broken bones and head injuries in people who are older than age 103. Take precautions to prevent a fall at home.  Work with your health care provider to learn what changes you can make to improve your health and wellness and to prevent falls. This information is not intended to replace advice given to you by your health care provider. Make sure you discuss any questions you have with your health care provider. Document Released: 01/29/2017 Document Revised: 07/09/2018 Document Reviewed: 01/29/2017 Elsevier Patient Education  2020 Elsevier Inc.      Agustina Caroli, MD [facility]

## 2018-12-16 LAB — COMPREHENSIVE METABOLIC PANEL
ALT: 19 IU/L (ref 0–32)
AST: 20 IU/L (ref 0–40)
Albumin/Globulin Ratio: 1.8 (ref 1.2–2.2)
Albumin: 4.4 g/dL (ref 3.7–4.7)
Alkaline Phosphatase: 80 IU/L (ref 39–117)
BUN/Creatinine Ratio: 21 (ref 12–28)
BUN: 21 mg/dL (ref 8–27)
Bilirubin Total: 0.5 mg/dL (ref 0.0–1.2)
CO2: 23 mmol/L (ref 20–29)
Calcium: 9.5 mg/dL (ref 8.7–10.3)
Chloride: 103 mmol/L (ref 96–106)
Creatinine, Ser: 1.01 mg/dL — ABNORMAL HIGH (ref 0.57–1.00)
GFR calc Af Amer: 62 mL/min/{1.73_m2} (ref >59)
GFR calc non Af Amer: 54 mL/min/{1.73_m2} — ABNORMAL LOW (ref >59)
Globulin, Total: 2.4 g/dL (ref 1.5–4.5)
Glucose: 92 mg/dL (ref 65–99)
Potassium: 4.3 mmol/L (ref 3.5–5.2)
Sodium: 140 mmol/L (ref 134–144)
Total Protein: 6.8 g/dL (ref 6.0–8.5)

## 2018-12-16 LAB — CBC WITH DIFFERENTIAL/PLATELET
Basophils Absolute: 0.1 10*3/uL (ref 0.0–0.2)
Basos: 1 %
EOS (ABSOLUTE): 0.3 10*3/uL (ref 0.0–0.4)
Eos: 4 %
Hematocrit: 44.1 % (ref 34.0–46.6)
Hemoglobin: 14.3 g/dL (ref 11.1–15.9)
Immature Grans (Abs): 0 10*3/uL (ref 0.0–0.1)
Immature Granulocytes: 0 %
Lymphocytes Absolute: 1.4 10*3/uL (ref 0.7–3.1)
Lymphs: 19 %
MCH: 29.9 pg (ref 26.6–33.0)
MCHC: 32.4 g/dL (ref 31.5–35.7)
MCV: 92 fL (ref 79–97)
Monocytes Absolute: 0.6 10*3/uL (ref 0.1–0.9)
Monocytes: 8 %
Neutrophils Absolute: 5.1 10*3/uL (ref 1.4–7.0)
Neutrophils: 68 %
Platelets: 204 10*3/uL (ref 150–450)
RBC: 4.79 x10E6/uL (ref 3.77–5.28)
RDW: 12.7 % (ref 11.7–15.4)
WBC: 7.5 10*3/uL (ref 3.4–10.8)

## 2018-12-16 LAB — LIPID PANEL
Chol/HDL Ratio: 2.3 ratio (ref 0.0–4.4)
Cholesterol, Total: 141 mg/dL (ref 100–199)
HDL: 61 mg/dL (ref >39)
LDL Chol Calc (NIH): 52 mg/dL (ref 0–99)
Triglycerides: 171 mg/dL — ABNORMAL HIGH (ref 0–149)
VLDL Cholesterol Cal: 28 mg/dL (ref 5–40)

## 2018-12-18 ENCOUNTER — Telehealth: Payer: Self-pay | Admitting: Emergency Medicine

## 2018-12-18 NOTE — Telephone Encounter (Signed)
Copied from [place] [phone] [phone]. Topic: General - Other >> Dec 18, 2018  2:50 PM Carolyn Stare wrote:  Pt return call for lab results Aaron Edelman called in

## 2018-12-24 NOTE — Telephone Encounter (Signed)
Spoke to patient with lab results and patient requested a copy.

## 2019-04-20 ENCOUNTER — Ambulatory Visit: Payer: Medicare Other | Attending: Internal Medicine

## 2019-04-20 DIAGNOSIS — Z23 Encounter for immunization: Secondary | ICD-10-CM

## 2019-04-20 NOTE — Progress Notes (Signed)
   Covid-19 Vaccination Clinic  Name:  Stephanie Aguilar    MRN: [id number] DOB: [birth year]  04/20/2019  [name] was observed post Covid-19 immunization for 15 minutes without incidence. She was provided with Vaccine Information Sheet and instruction to access the V-Safe system.   [name] was instructed to call 911 with any severe reactions post vaccine: Marland Kitchen Difficulty breathing  . Swelling of your face and throat  . A fast heartbeat  . A bad rash all over your body  . Dizziness and weakness    Immunizations Administered    Name Date Dose VIS Date Route   Pfizer COVID-19 Vaccine 04/20/2019 11:40 AM 0.3 mL 03/12/2019 Intramuscular   Manufacturer: [employer]   Lot: [id number]   [place]: [id number]

## 2019-05-11 ENCOUNTER — Ambulatory Visit: Payer: Medicare Other | Attending: Internal Medicine

## 2019-05-11 DIAGNOSIS — Z23 Encounter for immunization: Secondary | ICD-10-CM | POA: Insufficient documentation

## 2019-05-11 NOTE — Progress Notes (Signed)
   Covid-19 Vaccination Clinic  Name:  Stephanie Aguilar    MRN: [id number] DOB: [birth year]  05/11/2019  [name] was observed post Covid-19 immunization for 15 minutes without incidence. She was provided with Vaccine Information Sheet and instruction to access the V-Safe system.   Ms. Gene was instructed to call 911 with any severe reactions post vaccine: Marland Kitchen Difficulty breathing  . Swelling of your face and throat  . A fast heartbeat  . A bad rash all over your body  . Dizziness and weakness    Immunizations Administered    Name Date Dose VIS Date Route   Pfizer COVID-19 Vaccine 05/11/2019  1:31 PM 0.3 mL 03/12/2019 Intramuscular   Manufacturer: [place]   Lot: [id number]   [place]: [id number]

## 2019-08-24 ENCOUNTER — Telehealth: Payer: Self-pay | Admitting: *Deleted

## 2019-08-24 NOTE — Telephone Encounter (Signed)
Faxed Rx request for Lorazepam 0.5 mg to [facility]. Confirmation page 5:17 pm.

## 2019-11-16 ENCOUNTER — Other Ambulatory Visit: Payer: Self-pay | Admitting: Emergency Medicine

## 2019-11-16 DIAGNOSIS — F411 Generalized anxiety disorder: Secondary | ICD-10-CM

## 2019-11-16 MED ORDER — LORAZEPAM 0.5 MG PO TABS: 0.5000 mg | ORAL_TABLET | Freq: Two times a day (BID) | ORAL | 2 refills | Status: DC | PRN

## 2019-11-17 LAB — HM MAMMOGRAPHY

## 2019-11-22 ENCOUNTER — Encounter: Payer: Self-pay | Admitting: *Deleted

## 2019-12-20 ENCOUNTER — Other Ambulatory Visit: Payer: Self-pay

## 2019-12-20 ENCOUNTER — Encounter: Payer: Self-pay | Admitting: Family Medicine

## 2019-12-20 ENCOUNTER — Ambulatory Visit (INDEPENDENT_AMBULATORY_CARE_PROVIDER_SITE_OTHER): Payer: Medicare Other | Admitting: Emergency Medicine

## 2019-12-20 ENCOUNTER — Encounter: Payer: Self-pay | Admitting: Emergency Medicine

## 2019-12-20 VITALS — BP 150/60 | HR 60 | Temp 99.0°F | Ht 59.0 in | Wt 104.6 lb

## 2019-12-20 DIAGNOSIS — Z23 Encounter for immunization: Secondary | ICD-10-CM | POA: Diagnosis not present

## 2019-12-20 DIAGNOSIS — I739 Peripheral vascular disease, unspecified: Secondary | ICD-10-CM | POA: Diagnosis not present

## 2019-12-20 DIAGNOSIS — E782 Mixed hyperlipidemia: Secondary | ICD-10-CM

## 2019-12-20 DIAGNOSIS — J309 Allergic rhinitis, unspecified: Secondary | ICD-10-CM | POA: Diagnosis not present

## 2019-12-20 DIAGNOSIS — I10 Essential (primary) hypertension: Secondary | ICD-10-CM

## 2019-12-20 DIAGNOSIS — Z0000 Encounter for general adult medical examination without abnormal findings: Secondary | ICD-10-CM

## 2019-12-20 MED ORDER — ROSUVASTATIN CALCIUM 5 MG PO TABS: 2.5000 mg | ORAL_TABLET | Freq: Every day | ORAL | 3 refills | Status: DC

## 2019-12-20 MED ORDER — AMLODIPINE BESYLATE 2.5 MG PO TABS: ORAL_TABLET | ORAL | 3 refills | Status: DC

## 2019-12-20 MED ORDER — ACEBUTOLOL HCL 200 MG PO CAPS: 200.0000 mg | ORAL_CAPSULE | Freq: Every day | ORAL | 3 refills | Status: DC

## 2019-12-20 MED ORDER — DESLORATADINE 5 MG PO TABS: 5.0000 mg | ORAL_TABLET | Freq: Every day | ORAL | 3 refills | Status: DC

## 2019-12-20 MED ORDER — OLMESARTAN MEDOXOMIL 40 MG PO TABS: 40.0000 mg | ORAL_TABLET | Freq: Every day | ORAL | 3 refills | Status: DC

## 2019-12-20 NOTE — Patient Instructions (Addendum)
   If you have lab work done today you will be contacted with your lab results within the next 2 weeks.  If you have not heard from us then please contact us. The fastest way to get your results is to register for My Chart.   IF you received an x-ray today, you will receive an invoice from [facility]. Please contact [facility] at [phone] with questions or concerns regarding your invoice.   IF you received labwork today, you will receive an invoice from [facility]. Please contact [facility] at [phone] with questions or concerns regarding your invoice.   Our billing staff will not be able to assist you with questions regarding bills from these companies.  You will be contacted with the lab results as soon as they are available. The fastest way to get your results is to activate your My Chart account. Instructions are located on the last page of this paperwork. If you have not heard from us regarding the results in 2 weeks, please contact this office.       Health Maintenance, Female Adopting a healthy lifestyle and getting preventive care are important in promoting health and wellness. Ask your health care provider about:  The right schedule for you to have regular tests and exams.  Things you can do on your own to prevent diseases and keep yourself healthy. What should I know about diet, weight, and exercise? Eat a healthy diet   Eat a diet that includes plenty of vegetables, fruits, low-fat dairy products, and lean protein.  Do not eat a lot of foods that are high in solid fats, added sugars, or sodium. Maintain a healthy weight Body mass index (BMI) is used to identify weight problems. It estimates body fat based on height and weight. Your health care provider can help determine your BMI and help you achieve or maintain a healthy weight. Get regular exercise Get regular exercise. This is one of the most important things you can do for your health. Most  adults should:  Exercise for at least 150 minutes each week. The exercise should increase your heart rate and make you sweat (moderate-intensity exercise).  Do strengthening exercises at least twice a week. This is in addition to the moderate-intensity exercise.  Spend less time sitting. Even light physical activity can be beneficial. Watch cholesterol and blood lipids Have your blood tested for lipids and cholesterol at 78 years of age, then have this test every 5 years. Have your cholesterol levels checked more often if:  Your lipid or cholesterol levels are high.  You are older than 78 years of age.  You are at high risk for heart disease. What should I know about cancer screening? Depending on your health history and family history, you may need to have cancer screening at various ages. This may include screening for:  Breast cancer.  Cervical cancer.  Colorectal cancer.  Skin cancer.  Lung cancer. What should I know about heart disease, diabetes, and high blood pressure? Blood pressure and heart disease  High blood pressure causes heart disease and increases the risk of stroke. This is more likely to develop in people who have high blood pressure readings, are of African descent, or are overweight.  Have your blood pressure checked: ? Every 3-5 years if you are 18-39 years of age. ? Every year if you are 40 years old or older. Diabetes Have regular diabetes screenings. This checks your fasting blood sugar level. Have the screening done:  Once   every three years after age 40 if you are at a normal weight and have a low risk for diabetes.  More often and at a younger age if you are overweight or have a high risk for diabetes. What should I know about preventing infection? Hepatitis B If you have a higher risk for hepatitis B, you should be screened for this virus. Talk with your health care provider to find out if you are at risk for hepatitis B infection. Hepatitis  C Testing is recommended for:  Everyone born from 1945 through 1965.  Anyone with known risk factors for hepatitis C. Sexually transmitted infections (STIs)  Get screened for STIs, including gonorrhea and chlamydia, if: ? You are sexually active and are younger than 78 years of age. ? You are older than 78 years of age and your health care provider tells you that you are at risk for this type of infection. ? Your sexual activity has changed since you were last screened, and you are at increased risk for chlamydia or gonorrhea. Ask your health care provider if you are at risk.  Ask your health care provider about whether you are at high risk for HIV. Your health care provider may recommend a prescription medicine to help prevent HIV infection. If you choose to take medicine to prevent HIV, you should first get tested for HIV. You should then be tested every 3 months for as long as you are taking the medicine. Pregnancy  If you are about to stop having your period (premenopausal) and you may become pregnant, seek counseling before you get pregnant.  Take 400 to 800 micrograms (mcg) of folic acid every day if you become pregnant.  Ask for birth control (contraception) if you want to prevent pregnancy. Osteoporosis and menopause Osteoporosis is a disease in which the bones lose minerals and strength with aging. This can result in bone fractures. If you are 65 years old or older, or if you are at risk for osteoporosis and fractures, ask your health care provider if you should:  Be screened for bone loss.  Take a calcium or vitamin D supplement to lower your risk of fractures.  Be given hormone replacement therapy (HRT) to treat symptoms of menopause. Follow these instructions at home: Lifestyle  Do not use any products that contain nicotine or tobacco, such as cigarettes, e-cigarettes, and chewing tobacco. If you need help quitting, ask your health care provider.  Do not use street  drugs.  Do not share needles.  Ask your health care provider for help if you need support or information about quitting drugs. Alcohol use  Do not drink alcohol if: ? Your health care provider tells you not to drink. ? You are pregnant, may be pregnant, or are planning to become pregnant.  If you drink alcohol: ? Limit how much you use to 0-1 drink a day. ? Limit intake if you are breastfeeding.  Be aware of how much alcohol is in your drink. In the [place] one drink equals one 12 oz bottle of beer (355 mL), one 5 oz glass of wine (148 mL), or one 1 oz glass of hard liquor (44 mL). General instructions  Schedule regular health, dental, and eye exams.  Stay current with your vaccines.  Tell your health care provider if: ? You often feel depressed. ? You have ever been abused or do not feel safe at home. Summary  Adopting a healthy lifestyle and getting preventive care are important in promoting health and   wellness.  Follow your health care provider's instructions about healthy diet, exercising, and getting tested or screened for diseases.  Follow your health care provider's instructions on monitoring your cholesterol and blood pressure. This information is not intended to replace advice given to you by your health care provider. Make sure you discuss any questions you have with your health care provider. Document Revised: 03/11/2018 Document Reviewed: 03/11/2018 Elsevier Patient Education  2020 Elsevier Inc.  

## 2019-12-20 NOTE — Progress Notes (Addendum)
Stephanie Aguilar 79 y.o.   Chief Complaint  Patient presents with  . Annual Exam    CPE--should she get booster for Covid Vaccine     HISTORY OF PRESENT ILLNESS: This is a 79 y.o. female here for her annual exam. Doing well.  Has no complaints or medical concerns. Has history of hypertension on amlodipine, olmesartan and acebutolol. History of dyslipidemia on Crestor 5 mg daily. Takes 1 baby aspirin daily. Has history of peripheral artery disease, doing well. Has history of chronic kidney disease 3a.  Doing well. Fully vaccinated against Covid.  HPI   Prior to Admission medications   Medication Sig Start Date End Date Taking? Authorizing Provider  acebutolol (SECTRAL) 200 MG capsule Take 1 capsule (200 mg total) by mouth daily. 12/15/18 12/20/19 Yes Stephanie Aguilar  amLODipine (NORVASC) 2.5 MG tablet TAKE 1 TABLET EACH DAY. 12/15/18  Yes Stephanie Aguilar  aspirin 81 MG tablet Take 81 mg by mouth daily.   Yes Provider, Historical, Aguilar  Cholecalciferol (VITAMIN D) 2000 units CAPS Take 1 capsule by mouth daily.   Yes Provider, Historical, Aguilar  clobetasol (TEMOVATE) 0.05 % external solution Apply 10 drops to scalp every night at bedtime. 04/11/15  Yes Stephanie Aguilar  cycloSPORINE (RESTASIS) 0.05 % ophthalmic emulsion 1 drop 2 (two) times daily.   Yes Provider, Historical, Aguilar  desloratadine (CLARINEX) 5 MG tablet Take 1 tablet (5 mg total) by mouth daily. as directed 12/15/18  Yes Stephanie Aguilar  fluticasone [facility]) 50 MCG/ACT nasal spray USE 2 SPRAYS INTO EACH NOSTRIL DAILY 09/20/14  Yes Stephanie Aguilar  LORazepam (ATIVAN) 0.5 MG tablet Take 1 tablet (0.5 mg total) by mouth 2 (two) times daily as needed for anxiety. 11/16/19  Yes Stephanie Aguilar  METRONIDAZOLE, TOPICAL, 0.75 % LOTN APPLY TOPICALLY TO AFFECTED AREA. 09/21/15  Yes Stephanie Aguilar  olmesartan (BENICAR) 40 MG tablet Take 1 tablet (40 mg total) by mouth daily. 12/11/17  Yes  Stephanie Aguilar  OVER THE COUNTER MEDICATION OTC Imodium taking daily for diarrrhea   Yes Provider, Historical, Aguilar  rosuvastatin (CRESTOR) 5 MG tablet Take 0.5 tablets (2.5 mg total) by mouth daily. 12/15/18 12/20/19 Yes Stephanie Aguilar  VIVELLE-DOT 0.0375 MG/24HR  09/05/14  Yes Provider, Historical, Aguilar    Allergies  Allergen Reactions  . Codeine   . Erythromycin     Sick on stomach  . Latex   . Lisinopril   . Sulfa Antibiotics     rash    Patient Active Problem List   Diagnosis Date Noted  . CKD (chronic kidney disease), stage III 04/13/2016  . PAD (peripheral artery disease) ([place]) 04/12/2016  . Anxiety disorder 11/07/2015  . Carotid bruit 07/06/2013  . Essential hypertension 02/04/2012  . Hyperlipidemia 02/04/2012  . Menopausal syndrome on hormone replacement therapy 02/04/2012    Past Medical History:  Diagnosis Date  . Allergy   . Anxiety   . Cataract   . Depression   . Hyperlipidemia   . Hypertension     Past Surgical History:  Procedure Laterality Date  . ABDOMINAL HYSTERECTOMY    . APPENDECTOMY    . BREAST SURGERY    . DENTAL SURGERY    . TONSILLECTOMY AND ADENOIDECTOMY      Social History   Socioeconomic History  . Marital status: Married    Spouse name: Not on file  . Number of children: Not on file  .  Years of education: Not on file  . Highest education level: Not on file  Occupational History  . Occupation: retired/homemaker  Tobacco Use  . Smoking status: Never Smoker  . Smokeless tobacco: Never Used  Substance and Sexual Activity  . Alcohol use: Yes    Comment: 1-2 oz weekly  . Drug use: No  . Sexual activity: Never  Other Topics Concern  . Not on file  Social History Narrative  . Not on file   Social Determinants of Health   Financial Resource Strain:   . Difficulty of Paying Living Expenses: Not on file  Food Insecurity:   . Worried About Charity fundraiser in the Last Year: Not on file  . Ran Out of Food in  the Last Year: Not on file  Transportation Needs:   . Lack of Transportation (Medical): Not on file  . Lack of Transportation (Non-Medical): Not on file  Physical Activity:   . Days of Exercise per Week: Not on file  . Minutes of Exercise per Session: Not on file  Stress:   . Feeling of Stress : Not on file  Social Connections:   . Frequency of Communication with Friends and Family: Not on file  . Frequency of Social Gatherings with Friends and Family: Not on file  . Attends Religious Services: Not on file  . Active Member of Clubs or Organizations: Not on file  . Attends Archivist Meetings: Not on file  . Marital Status: Not on file  Intimate Partner Violence:   . Fear of Current or Ex-Partner: Not on file  . Emotionally Abused: Not on file  . Physically Abused: Not on file  . Sexually Abused: Not on file    Family History  Problem Relation Age of Onset  . Hypertension Mother   . Alcohol abuse Father   . COPD Brother   . Hypertension Brother   . Alcohol abuse Brother   . Heart disease Maternal Grandmother   . Cancer Maternal Grandfather   . Stroke Paternal Grandmother   . Gout Brother   . Alcohol abuse Brother      Review of Systems  Constitutional: Negative.  Negative for chills and fever.  HENT: Negative.  Negative for congestion and sore throat.   Respiratory: Negative.  Negative for cough and shortness of breath.   Cardiovascular: Negative.  Negative for chest pain and palpitations.  Gastrointestinal: Negative.  Negative for abdominal pain, blood in stool, diarrhea, melena, nausea and vomiting.  Genitourinary: Negative.  Negative for dysuria and hematuria.  Musculoskeletal: Negative.   Skin: Negative.  Negative for rash.  Neurological: Negative.  Negative for dizziness and headaches.  All other systems reviewed and are negative.  Today's Vitals   12/20/19 0843 12/20/19 0904  BP: (!) 148/62 (!) 150/60  Pulse: 60   Temp: 99 F (37.2 C)   TempSrc:  Temporal   SpO2: 99%   Weight: 104 lb 9.6 oz (47.4 kg)   Height: _0  (1.499 m)    Body mass index is 21.13 kg/m.   Physical Exam Vitals reviewed.  Constitutional:      Appearance: Normal appearance.  HENT:     Head: Normocephalic.  Eyes:     Extraocular Movements: Extraocular movements intact.     Conjunctiva/sclera: Conjunctivae normal.     Pupils: Pupils are equal, round, and reactive to light.  Neck:     Vascular: No carotid bruit.  Cardiovascular:     Rate and Rhythm: Normal rate  and regular rhythm.     Pulses: Normal pulses.     Heart sounds: Normal heart sounds.  Pulmonary:     Effort: Pulmonary effort is normal.     Breath sounds: Normal breath sounds.  Abdominal:     General: There is no distension.     Palpations: Abdomen is soft. There is no mass.     Tenderness: There is no abdominal tenderness.  Musculoskeletal:        General: Normal range of motion.     Cervical back: Normal range of motion and neck supple. No tenderness.     Right lower leg: No edema.     Left lower leg: No edema.  Lymphadenopathy:     Cervical: No cervical adenopathy.  Skin:    General: Skin is warm and dry.     Capillary Refill: Capillary refill takes less than 2 seconds.  Neurological:     General: No focal deficit present.     Mental Status: She is alert and oriented to person, place, and time.  Psychiatric:        Mood and Affect: Mood normal.        Behavior: Behavior normal.      ASSESSMENT & PLAN: Shavonte was seen today for annual exam.  Diagnoses and all orders for this visit:  Essential hypertension, benign -     amLODipine (NORVASC) 2.5 MG tablet; TAKE 1 TABLET EACH DAY. -     olmesartan (BENICAR) 40 MG tablet; Take 1 tablet (40 mg total) by mouth daily. -     CMP14+EGFR  Routine general medical examination at a health care facility  Allergic rhinitis, unspecified seasonality, unspecified trigger -     desloratadine (CLARINEX) 5 MG tablet; Take 1 tablet (5  mg total) by mouth daily. as directed  PAD (peripheral artery disease) (HCC)  Mixed hyperlipidemia -     rosuvastatin (CRESTOR) 5 MG tablet; Take 0.5 tablets (2.5 mg total) by mouth daily. -     Lipid panel  Need for influenza vaccination -     Flu Vaccine QUAD High Dose(Fluad)  Other orders -     acebutolol (SECTRAL) 200 MG capsule; Take 1 capsule (200 mg total) by mouth daily. -     Tdap vaccine greater than or equal to 7yo IM     Patient Instructions       If you have lab work done today you will be contacted with your lab results within the next 2 weeks.  If you have not heard from [place] then please contact us. The fastest way to get your results is to register for My Chart.   IF you received an x-ray today, you will receive an invoice from [facility]. Please contact [facility] at [phone] with questions or concerns regarding your invoice.   IF you received labwork today, you will receive an invoice from [facility]. Please contact [facility] at [phone] with questions or concerns regarding your invoice.   Our billing staff will not be able to assist you with questions regarding bills from these companies.  You will be contacted with the lab results as soon as they are available. The fastest way to get your results is to activate your My Chart account. Instructions are located on the last page of this paperwork. If you have not heard from [place] regarding the results in 2 weeks, please contact this office.       Health Maintenance, Female Adopting a healthy lifestyle and getting preventive care  are important in promoting health and wellness. Ask your health care provider about:  The right schedule for you to have regular tests and exams.  Things you can do on your own to prevent diseases and keep yourself healthy. What should I know about diet, weight, and exercise? Eat a healthy diet   Eat a diet that includes plenty of vegetables, fruits, low-fat  dairy products, and lean protein.  Do not eat a lot of foods that are high in solid fats, added sugars, or sodium. Maintain a healthy weight Body mass index (BMI) is used to identify weight problems. It estimates body fat based on height and weight. Your health care provider can help determine your BMI and help you achieve or maintain a healthy weight. Get regular exercise Get regular exercise. This is one of the most important things you can do for your health. Most adults should:  Exercise for at least 150 minutes each week. The exercise should increase your heart rate and make you sweat (moderate-intensity exercise).  Do strengthening exercises at least twice a week. This is in addition to the moderate-intensity exercise.  Spend less time sitting. Even light physical activity can be beneficial. Watch cholesterol and blood lipids Have your blood tested for lipids and cholesterol at 79 years of age, then have this test every 5 years. Have your cholesterol levels checked more often if:  Your lipid or cholesterol levels are high.  You are older than 78 years of age.  You are at high risk for heart disease. What should I know about cancer screening? Depending on your health history and family history, you may need to have cancer screening at various ages. This may include screening for:  Breast cancer.  Cervical cancer.  Colorectal cancer.  Skin cancer.  Lung cancer. What should I know about heart disease, diabetes, and high blood pressure? Blood pressure and heart disease  High blood pressure causes heart disease and increases the risk of stroke. This is more likely to develop in people who have high blood pressure readings, are of African descent, or are overweight.  Have your blood pressure checked: ? Every 3-5 years if you are 68-84 years of age. ? Every year if you are 45 years old or older. Diabetes Have regular diabetes screenings. This checks your fasting blood sugar  level. Have the screening done:  Once every three years after age 28 if you are at a normal weight and have a low risk for diabetes.  More often and at a younger age if you are overweight or have a high risk for diabetes. What should I know about preventing infection? Hepatitis B If you have a higher risk for hepatitis B, you should be screened for this virus. Talk with your health care provider to find out if you are at risk for hepatitis B infection. Hepatitis C Testing is recommended for:  Everyone born from [birth year] through 1965.  Anyone with known risk factors for hepatitis C. Sexually transmitted infections (STIs)  Get screened for STIs, including gonorrhea and chlamydia, if: ? You are sexually active and are younger than 78 years of age. ? You are older than 78 years of age and your health care provider tells you that you are at risk for this type of infection. ? Your sexual activity has changed since you were last screened, and you are at increased risk for chlamydia or gonorrhea. Ask your health care provider if you are at risk.  Ask your health care provider  about whether you are at high risk for HIV. Your health care provider may recommend a prescription medicine to help prevent HIV infection. If you choose to take medicine to prevent HIV, you should first get tested for HIV. You should then be tested every 3 months for as long as you are taking the medicine. Pregnancy  If you are about to stop having your period (premenopausal) and you may become pregnant, seek counseling before you get pregnant.  Take 400 to 800 micrograms (mcg) of folic acid every day if you become pregnant.  Ask for birth control (contraception) if you want to prevent pregnancy. Osteoporosis and menopause Osteoporosis is a disease in which the bones lose minerals and strength with aging. This can result in bone fractures. If you are 62 years old or older, or if you are at risk for osteoporosis and fractures,  ask your health care provider if you should:  Be screened for bone loss.  Take a calcium or vitamin D supplement to lower your risk of fractures.  Be given hormone replacement therapy (HRT) to treat symptoms of menopause. Follow these instructions at home: Lifestyle  Do not use any products that contain nicotine or tobacco, such as cigarettes, e-cigarettes, and chewing tobacco. If you need help quitting, ask your health care provider.  Do not use street drugs.  Do not share needles.  Ask your health care provider for help if you need support or information about quitting drugs. Alcohol use  Do not drink alcohol if: ? Your health care provider tells you not to drink. ? You are pregnant, may be pregnant, or are planning to become pregnant.  If you drink alcohol: ? Limit how much you use to 0-1 drink a day. ? Limit intake if you are breastfeeding.  Be aware of how much alcohol is in your drink. In the [place] one drink equals one 12 oz bottle of beer (355 mL), one 5 oz glass of wine (148 mL), or one 1 oz glass of hard liquor (44 mL). General instructions  Schedule regular health, dental, and eye exams.  Stay current with your vaccines.  Tell your health care provider if: ? You often feel depressed. ? You have ever been abused or do not feel safe at home. Summary  Adopting a healthy lifestyle and getting preventive care are important in promoting health and wellness.  Follow your health care provider's instructions about healthy diet, exercising, and getting tested or screened for diseases.  Follow your health care provider's instructions on monitoring your cholesterol and blood pressure. This information is not intended to replace advice given to you by your health care provider. Make sure you discuss any questions you have with your health care provider. Document Revised: 03/11/2018 Document Reviewed: 03/11/2018 Elsevier Patient Education  2020 Elsevier  Inc.      Agustina Caroli, [facility]

## 2019-12-21 LAB — LIPID PANEL
Chol/HDL Ratio: 2.2 ratio (ref 0.0–4.4)
Cholesterol, Total: 133 mg/dL (ref 100–199)
HDL: 60 mg/dL (ref >39)
LDL Chol Calc (NIH): 51 mg/dL (ref 0–99)
Triglycerides: 126 mg/dL (ref 0–149)
VLDL Cholesterol Cal: 22 mg/dL (ref 5–40)

## 2019-12-21 LAB — CMP14+EGFR
ALT: 12 IU/L (ref 0–32)
AST: 21 IU/L (ref 0–40)
Albumin/Globulin Ratio: 2 (ref 1.2–2.2)
Albumin: 4.4 g/dL (ref 3.7–4.7)
Alkaline Phosphatase: 75 IU/L (ref 44–121)
BUN/Creatinine Ratio: 19 (ref 12–28)
BUN: 19 mg/dL (ref 8–27)
Bilirubin Total: 0.5 mg/dL (ref 0.0–1.2)
CO2: 24 mmol/L (ref 20–29)
Calcium: 9.6 mg/dL (ref 8.7–10.3)
Chloride: 103 mmol/L (ref 96–106)
Creatinine, Ser: 1 mg/dL (ref 0.57–1.00)
GFR calc Af Amer: 62 mL/min/{1.73_m2} (ref >59)
GFR calc non Af Amer: 54 mL/min/{1.73_m2} — ABNORMAL LOW (ref >59)
Globulin, Total: 2.2 g/dL (ref 1.5–4.5)
Glucose: 86 mg/dL (ref 65–99)
Potassium: 4.3 mmol/L (ref 3.5–5.2)
Sodium: 140 mmol/L (ref 134–144)
Total Protein: 6.6 g/dL (ref 6.0–8.5)

## 2019-12-26 NOTE — Addendum Note (Signed)
Addended by: Davina Poke on: 12/26/2019 05:14 PM   Modules accepted: Level of Service

## 2020-07-06 ENCOUNTER — Other Ambulatory Visit: Payer: Self-pay | Admitting: Emergency Medicine

## 2020-07-06 DIAGNOSIS — F411 Generalized anxiety disorder: Secondary | ICD-10-CM

## 2020-07-17 ENCOUNTER — Telehealth (INDEPENDENT_AMBULATORY_CARE_PROVIDER_SITE_OTHER): Payer: Medicare Other | Admitting: Emergency Medicine

## 2020-07-17 ENCOUNTER — Encounter: Payer: Self-pay | Admitting: Emergency Medicine

## 2020-07-17 DIAGNOSIS — R509 Fever, unspecified: Secondary | ICD-10-CM | POA: Diagnosis not present

## 2020-07-17 DIAGNOSIS — J029 Acute pharyngitis, unspecified: Secondary | ICD-10-CM

## 2020-07-17 DIAGNOSIS — R059 Cough, unspecified: Secondary | ICD-10-CM | POA: Diagnosis not present

## 2020-07-17 MED ORDER — AMOXICILLIN-POT CLAVULANATE 875-125 MG PO TABS: 1.0000 | ORAL_TABLET | Freq: Two times a day (BID) | ORAL | 0 refills | Status: AC

## 2020-07-17 NOTE — Progress Notes (Signed)
Telemedicine Encounter- SOAP NOTE Established Patient MyChart video conference attempted without success.  Invitation was sent. This telephone encounter was conducted with the patient's (or proxy's) verbal consent via audio telecommunications: yes/no: Yes Patient was instructed to have this encounter in a suitably private space; and to only have persons present to whom they give permission to participate. In addition, patient identity was confirmed by use of name plus two identifiers (DOB and address).  I discussed the limitations, risks, security and privacy concerns of performing an evaluation and management service by telephone and the availability of in person appointments. I also discussed with the patient that there may be a patient responsible charge related to this service. The patient expressed understanding and agreed to proceed.  I spent a total of TIME; 0 MIN TO 60 MIN: 20 minutes talking with the patient or their proxy.  No chief complaint on file.   Subjective   Stephanie Aguilar is a 80 y.o. female established patient. Telephone visit today complaining of flulike symptoms that started 3 days ago with fever, chills, sore throat, and cough.  Denies difficulty breathing or chest pain.  Fully immunized against COVID with a booster dose.  Home COVID test done yesterday was negative.  Denies nausea or vomiting.  Able to eat and drink.  Denies rashes. No other associated symptomatology.  No other complaints or medical concerns today.  Sings in the church choir and other members also sick.  HPI   Patient Active Problem List   Diagnosis Date Noted  . CKD (chronic kidney disease), stage III ([place]) 04/13/2016  . PAD (peripheral artery disease) ([place]) 04/12/2016  . Anxiety disorder 11/07/2015  . Carotid bruit 07/06/2013  . Essential hypertension 02/04/2012  . Hyperlipidemia 02/04/2012  . Menopausal syndrome on hormone replacement therapy 02/04/2012    Past Medical History:   Diagnosis Date  . Allergy   . Anxiety   . Cataract   . Depression   . Hyperlipidemia   . Hypertension     Current Outpatient Medications  Medication Sig Dispense Refill  . amoxicillin-clavulanate (AUGMENTIN) 875-125 MG tablet Take 1 tablet by mouth 2 (two) times daily for 7 days. 14 tablet 0  . acebutolol (SECTRAL) 200 MG capsule Take 1 capsule (200 mg total) by mouth daily. 90 capsule 3  . amLODipine (NORVASC) 2.5 MG tablet TAKE 1 TABLET EACH DAY. 90 tablet 3  . aspirin 81 MG tablet Take 81 mg by mouth daily.    . Cholecalciferol (VITAMIN D) 2000 units CAPS Take 1 capsule by mouth daily.    . clobetasol (TEMOVATE) 0.05 % external solution Apply 10 drops to scalp every night at bedtime. 50 mL 11  . cycloSPORINE (RESTASIS) 0.05 % ophthalmic emulsion 1 drop 2 (two) times daily.    Marland Kitchen desloratadine (CLARINEX) 5 MG tablet Take 1 tablet (5 mg total) by mouth daily. as directed 90 tablet 3  . fluticasone (FLONASE) 50 MCG/ACT nasal spray USE 2 SPRAYS INTO EACH NOSTRIL DAILY 48 g 2  . LORazepam (ATIVAN) 0.5 MG tablet Take 1 tablet (0.5 mg total) by mouth daily as needed for anxiety. 30 tablet 1  . METRONIDAZOLE, TOPICAL, 0.75 % LOTN APPLY TOPICALLY TO AFFECTED AREA. 59 mL 11  . olmesartan (BENICAR) 40 MG tablet Take 1 tablet (40 mg total) by mouth daily. 90 tablet 3  . OVER THE COUNTER MEDICATION OTC Imodium taking daily for diarrrhea    . rosuvastatin (CRESTOR) 5 MG tablet Take 0.5 tablets (2.5 mg total) by  mouth daily. 45 tablet 3  . VIVELLE-DOT 0.0375 MG/24HR      No current facility-administered medications for this visit.    Allergies  Allergen Reactions  . Codeine   . Erythromycin     Sick on stomach  . Latex   . Lisinopril   . Sulfa Antibiotics     rash    Social History   Socioeconomic History  . Marital status: Married    Spouse name: Not on file  . Number of children: Not on file  . Years of education: Not on file  . Highest education level: Not on file   Occupational History  . Occupation: retired/homemaker  Tobacco Use  . Smoking status: Never Smoker  . Smokeless tobacco: Never Used  Substance and Sexual Activity  . Alcohol use: Yes    Comment: 1-2 oz weekly  . Drug use: No  . Sexual activity: Never  Other Topics Concern  . Not on file  Social History Narrative  . Not on file   Social Determinants of Health   Financial Resource Strain: Not on file  Food Insecurity: Not on file  Transportation Needs: Not on file  Physical Activity: Not on file  Stress: Not on file  Social Connections: Not on file  Intimate Partner Violence: Not on file    Review of Systems  Constitutional: Positive for chills and fever. Negative for malaise/fatigue.  HENT: Positive for congestion and sore throat.   Respiratory: Positive for cough.   Cardiovascular: Negative for chest pain and palpitations.  Gastrointestinal: Negative for abdominal pain, diarrhea, nausea and vomiting.  Genitourinary: Negative.  Negative for dysuria and hematuria.  Skin: Negative for rash.  Neurological: Negative.  Negative for dizziness and headaches.  All other systems reviewed and are negative.   Objective  Alert and oriented x3 no apparent respiratory distress. Vitals as reported by the patient: There were no vitals filed for this visit.  Diagnoses and all orders for this visit:  Sore throat -     amoxicillin-clavulanate (AUGMENTIN) 875-125 MG tablet; Take 1 tablet by mouth 2 (two) times daily for 7 days.  Febrile illness, acute  Cough  Clinically stable.  No red flag signs or symptoms.  COVID infection unlikely. Take medications as prescribed.  Anticipatory guidance given. Advised to contact the office if no better or worse during the next several days.   I discussed the assessment and treatment plan with the patient. The patient was provided an opportunity to ask questions and all were answered. The patient agreed with the plan and demonstrated an  understanding of the instructions.   The patient was advised to call back or seek an in-person evaluation if the symptoms worsen or if the condition fails to improve as anticipated.  I provided 20 minutes of non-face-to-face time during this encounter.  Horald Pollen, MD  Primary Care at [facility]

## 2020-07-18 ENCOUNTER — Telehealth: Payer: Self-pay | Admitting: Emergency Medicine

## 2020-07-18 NOTE — Telephone Encounter (Signed)
Patient calling to make [place] aware she tested positive for covid

## 2020-09-11 ENCOUNTER — Other Ambulatory Visit: Payer: Self-pay | Admitting: Emergency Medicine

## 2020-09-11 DIAGNOSIS — F411 Generalized anxiety disorder: Secondary | ICD-10-CM

## 2020-09-25 ENCOUNTER — Telehealth: Payer: Self-pay | Admitting: Emergency Medicine

## 2020-09-25 NOTE — Telephone Encounter (Signed)
LVM for pt on husbands phone number. To rtn my call to schedule AWV with [facility]. Please schedule AWV with [facility] if pt calls the office.

## 2020-10-03 NOTE — Telephone Encounter (Signed)
Patient declined AWV 

## 2020-11-07 ENCOUNTER — Encounter: Payer: Self-pay | Admitting: Emergency Medicine

## 2020-11-07 ENCOUNTER — Other Ambulatory Visit: Payer: Self-pay | Admitting: Emergency Medicine

## 2020-11-07 DIAGNOSIS — F411 Generalized anxiety disorder: Secondary | ICD-10-CM

## 2020-11-07 MED ORDER — LORAZEPAM 0.5 MG PO TABS: 0.5000 mg | ORAL_TABLET | Freq: Two times a day (BID) | ORAL | 2 refills | Status: DC | PRN

## 2020-11-07 NOTE — Telephone Encounter (Signed)
New prescription sent to pharmacy of record.  Thanks.

## 2020-11-08 ENCOUNTER — Encounter: Payer: Self-pay | Admitting: Emergency Medicine

## 2020-11-08 ENCOUNTER — Other Ambulatory Visit: Payer: Self-pay | Admitting: Emergency Medicine

## 2020-11-08 DIAGNOSIS — F411 Generalized anxiety disorder: Secondary | ICD-10-CM

## 2020-11-08 MED ORDER — LORAZEPAM 0.5 MG PO TABS: 0.5000 mg | ORAL_TABLET | Freq: Two times a day (BID) | ORAL | 2 refills | Status: DC | PRN

## 2020-11-08 NOTE — Telephone Encounter (Signed)
Done. Thanks.

## 2020-12-21 ENCOUNTER — Other Ambulatory Visit: Payer: Self-pay

## 2020-12-21 ENCOUNTER — Ambulatory Visit (INDEPENDENT_AMBULATORY_CARE_PROVIDER_SITE_OTHER): Payer: Medicare Other | Admitting: Emergency Medicine

## 2020-12-21 ENCOUNTER — Encounter: Payer: Self-pay | Admitting: Emergency Medicine

## 2020-12-21 VITALS — BP 138/82 | HR 59 | Temp 98.4°F | Ht 59.0 in | Wt 103.0 lb

## 2020-12-21 DIAGNOSIS — N1831 Chronic kidney disease, stage 3a: Secondary | ICD-10-CM

## 2020-12-21 DIAGNOSIS — I10 Essential (primary) hypertension: Secondary | ICD-10-CM | POA: Diagnosis not present

## 2020-12-21 DIAGNOSIS — E785 Hyperlipidemia, unspecified: Secondary | ICD-10-CM

## 2020-12-21 DIAGNOSIS — Z23 Encounter for immunization: Secondary | ICD-10-CM | POA: Diagnosis not present

## 2020-12-21 DIAGNOSIS — E782 Mixed hyperlipidemia: Secondary | ICD-10-CM

## 2020-12-21 DIAGNOSIS — Z0000 Encounter for general adult medical examination without abnormal findings: Secondary | ICD-10-CM | POA: Diagnosis not present

## 2020-12-21 DIAGNOSIS — F411 Generalized anxiety disorder: Secondary | ICD-10-CM

## 2020-12-21 DIAGNOSIS — I739 Peripheral vascular disease, unspecified: Secondary | ICD-10-CM | POA: Diagnosis not present

## 2020-12-21 DIAGNOSIS — J309 Allergic rhinitis, unspecified: Secondary | ICD-10-CM

## 2020-12-21 LAB — COMPREHENSIVE METABOLIC PANEL
ALT: 15 U/L (ref 0–35)
AST: 18 U/L (ref 0–37)
Albumin: 4.3 g/dL (ref 3.5–5.2)
Alkaline Phosphatase: 63 U/L (ref 39–117)
BUN: 22 mg/dL (ref 6–23)
CO2: 30 mEq/L (ref 19–32)
Calcium: 9.6 mg/dL (ref 8.4–10.5)
Chloride: 104 mEq/L (ref 96–112)
Creatinine, Ser: 1.07 mg/dL (ref 0.40–1.20)
GFR: 49.37 mL/min — ABNORMAL LOW (ref >60.00)
Glucose, Bld: 90 mg/dL (ref 70–99)
Potassium: 4.1 mEq/L (ref 3.5–5.1)
Sodium: 140 mEq/L (ref 135–145)
Total Bilirubin: 0.5 mg/dL (ref 0.2–1.2)
Total Protein: 7.4 g/dL (ref 6.0–8.3)

## 2020-12-21 LAB — LIPID PANEL
Cholesterol: 138 mg/dL (ref 0–200)
HDL: 59 mg/dL (ref >39.00)
LDL Cholesterol: 49 mg/dL (ref 0–99)
NonHDL: 78.84
Total CHOL/HDL Ratio: 2
Triglycerides: 148 mg/dL (ref 0.0–149.0)
VLDL: 29.6 mg/dL (ref 0.0–40.0)

## 2020-12-21 MED ORDER — LORAZEPAM 0.5 MG PO TABS: 0.5000 mg | ORAL_TABLET | Freq: Two times a day (BID) | ORAL | 3 refills | Status: DC | PRN

## 2020-12-21 MED ORDER — ROSUVASTATIN CALCIUM 5 MG PO TABS: 2.5000 mg | ORAL_TABLET | Freq: Every day | ORAL | 3 refills | Status: DC

## 2020-12-21 MED ORDER — ACEBUTOLOL HCL 200 MG PO CAPS: 200.0000 mg | ORAL_CAPSULE | Freq: Every day | ORAL | 3 refills | Status: DC

## 2020-12-21 MED ORDER — AMLODIPINE BESYLATE 2.5 MG PO TABS: ORAL_TABLET | ORAL | 3 refills | Status: DC

## 2020-12-21 MED ORDER — DESLORATADINE 5 MG PO TABS: 5.0000 mg | ORAL_TABLET | Freq: Every day | ORAL | 3 refills | Status: DC

## 2020-12-21 NOTE — Progress Notes (Signed)
Stephanie Aguilar 80 y.o.   Chief Complaint  Patient presents with   Annual Exam    Med refill, acebutolol, amlodipine, desloratadine, lorazepam, and rosuvastatin. Pt would like a 90 day supply of lorazepam.    HISTORY OF PRESENT ILLNESS: This is a 80 y.o. female here for annual exam. A1A Doing well.  Has no complaints or medical concerns. Has history of hypertension on acebutolol, olmesartan and amlodipine. History of dyslipidemia on rosuvastatin. History of chronic anxiety.  Takes lorazepam as needed. Stays active and has good nutrition.  HPI   Prior to Admission medications   Medication Sig Start Date End Date Taking? Authorizing Provider  acebutolol (SECTRAL) 200 MG capsule Take 1 capsule (200 mg total) by mouth daily. 12/20/19 12/21/20 Yes Willadeen Colantuono, Ines Bloomer, MD  amLODipine (NORVASC) 2.5 MG tablet TAKE 1 TABLET EACH DAY. 12/20/19  Yes Raechal Raben, Ines Bloomer, MD  aspirin 81 MG tablet Take 81 mg by mouth daily.   Yes [provider]  Cholecalciferol (VITAMIN D) 2000 units CAPS Take 1 capsule by mouth daily.   Yes [provider]  clobetasol (TEMOVATE) 0.05 % external solution Apply 10 drops to scalp every night at bedtime. 04/11/15  Yes Darlyne Russian, MD  cycloSPORINE (RESTASIS) 0.05 % ophthalmic emulsion 1 drop 2 (two) times daily.   Yes [provider]  desloratadine (CLARINEX) 5 MG tablet Take 1 tablet (5 mg total) by mouth daily. as directed 12/20/19  Yes Kanai Berrios, Ines Bloomer, MD  fluticasone [facility]) 50 MCG/ACT nasal spray USE 2 SPRAYS INTO EACH NOSTRIL DAILY 09/20/14  Yes Daub, Loura Back, MD  LORazepam (ATIVAN) 0.5 MG tablet Take 1 tablet (0.5 mg total) by mouth 2 (two) times daily as needed for anxiety. 11/08/20  Yes Essynce Munsch, Ines Bloomer, MD  METRONIDAZOLE, TOPICAL, 0.75 % LOTN APPLY TOPICALLY TO AFFECTED AREA. 09/21/15  Yes Daub, Loura Back, MD  olmesartan (BENICAR) 40 MG tablet Take 1 tablet (40 mg total) by mouth daily. 12/20/19  Yes Nanie Dunkleberger,  Ines Bloomer, MD  OVER THE COUNTER MEDICATION OTC Imodium taking daily for diarrrhea   Yes [provider]  VIVELLE-DOT 0.0375 MG/24HR  09/05/14  Yes [provider]  rosuvastatin (CRESTOR) 5 MG tablet Take 0.5 tablets (2.5 mg total) by mouth daily. 12/20/19 03/19/20  Horald Pollen, MD    Allergies  Allergen Reactions   Codeine    Erythromycin     Sick on stomach   Latex    Lisinopril    Sulfa Antibiotics     rash    Patient Active Problem List   Diagnosis Date Noted   CKD (chronic kidney disease), stage III ([place]) 04/13/2016   PAD (peripheral artery disease) ([place]) 04/12/2016   Anxiety disorder 11/07/2015   Carotid bruit 07/06/2013   Essential hypertension 02/04/2012   Hyperlipidemia 02/04/2012   Menopausal syndrome on hormone replacement therapy 02/04/2012    Past Medical History:  Diagnosis Date   Allergy    Anxiety    Cataract    Depression    Hyperlipidemia    Hypertension     Past Surgical History:  Procedure Laterality Date   ABDOMINAL HYSTERECTOMY     APPENDECTOMY     BREAST SURGERY     DENTAL SURGERY     TONSILLECTOMY AND ADENOIDECTOMY      Social History   Socioeconomic History   Marital status: Married    Spouse name: Not on file   Number of children: Not on file   Years of education: Not  on file   Highest education level: Not on file  Occupational History   Occupation: retired/homemaker  Tobacco Use   Smoking status: Never   Smokeless tobacco: Never  Substance and Sexual Activity   Alcohol use: Yes    Comment: 1-2 oz weekly   Drug use: No   Sexual activity: Never  Other Topics Concern   Not on file  Social History Narrative   Not on file   Social Determinants of Health   Financial Resource Strain: Not on file  Food Insecurity: Not on file  Transportation Needs: Not on file  Physical Activity: Not on file  Stress: Not on file  Social Connections: Not on file  Intimate Partner Violence: Not on file     Family History  Problem Relation Age of Onset   Hypertension Mother    Alcohol abuse Father    COPD Brother    Hypertension Brother    Alcohol abuse Brother    Heart disease Maternal Grandmother    Cancer Maternal Grandfather    Stroke Paternal Grandmother    Gout Brother    Alcohol abuse Brother      Review of Systems  Constitutional: Negative.  Negative for chills and fever.  HENT: Negative.  Negative for congestion and sore throat.   Respiratory: Negative.  Negative for cough and shortness of breath.   Cardiovascular: Negative.  Negative for chest pain and palpitations.  Gastrointestinal: Negative.  Negative for abdominal pain, diarrhea, nausea and vomiting.  Genitourinary: Negative.  Negative for dysuria and hematuria.  Skin: Negative.  Negative for rash.  Neurological:  Negative for dizziness and headaches.  All other systems reviewed and are negative.  Vitals:   12/21/20 0842  BP: 138/82  Pulse: (!) 59  Temp: 98.4 F (36.9 C)  SpO2: 96%   Wt Readings from Last 3 Encounters:  12/21/20 103 lb (46.7 kg)  12/20/19 104 lb 9.6 oz (47.4 kg)  12/15/18 106 lb 6.4 oz (48.3 kg)    Physical Exam Vitals reviewed.  Constitutional:      Appearance: Normal appearance.  HENT:     Head: Normocephalic.     Right Ear: Tympanic membrane, ear canal and external ear normal.     Left Ear: Tympanic membrane, ear canal and external ear normal.     Mouth/Throat:     Mouth: Mucous membranes are moist.     Pharynx: Oropharynx is clear.  Eyes:     Extraocular Movements: Extraocular movements intact.     Conjunctiva/sclera: Conjunctivae normal.     Pupils: Pupils are equal, round, and reactive to light.  Cardiovascular:     Rate and Rhythm: Normal rate and regular rhythm.     Pulses: Normal pulses.     Heart sounds: Normal heart sounds.  Pulmonary:     Effort: Pulmonary effort is normal.     Breath sounds: Normal breath sounds.  Abdominal:     General: Bowel sounds are  normal. There is no distension.     Palpations: Abdomen is soft.     Tenderness: There is no abdominal tenderness.  Musculoskeletal:        General: Normal range of motion.     Cervical back: Normal range of motion and neck supple.     Right lower leg: No edema.     Left lower leg: No edema.  Skin:    General: Skin is warm and dry.     Capillary Refill: Capillary refill takes less than 2 seconds.  Neurological:  General: No focal deficit present.     Mental Status: She is alert and oriented to person, place, and time.  Psychiatric:        Mood and Affect: Mood normal.        Behavior: Behavior normal.     ASSESSMENT & PLAN: Leniyah was seen today for annual exam.  Diagnoses and all orders for this visit:  Routine general medical examination at a health care facility  Need for influenza vaccination -     Flu Vaccine QUAD High Dose(Fluad)  Essential hypertension -     Comprehensive metabolic panel  Dyslipidemia -     Lipid panel  PAD (peripheral artery disease) ([facility])  Stage 3a chronic kidney disease (HCC)  Mixed hyperlipidemia -     rosuvastatin (CRESTOR) 5 MG tablet; Take 0.5 tablets (2.5 mg total) by mouth daily.  Essential hypertension, benign -     amLODipine (NORVASC) 2.5 MG tablet; TAKE 1 TABLET EACH DAY.  Generalized anxiety disorder -     LORazepam (ATIVAN) 0.5 MG tablet; Take 1 tablet (0.5 mg total) by mouth 2 (two) times daily as needed for anxiety.  Allergic rhinitis, unspecified seasonality, unspecified trigger -     desloratadine (CLARINEX) 5 MG tablet; Take 1 tablet (5 mg total) by mouth daily. as directed  Other orders -     acebutolol (SECTRAL) 200 MG capsule; Take 1 capsule (200 mg total) by mouth daily.  Modifiable risk factors discussed with patient. Anticipatory guidance according to age provided. The following topics were also discussed: Social Determinants of Health Smoking.  Non-smoker Diet and nutrition.  Has good  nutrition. Benefits of exercise.  Stays physically active. Cancer screening and review of most recent mammogram. Sees gynecologist on a regular basis.  Was recommended to get bone scan. Vaccinations recommendations Cardiovascular risk assessment The 10-year ASCVD risk score (Arnett DK, et al., 2019) is: 36.4%   Values used to calculate the score:     Age: 2 years     Sex: Female     Is Non-Hispanic African American: No     Diabetic: No     Tobacco smoker: No     Systolic Blood Pressure: 355 mmHg     Is BP treated: Yes     HDL Cholesterol: 60 mg/dL     Total Cholesterol: 133 mg/dL  Mental health including depression and anxiety and chronic use of lorazepam.  Uses medication sparingly and judiciously. Fall and accident prevention  Patient Instructions  Health Maintenance, Female Adopting a healthy lifestyle and getting preventive care are important in promoting health and wellness. Ask your health care provider about: The right schedule for you to have regular tests and exams. Things you can do on your own to prevent diseases and keep yourself healthy. What should I know about diet, weight, and exercise? Eat a healthy diet  Eat a diet that includes plenty of vegetables, fruits, low-fat dairy products, and lean protein. Do not eat a lot of foods that are high in solid fats, added sugars, or sodium. Maintain a healthy weight Body mass index (BMI) is used to identify weight problems. It estimates body fat based on height and weight. Your health care provider can help determine your BMI and help you achieve or maintain a healthy weight. Get regular exercise Get regular exercise. This is one of the most important things you can do for your health. Most adults should: Exercise for at least 150 minutes each week. The exercise should increase your heart  rate and make you sweat (moderate-intensity exercise). Do strengthening exercises at least twice a week. This is in addition to the  moderate-intensity exercise. Spend less time sitting. Even light physical activity can be beneficial. Watch cholesterol and blood lipids Have your blood tested for lipids and cholesterol at 79 years of age, then have this test every 5 years. Have your cholesterol levels checked more often if: Your lipid or cholesterol levels are high. You are older than 79 years of age. You are at high risk for heart disease. What should I know about cancer screening? Depending on your health history and family history, you may need to have cancer screening at various ages. This may include screening for: Breast cancer. Cervical cancer. Colorectal cancer. Skin cancer. Lung cancer. What should I know about heart disease, diabetes, and high blood pressure? Blood pressure and heart disease High blood pressure causes heart disease and increases the risk of stroke. This is more likely to develop in people who have high blood pressure readings, are of African descent, or are overweight. Have your blood pressure checked: Every 3-5 years if you are 35-51 years of age. Every year if you are 63 years old or older. Diabetes Have regular diabetes screenings. This checks your fasting blood sugar level. Have the screening done: Once every three years after age 30 if you are at a normal weight and have a low risk for diabetes. More often and at a younger age if you are overweight or have a high risk for diabetes. What should I know about preventing infection? Hepatitis B If you have a higher risk for hepatitis B, you should be screened for this virus. Talk with your health care provider to find out if you are at risk for hepatitis B infection. Hepatitis C Testing is recommended for: Everyone born from [birth year] through 1964. Anyone with known risk factors for hepatitis C. Sexually transmitted infections (STIs) Get screened for STIs, including gonorrhea and chlamydia, if: You are sexually active and are younger than 79  years of age. You are older than 79 years of age and your health care provider tells you that you are at risk for this type of infection. Your sexual activity has changed since you were last screened, and you are at increased risk for chlamydia or gonorrhea. Ask your health care provider if you are at risk. Ask your health care provider about whether you are at high risk for HIV. Your health care provider may recommend a prescription medicine to help prevent HIV infection. If you choose to take medicine to prevent HIV, you should first get tested for HIV. You should then be tested every 3 months for as long as you are taking the medicine. Pregnancy If you are about to stop having your period (premenopausal) and you may become pregnant, seek counseling before you get pregnant. Take 400 to 800 micrograms (mcg) of folic acid every day if you become pregnant. Ask for birth control (contraception) if you want to prevent pregnancy. Osteoporosis and menopause Osteoporosis is a disease in which the bones lose minerals and strength with aging. This can result in bone fractures. If you are 33 years old or older, or if you are at risk for osteoporosis and fractures, ask your health care provider if you should: Be screened for bone loss. Take a calcium or vitamin D supplement to lower your risk of fractures. Be given hormone replacement therapy (HRT) to treat symptoms of menopause. Follow these instructions at home: Lifestyle Do not  use any products that contain nicotine or tobacco, such as cigarettes, e-cigarettes, and chewing tobacco. If you need help quitting, ask your health care provider. Do not use street drugs. Do not share needles. Ask your health care provider for help if you need support or information about quitting drugs. Alcohol use Do not drink alcohol if: Your health care provider tells you not to drink. You are pregnant, may be pregnant, or are planning to become pregnant. If you drink  alcohol: Limit how much you use to 0-1 drink a day. Limit intake if you are breastfeeding. Be aware of how much alcohol is in your drink. In the [place] one drink equals one 12 oz bottle of beer (355 mL), one 5 oz glass of wine (148 mL), or one 1 oz glass of hard liquor (44 mL). General instructions Schedule regular health, dental, and eye exams. Stay current with your vaccines. Tell your health care provider if: You often feel depressed. You have ever been abused or do not feel safe at home. Summary Adopting a healthy lifestyle and getting preventive care are important in promoting health and wellness. Follow your health care provider's instructions about healthy diet, exercising, and getting tested or screened for diseases. Follow your health care provider's instructions on monitoring your cholesterol and blood pressure. This information is not intended to replace advice given to you by your health care provider. Make sure you discuss any questions you have with your health care provider. Document Revised: 05/26/2020 Document Reviewed: 03/11/2018 Elsevier Patient Education  2022 [place], MD [place] [facility]

## 2020-12-21 NOTE — Patient Instructions (Signed)
Health Maintenance, Female Adopting a healthy lifestyle and getting preventive care are important in promoting health and wellness. Ask your health care provider about: The right schedule for you to have regular tests and exams. Things you can do on your own to prevent diseases and keep yourself healthy. What should I know about diet, weight, and exercise? Eat a healthy diet  Eat a diet that includes plenty of vegetables, fruits, low-fat dairy products, and lean protein. Do not eat a lot of foods that are high in solid fats, added sugars, or sodium. Maintain a healthy weight Body mass index (BMI) is used to identify weight problems. It estimates body fat based on height and weight. Your health care provider can help determine your BMI and help you achieve or maintain a healthy weight. Get regular exercise Get regular exercise. This is one of the most important things you can do for your health. Most adults should: Exercise for at least 150 minutes each week. The exercise should increase your heart rate and make you sweat (moderate-intensity exercise). Do strengthening exercises at least twice a week. This is in addition to the moderate-intensity exercise. Spend less time sitting. Even light physical activity can be beneficial. Watch cholesterol and blood lipids Have your blood tested for lipids and cholesterol at 79 years of age, then have this test every 5 years. Have your cholesterol levels checked more often if: Your lipid or cholesterol levels are high. You are older than 79 years of age. You are at high risk for heart disease. What should I know about cancer screening? Depending on your health history and family history, you may need to have cancer screening at various ages. This may include screening for: Breast cancer. Cervical cancer. Colorectal cancer. Skin cancer. Lung cancer. What should I know about heart disease, diabetes, and high blood pressure? Blood pressure and heart  disease High blood pressure causes heart disease and increases the risk of stroke. This is more likely to develop in people who have high blood pressure readings, are of African descent, or are overweight. Have your blood pressure checked: Every 3-5 years if you are 18-39 years of age. Every year if you are 40 years old or older. Diabetes Have regular diabetes screenings. This checks your fasting blood sugar level. Have the screening done: Once every three years after age 40 if you are at a normal weight and have a low risk for diabetes. More often and at a younger age if you are overweight or have a high risk for diabetes. What should I know about preventing infection? Hepatitis B If you have a higher risk for hepatitis B, you should be screened for this virus. Talk with your health care provider to find out if you are at risk for hepatitis B infection. Hepatitis C Testing is recommended for: Everyone born from 1945 through 1965. Anyone with known risk factors for hepatitis C. Sexually transmitted infections (STIs) Get screened for STIs, including gonorrhea and chlamydia, if: You are sexually active and are younger than 79 years of age. You are older than 79 years of age and your health care provider tells you that you are at risk for this type of infection. Your sexual activity has changed since you were last screened, and you are at increased risk for chlamydia or gonorrhea. Ask your health care provider if you are at risk. Ask your health care provider about whether you are at high risk for HIV. Your health care provider may recommend a prescription medicine   to help prevent HIV infection. If you choose to take medicine to prevent HIV, you should first get tested for HIV. You should then be tested every 3 months for as long as you are taking the medicine. Pregnancy If you are about to stop having your period (premenopausal) and you may become pregnant, seek counseling before you get  pregnant. Take 400 to 800 micrograms (mcg) of folic acid every day if you become pregnant. Ask for birth control (contraception) if you want to prevent pregnancy. Osteoporosis and menopause Osteoporosis is a disease in which the bones lose minerals and strength with aging. This can result in bone fractures. If you are 65 years old or older, or if you are at risk for osteoporosis and fractures, ask your health care provider if you should: Be screened for bone loss. Take a calcium or vitamin D supplement to lower your risk of fractures. Be given hormone replacement therapy (HRT) to treat symptoms of menopause. Follow these instructions at home: Lifestyle Do not use any products that contain nicotine or tobacco, such as cigarettes, e-cigarettes, and chewing tobacco. If you need help quitting, ask your health care provider. Do not use street drugs. Do not share needles. Ask your health care provider for help if you need support or information about quitting drugs. Alcohol use Do not drink alcohol if: Your health care provider tells you not to drink. You are pregnant, may be pregnant, or are planning to become pregnant. If you drink alcohol: Limit how much you use to 0-1 drink a day. Limit intake if you are breastfeeding. Be aware of how much alcohol is in your drink. In the [place] one drink equals one 12 oz bottle of beer (355 mL), one 5 oz glass of wine (148 mL), or one 1 oz glass of hard liquor (44 mL). General instructions Schedule regular health, dental, and eye exams. Stay current with your vaccines. Tell your health care provider if: You often feel depressed. You have ever been abused or do not feel safe at home. Summary Adopting a healthy lifestyle and getting preventive care are important in promoting health and wellness. Follow your health care provider's instructions about healthy diet, exercising, and getting tested or screened for diseases. Follow your health care provider's  instructions on monitoring your cholesterol and blood pressure. This information is not intended to replace advice given to you by your health care provider. Make sure you discuss any questions you have with your health care provider. Document Revised: 05/26/2020 Document Reviewed: 03/11/2018 Elsevier Patient Education  2022 Elsevier Inc.  

## 2021-02-05 ENCOUNTER — Other Ambulatory Visit: Payer: Self-pay | Admitting: Emergency Medicine

## 2021-02-05 DIAGNOSIS — I10 Essential (primary) hypertension: Secondary | ICD-10-CM

## 2021-08-30 ENCOUNTER — Other Ambulatory Visit: Payer: Self-pay | Admitting: Emergency Medicine

## 2021-08-30 DIAGNOSIS — F411 Generalized anxiety disorder: Secondary | ICD-10-CM

## 2021-10-03 ENCOUNTER — Ambulatory Visit: Payer: Medicare Other

## 2021-12-11 ENCOUNTER — Telehealth: Payer: Self-pay | Admitting: Emergency Medicine

## 2021-12-11 NOTE — Telephone Encounter (Signed)
LVM for pt to rtn my call to schedule AWV with [facility] call back # [phone] 

## 2021-12-13 ENCOUNTER — Ambulatory Visit (INDEPENDENT_AMBULATORY_CARE_PROVIDER_SITE_OTHER): Payer: Medicare Other

## 2021-12-13 VITALS — Ht 59.0 in

## 2021-12-13 DIAGNOSIS — Z0000 Encounter for general adult medical examination without abnormal findings: Secondary | ICD-10-CM

## 2021-12-13 NOTE — Progress Notes (Signed)
Virtual Visit via Telephone Note  I connected with  Stephanie Aguilar on 12/13/21 at  3:15 PM EDT by telephone and verified that I am speaking with the correct person using two identifiers.  Location: Patient: Home Provider: [facility] Persons participating in the virtual visit: patient/Nurse Health Advisor   I discussed the limitations, risks, security and privacy concerns of performing an evaluation and management service by telephone and the availability of in person appointments. The patient expressed understanding and agreed to proceed.  Interactive audio and video telecommunications were attempted between this nurse and patient, however failed, due to patient having technical difficulties OR patient did not have access to video capability.  We continued and completed visit with audio only.  Some vital signs may be absent or patient reported.   Sheral Flow, LPN  Subjective:   Stephanie Aguilar is a 81 y.o. female who presents for Medicare Annual (Subsequent) preventive examination.  Review of Systems     Cardiac Risk Factors include: advanced age (>84mn, >>50women);hypertension;dyslipidemia;family history of premature cardiovascular disease     Objective:    Today's Vitals   12/13/21 1519  Height: '4\' 11"'$  (1.499 m)   Body mass index is 20.8 kg/m.     12/13/2021    3:31 PM 12/20/2019    8:50 AM 12/11/2016    8:34 AM 04/12/2016    8:52 AM  Advanced Directives  Does Patient Have a Medical Advance Directive? Yes Yes Yes Yes  Type of [place]amed[place]of [place] will HH[place]Living will [place]Living will   Does patient want to make changes to medical advance directive?  Yes (ED - send information to MyChart)    Copy of [place] Chart? No - copy requested  No - copy requested     Current Medications (verified) Outpatient Encounter Medications as of  12/13/2021  Medication Sig   acebutolol (SECTRAL) 200 MG capsule Take 1 capsule (200 mg total) by mouth daily.   amLODipine (NORVASC) 2.5 MG tablet TAKE 1 TABLET EACH DAY.   aspirin 81 MG tablet Take 81 mg by mouth daily.   Cholecalciferol (VITAMIN D) 2000 units CAPS Take 1 capsule by mouth daily.   clobetasol (TEMOVATE) 0.05 % external solution Apply 10 drops to scalp every night at bedtime.   cycloSPORINE (RESTASIS) 0.05 % ophthalmic emulsion 1 drop 2 (two) times daily.   desloratadine (CLARINEX) 5 MG tablet Take 1 tablet (5 mg total) by mouth daily. as directed   fluticasone (FLONASE) 50 MCG/ACT nasal spray USE 2 SPRAYS INTO EACH NOSTRIL DAILY   LORazepam (ATIVAN) 0.5 MG tablet Take 1 tablet (0.5 mg total) by mouth 2 (two) times daily as needed for anxiety.   METRONIDAZOLE, TOPICAL, 0.75 % LOTN APPLY TOPICALLY TO AFFECTED AREA.   olmesartan (BENICAR) 40 MG tablet TAKE 1 TABLET ONCE DAILY.   OVER THE COUNTER MEDICATION OTC Imodium taking daily for diarrrhea   rosuvastatin (CRESTOR) 5 MG tablet Take 0.5 tablets (2.5 mg total) by mouth daily.   VIVELLE-DOT 0.0375 MG/24HR    No facility-administered encounter medications on file as of 12/13/2021.    Allergies (verified) Codeine, Erythromycin, Latex, Lisinopril, and Sulfa antibiotics   History: Past Medical History:  Diagnosis Date   Allergy    Anxiety    Cataract    Depression    Hyperlipidemia    Hypertension    Past Surgical History:  Procedure Laterality Date   ABDOMINAL HYSTERECTOMY  APPENDECTOMY     BREAST SURGERY     DENTAL SURGERY     TONSILLECTOMY AND ADENOIDECTOMY     Family History  Problem Relation Age of Onset   Hypertension Mother    Alcohol abuse Father    COPD Brother    Hypertension Brother    Alcohol abuse Brother    Heart disease Maternal Grandmother    Cancer Maternal Grandfather    Stroke Paternal Grandmother    Gout Brother    Alcohol abuse Brother    Social History   Socioeconomic History    Marital status: Married    Spouse name: Not on file   Number of children: Not on file   Years of education: Not on file   Highest education level: Not on file  Occupational History   Occupation: Animal nutritionist  Tobacco Use   Smoking status: Never   Smokeless tobacco: Never  Substance and Sexual Activity   Alcohol use: Yes    Comment: 1-2 oz weekly   Drug use: No   Sexual activity: Never  Other Topics Concern   Not on file  Social History Narrative   Not on file   Social Determinants of Health   Financial Resource Strain: Low Risk  (12/13/2021)   Overall Financial Resource Strain (CARDIA)    Difficulty of Paying Living Expenses: Not hard at all  Food Insecurity: No Food Insecurity (12/13/2021)   Hunger Vital Sign    Worried About Running Out of Food in the Last Year: Never true    [place] in the Last Year: Never true  Transportation Needs: No Transportation Needs (12/13/2021)   PRAPARE - Hydrologist (Medical): No    Lack of Transportation (Non-Medical): No  Physical Activity: Sufficiently Active (12/13/2021)   Exercise Vital Sign    Days of Exercise per Week: 7 days    Minutes of Exercise per Session: 30 min  Stress: No Stress Concern Present (12/13/2021)   [place]    Feeling of Stress : Not at all  Social Connections: [place] (12/13/2021)   Social Connection and Isolation Panel [NHANES]    Frequency of Communication with Friends and Family: More than three times a week    Frequency of Social Gatherings with Friends and Family: More than three times a week    Attends Religious Services: More than 4 times per year    Active Member of [employer] or Organizations: Yes    Attends Music therapist: More than 4 times per year    Marital Status: Married    Tobacco Counseling Counseling given: Not Answered   Clinical Intake:  Pre-visit  preparation completed: Yes  Pain : No/denies pain     BMI - recorded: 20.8 (from 12/21/2020) Nutritional Risks: None Diabetes: No  How often do you need to have someone help you when you read instructions, pamphlets, or other written materials from your doctor or pharmacy?: 1 - Never What is the last grade level you completed in school?: Bachelor's Degree  Diabetic? NO  Interpreter Needed?: No  Information entered by :: Lisette Abu, LPN.   Activities of Daily Living    12/13/2021    3:37 PM  In your present state of health, do you have any difficulty performing the following activities:  Hearing? 0  Vision? 0  Difficulty concentrating or making decisions? 0  Walking or climbing stairs? 0  Dressing or bathing? 0  Doing errands, shopping? 0  Preparing Food and eating ? N  Using the Toilet? N  In the past six months, have you accidently leaked urine? N  Do you have problems with loss of bowel control? N  Managing your Medications? N  Managing your Finances? N  Housekeeping or managing your Housekeeping? N    Patient Care Team: Horald Pollen, MD as PCP - General (Internal Medicine) Brien Few, MD as Consulting Physician (Obstetrics and Gynecology) Adrian Prows, MD as Consulting Physician (Cardiology) Katy Fitch, Darlina Guys, MD as Consulting Physician (Ophthalmology) Clent Jacks, MD as Referring Physician (Ophthalmology)  Indicate any recent Medical Services you may have received from other than [facility] providers in the past year (date may be approximate).     Assessment:   This is a routine wellness examination for [facility].  Hearing/Vision screen Hearing Screening - Comments:: Denies hearing difficulties   Vision Screening - Comments:: Wears rx glasses - up to date with routine eye exams with Clent Jacks, MD.   Dietary issues and exercise activities discussed: Current Exercise Habits: Home exercise routine, Type of exercise: walking;Other - see  comments (gardening), Time (Minutes): 60, Frequency (Times/Week): 5, Weekly Exercise (Minutes/Week): 300, Intensity: Moderate   Goals Addressed             This Visit's Progress    Continue to walk and do my gardening.        Depression Screen    12/13/2021    4:29 PM 12/21/2020    9:24 AM 12/20/2019    8:46 AM 12/15/2018    8:30 AM 12/11/2017    8:33 AM 09/23/2017    4:45 PM 06/30/2017    2:56 PM  PHQ 2/9 Scores  PHQ - 2 Score 0 0 0 0 0 0 0    Fall Risk    12/13/2021    3:33 PM 12/21/2020    9:24 AM 12/20/2019    8:45 AM 12/15/2018    8:29 AM 12/11/2017    8:33 AM  Fall Risk   Falls in the past year? 0 0 0 0 No  Number falls in past yr: 0 0 0    Injury with Fall? 0 0 0    Risk for fall due to : No Fall Risks      Follow up Falls prevention discussed  Falls evaluation completed Falls evaluation completed     [place]:  Any stairs in or around the home? Yes  If so, are there any without handrails? No  Home free of loose throw rugs in walkways, pet beds, electrical cords, etc? Yes  Adequate lighting in your home to reduce risk of falls? Yes   ASSISTIVE DEVICES UTILIZED TO PREVENT FALLS:  Life alert? No  Use of a cane, walker or w/c? No  Grab bars in the bathroom? Yes  Shower chair or bench in shower? Yes  Elevated toilet seat or a handicapped toilet? Yes   TIMED UP AND GO:  Was the test performed? No . This was a phone visit.  Cognitive Function:        12/13/2021    4:29 PM 12/20/2019    8:48 AM  6CIT Screen  What Year? 0 points 0 points  What month? 0 points 0 points  What time? 0 points 0 points  Count back from 20 0 points 0 points  Months in reverse 0 points 0 points  Repeat phrase 0 points 0 points  Total Score 0 points  0 points    Immunizations Immunization History  Administered Date(s) Administered   Fluad Quad(high Dose 65+) 12/15/2018, 12/20/2019, 12/21/2020   Influenza Split 12/19/2011   Influenza,inj,Quad  PF,6+ Mos 12/22/2012, 12/21/2013, 12/20/2014, 01/01/2016, 12/05/2016, 12/11/2017   Meningococcal Polysaccharide 02/14/2009   PFIZER(Purple Top)SARS-COV-2 Vaccination 04/20/2019, 05/11/2019, 01/13/2020   Pneumococcal Conjugate-13 03/15/2014   Pneumococcal Polysaccharide-23 02/14/2009   Tdap 02/03/2007, 12/20/2019   Zoster Recombinat (Shingrix) 05/07/2018, 09/23/2018    TDAP status: Up to date  Flu Vaccine status: Due, Education has been provided regarding the importance of this vaccine. Advised may receive this vaccine at local pharmacy or Health Dept. Aware to provide a copy of the vaccination record if obtained from local pharmacy or Health Dept. Verbalized acceptance and understanding.  Pneumococcal vaccine status: Up to date  Covid-19 vaccine status: Completed vaccines  Qualifies for Shingles Vaccine? Yes   Zostavax completed Yes   Shingrix Completed?: Yes  Screening Tests Health Maintenance  Topic Date Due   COVID-19 Vaccine (4 - Pfizer series) 03/09/2020   INFLUENZA VACCINE  10/30/2021   TETANUS/TDAP  12/19/2029   Pneumonia Vaccine 90+ Years old  Completed   DEXA SCAN  Completed   Zoster Vaccines- Shingrix  Completed   HPV VACCINES  Aged Out    Health Maintenance  Health Maintenance Due  Topic Date Due   COVID-19 Vaccine (4 - Pfizer series) 03/09/2020   INFLUENZA VACCINE  10/30/2021    Colorectal cancer screening: No longer required.   Mammogram status: Completed 12/25/2021. Repeat every year (scheduled with OB/GYN)  Bone Density status: Completed 07/08/2014. Results reflect: Bone density results: NORMAL. Repeat every 5 years. (Patient refused)  Lung Cancer Screening: (Low Dose CT Chest recommended if Age 73-80 years, 30 pack-year currently smoking OR have quit w/in 15years.) does not qualify.   Lung Cancer Screening Referral: no  Additional Screening:  Hepatitis C Screening: does not qualify; Completed no  Vision Screening: Recommended annual ophthalmology  exams for early detection of glaucoma and other disorders of the eye. Is the patient up to date with their annual eye exam?  Yes  Who is the provider or what is the name of the office in which the patient attends annual eye exams? Clent Jacks, MD. If pt is not established with a provider, would they like to be referred to a provider to establish care? No .   Dental Screening: Recommended annual dental exams for proper oral hygiene  Community Resource Referral / Chronic Care Management: CRR required this visit?  No   CCM required this visit?  No      Plan:     I have personally reviewed and noted the following in the patient's chart:   Medical and social history Use of alcohol, tobacco or illicit drugs  Current medications and supplements including opioid prescriptions. Patient is not currently taking opioid prescriptions. Functional ability and status Nutritional status Physical activity Advanced directives List of other physicians Hospitalizations, surgeries, and ER visits in previous 12 months Vitals Screenings to include cognitive, depression, and falls Referrals and appointments  In addition, I have reviewed and discussed with patient certain preventive protocols, quality metrics, and best practice recommendations. A written personalized care plan for preventive services as well as general preventive health recommendations were provided to patient.     Sheral Flow, LPN   [id number]/[id number]   Nurse Notes:  Patient is cogitatively intact. There were no vitals filed for this visit. There is no height or weight on file to calculate BMI. Patient  stated that she has no issues with gait or balance; does not use any assistive devices.

## 2021-12-13 NOTE — Patient Instructions (Signed)
[name] , Thank you for taking time to come for your Medicare Wellness Visit. I appreciate your ongoing commitment to your health goals. Please review the following plan we discussed and let me know if I can assist you in the future.   Screening recommendations/referrals: Colonoscopy: No longer recommended due to age 81: Scheduled for 12/25/2021 with OB/GYN Bone Density: 07/08/2014; due every 5 years; patient declined further testing at this time. Recommended yearly ophthalmology/optometry visit for glaucoma screening and checkup Recommended yearly dental visit for hygiene and checkup  Vaccinations: Influenza vaccine: due Fall 2023 Pneumococcal vaccine: 02/14/2009, 03/15/2014 Tdap vaccine: 12/20/2019, due every 10 years Shingles vaccine: 05/07/2018, 09/23/2018 Covid-19:04/20/2019, 05/11/2019, 01/13/2020  Advanced directives: Yes  Conditions/risks identified: Yes  Next appointment: Follow up in one year for your annual wellness visit.   Preventive Care 36 Years and Older, Female Preventive care refers to lifestyle choices and visits with your health care provider that can promote health and wellness. What does preventive care include? A yearly physical exam. This is also called an annual well check. Dental exams once or twice a year. Routine eye exams. Ask your health care provider how often you should have your eyes checked. Personal lifestyle choices, including: Daily care of your teeth and gums. Regular physical activity. Eating a healthy diet. Avoiding tobacco and drug use. Limiting alcohol use. Practicing safe sex. Taking low-dose aspirin every day. Taking vitamin and mineral supplements as recommended by your health care provider. What happens during an annual well check? The services and screenings done by your health care provider during your annual well check will depend on your age, overall health, lifestyle risk factors, and family history of disease. Counseling   Your health care provider may ask you questions about your: Alcohol use. Tobacco use. Drug use. Emotional well-being. Home and relationship well-being. Sexual activity. Eating habits. History of falls. Memory and ability to understand (cognition). Work and work Statistician. Reproductive health. Screening  You may have the following tests or measurements: Height, weight, and BMI. Blood pressure. Lipid and cholesterol levels. These may be checked every 5 years, or more frequently if you are over 25 years old. Skin check. Lung cancer screening. You may have this screening every year starting at age 102 if you have a 30-pack-year history of smoking and currently smoke or have quit within the past 15 years. Fecal occult blood test (FOBT) of the stool. You may have this test every year starting at age 25. Flexible sigmoidoscopy or colonoscopy. You may have a sigmoidoscopy every 5 years or a colonoscopy every 10 years starting at age 34. Hepatitis C blood test. Hepatitis B blood test. Sexually transmitted disease (STD) testing. Diabetes screening. This is done by checking your blood sugar (glucose) after you have not eaten for a while (fasting). You may have this done every 1-3 years. Bone density scan. This is done to screen for osteoporosis. You may have this done starting at age 52. Mammogram. This may be done every 1-2 years. Talk to your health care provider about how often you should have regular mammograms. Talk with your health care provider about your test results, treatment options, and if necessary, the need for more tests. Vaccines  Your health care provider may recommend certain vaccines, such as: Influenza vaccine. This is recommended every year. Tetanus, diphtheria, and acellular pertussis (Tdap, Td) vaccine. You may need a Td booster every 10 years. Zoster vaccine. You may need this after age 53. Pneumococcal 13-valent conjugate (PCV13) vaccine. One dose is  recommended  after age 62. Pneumococcal polysaccharide (PPSV23) vaccine. One dose is recommended after age 36. Talk to your health care provider about which screenings and vaccines you need and how often you need them. This information is not intended to replace advice given to you by your health care provider. Make sure you discuss any questions you have with your health care provider. Document Released: 04/14/2015 Document Revised: 12/06/2015 Document Reviewed: 01/17/2015 Elsevier Interactive Patient Education  2017 [employer] Prevention in the Home Falls can cause injuries. They can happen to people of all ages. There are many things you can do to make your home safe and to help prevent falls. What can I do on the outside of my home? Regularly fix the edges of walkways and driveways and fix any cracks. Remove anything that might make you trip as you walk through a door, such as a raised step or threshold. Trim any bushes or trees on the path to your home. Use bright outdoor lighting. Clear any walking paths of anything that might make someone trip, such as rocks or tools. Regularly check to see if handrails are loose or broken. Make sure that both sides of any steps have handrails. Any raised decks and porches should have guardrails on the edges. Have any leaves, snow, or ice cleared regularly. Use sand or salt on walking paths during winter. Clean up any spills in your garage right away. This includes oil or grease spills. What can I do in the bathroom? Use night lights. Install grab bars by the toilet and in the tub and shower. Do not use towel bars as grab bars. Use non-skid mats or decals in the tub or shower. If you need to sit down in the shower, use a plastic, non-slip stool. Keep the floor dry. Clean up any water that spills on the floor as soon as it happens. Remove soap buildup in the tub or shower regularly. Attach bath mats securely with double-sided non-slip rug tape. Do not  have throw rugs and other things on the floor that can make you trip. What can I do in the bedroom? Use night lights. Make sure that you have a light by your bed that is easy to reach. Do not use any sheets or blankets that are too big for your bed. They should not hang down onto the floor. Have a firm chair that has side arms. You can use this for support while you get dressed. Do not have throw rugs and other things on the floor that can make you trip. What can I do in the kitchen? Clean up any spills right away. Avoid walking on wet floors. Keep items that you use a lot in easy-to-reach places. If you need to reach something above you, use a strong step stool that has a grab bar. Keep electrical cords out of the way. Do not use floor polish or wax that makes floors slippery. If you must use wax, use non-skid floor wax. Do not have throw rugs and other things on the floor that can make you trip. What can I do with my stairs? Do not leave any items on the stairs. Make sure that there are handrails on both sides of the stairs and use them. Fix handrails that are broken or loose. Make sure that handrails are as long as the stairways. Check any carpeting to make sure that it is firmly attached to the stairs. Fix any carpet that is loose or worn. Avoid having  throw rugs at the top or bottom of the stairs. If you do have throw rugs, attach them to the floor with carpet tape. Make sure that you have a light switch at the top of the stairs and the bottom of the stairs. If you do not have them, ask someone to add them for you. What else can I do to help prevent falls? Wear shoes that: Do not have high heels. Have rubber bottoms. Are comfortable and fit you well. Are closed at the toe. Do not wear sandals. If you use a stepladder: Make sure that it is fully opened. Do not climb a closed stepladder. Make sure that both sides of the stepladder are locked into place. Ask someone to hold it for  you, if possible. Clearly mark and make sure that you can see: Any grab bars or handrails. First and last steps. Where the edge of each step is. Use tools that help you move around (mobility aids) if they are needed. These include: Canes. Walkers. Scooters. Crutches. Turn on the lights when you go into a dark area. Replace any light bulbs as soon as they burn out. Set up your furniture so you have a clear path. Avoid moving your furniture around. If any of your floors are uneven, fix them. If there are any pets around you, be aware of where they are. Review your medicines with your doctor. Some medicines can make you feel dizzy. This can increase your chance of falling. Ask your doctor what other things that you can do to help prevent falls. This information is not intended to replace advice given to you by your health care provider. Make sure you discuss any questions you have with your health care provider. Document Released: 01/12/2009 Document Revised: 08/24/2015 Document Reviewed: 04/22/2014 Elsevier Interactive Patient Education  2017 [employer].

## 2021-12-24 ENCOUNTER — Ambulatory Visit (INDEPENDENT_AMBULATORY_CARE_PROVIDER_SITE_OTHER): Payer: Medicare Other | Admitting: Emergency Medicine

## 2021-12-24 ENCOUNTER — Encounter: Payer: Self-pay | Admitting: Emergency Medicine

## 2021-12-24 VITALS — BP 138/86 | HR 53 | Temp 98.0°F | Ht 59.0 in | Wt 98.0 lb

## 2021-12-24 DIAGNOSIS — I10 Essential (primary) hypertension: Secondary | ICD-10-CM | POA: Diagnosis not present

## 2021-12-24 DIAGNOSIS — Z13220 Encounter for screening for lipoid disorders: Secondary | ICD-10-CM

## 2021-12-24 DIAGNOSIS — J309 Allergic rhinitis, unspecified: Secondary | ICD-10-CM

## 2021-12-24 DIAGNOSIS — Z1329 Encounter for screening for other suspected endocrine disorder: Secondary | ICD-10-CM

## 2021-12-24 DIAGNOSIS — F411 Generalized anxiety disorder: Secondary | ICD-10-CM | POA: Diagnosis not present

## 2021-12-24 DIAGNOSIS — Z0000 Encounter for general adult medical examination without abnormal findings: Secondary | ICD-10-CM | POA: Diagnosis not present

## 2021-12-24 DIAGNOSIS — Z23 Encounter for immunization: Secondary | ICD-10-CM | POA: Diagnosis not present

## 2021-12-24 DIAGNOSIS — Z130 Encounter for screening for diseases of the blood and blood-forming organs and certain disorders involving the immune mechanism: Secondary | ICD-10-CM | POA: Diagnosis not present

## 2021-12-24 DIAGNOSIS — Z13228 Encounter for screening for other metabolic disorders: Secondary | ICD-10-CM

## 2021-12-24 DIAGNOSIS — E782 Mixed hyperlipidemia: Secondary | ICD-10-CM

## 2021-12-24 LAB — LIPID PANEL
Cholesterol: 154 mg/dL (ref 0–200)
HDL: 46.2 mg/dL (ref >39.00)
NonHDL: 107.54
Total CHOL/HDL Ratio: 3
Triglycerides: 261 mg/dL — ABNORMAL HIGH (ref 0.0–149.0)
VLDL: 52.2 mg/dL — ABNORMAL HIGH (ref 0.0–40.0)

## 2021-12-24 LAB — CBC WITH DIFFERENTIAL/PLATELET
Basophils Absolute: 0.1 10*3/uL (ref 0.0–0.1)
Basophils Relative: 1.2 % (ref 0.0–3.0)
Eosinophils Absolute: 0.3 10*3/uL (ref 0.0–0.7)
Eosinophils Relative: 4.2 % (ref 0.0–5.0)
HCT: 42.2 % (ref 36.0–46.0)
Hemoglobin: 14.1 g/dL (ref 12.0–15.0)
Lymphocytes Relative: 17.4 % (ref 12.0–46.0)
Lymphs Abs: 1.3 10*3/uL (ref 0.7–4.0)
MCHC: 33.4 g/dL (ref 30.0–36.0)
MCV: 92.4 fl (ref 78.0–100.0)
Monocytes Absolute: 0.8 10*3/uL (ref 0.1–1.0)
Monocytes Relative: 10.2 % (ref 3.0–12.0)
Neutro Abs: 5.2 10*3/uL (ref 1.4–7.7)
Neutrophils Relative %: 67 % (ref 43.0–77.0)
Platelets: 181 10*3/uL (ref 150.0–400.0)
RBC: 4.57 Mil/uL (ref 3.87–5.11)
RDW: 14 % (ref 11.5–15.5)
WBC: 7.7 10*3/uL (ref 4.0–10.5)

## 2021-12-24 LAB — COMPREHENSIVE METABOLIC PANEL
ALT: 39 U/L — ABNORMAL HIGH (ref 0–35)
AST: 31 U/L (ref 0–37)
Albumin: 4.3 g/dL (ref 3.5–5.2)
Alkaline Phosphatase: 88 U/L (ref 39–117)
BUN: 23 mg/dL (ref 6–23)
CO2: 29 mEq/L (ref 19–32)
Calcium: 9.8 mg/dL (ref 8.4–10.5)
Chloride: 105 mEq/L (ref 96–112)
Creatinine, Ser: 1.2 mg/dL (ref 0.40–1.20)
GFR: 42.72 mL/min — ABNORMAL LOW (ref >60.00)
Glucose, Bld: 96 mg/dL (ref 70–99)
Potassium: 4.6 mEq/L (ref 3.5–5.1)
Sodium: 140 mEq/L (ref 135–145)
Total Bilirubin: 0.4 mg/dL (ref 0.2–1.2)
Total Protein: 7.4 g/dL (ref 6.0–8.3)

## 2021-12-24 LAB — LDL CHOLESTEROL, DIRECT: Direct LDL: 22 mg/dL

## 2021-12-24 LAB — HEMOGLOBIN A1C: Hgb A1c MFr Bld: 6 % (ref 4.6–6.5)

## 2021-12-24 MED ORDER — ACEBUTOLOL HCL 200 MG PO CAPS: 200.0000 mg | ORAL_CAPSULE | Freq: Every day | ORAL | 3 refills | Status: DC

## 2021-12-24 MED ORDER — AMLODIPINE BESYLATE 2.5 MG PO TABS: ORAL_TABLET | ORAL | 3 refills | Status: DC

## 2021-12-24 MED ORDER — ROSUVASTATIN CALCIUM 5 MG PO TABS: 2.5000 mg | ORAL_TABLET | Freq: Every day | ORAL | 3 refills | Status: DC

## 2021-12-24 MED ORDER — LORAZEPAM 0.5 MG PO TABS: 0.5000 mg | ORAL_TABLET | Freq: Two times a day (BID) | ORAL | 1 refills | Status: DC | PRN

## 2021-12-24 MED ORDER — DESLORATADINE 5 MG PO TABS: 5.0000 mg | ORAL_TABLET | Freq: Every day | ORAL | 3 refills | Status: DC

## 2021-12-24 NOTE — Patient Instructions (Signed)

## 2021-12-24 NOTE — Progress Notes (Signed)
Stephanie Aguilar 81 y.o.   Chief Complaint  Patient presents with   Annual Exam    Pt wants to discuss the RSV vaccine     HISTORY OF PRESENT ILLNESS: This is a 81 y.o. female A1A here for annual exam. Has no complaints or medical concerns today. Eating well and staying physically active.  HPI   Prior to Admission medications   Medication Sig Start Date End Date Taking? Authorizing Provider  amLODipine (NORVASC) 2.5 MG tablet TAKE 1 TABLET EACH DAY. 12/21/20  Yes Nykiah Ma, Ines Bloomer, MD  aspirin 81 MG tablet Take 81 mg by mouth daily.   Yes [provider]  Cholecalciferol (VITAMIN D) 2000 units CAPS Take 1 capsule by mouth daily.   Yes [provider]  clobetasol (TEMOVATE) 0.05 % external solution Apply 10 drops to scalp every night at bedtime. 04/11/15  Yes Darlyne Russian, MD  cycloSPORINE (RESTASIS) 0.05 % ophthalmic emulsion 1 drop 2 (two) times daily.   Yes [provider]  desloratadine (CLARINEX) 5 MG tablet Take 1 tablet (5 mg total) by mouth daily. as directed 12/21/20  Yes Tarvares Lant, Ines Bloomer, MD  fluticasone [facility]) 50 MCG/ACT nasal spray USE 2 SPRAYS INTO EACH NOSTRIL DAILY 09/20/14  Yes Daub, Loura Back, MD  LORazepam (ATIVAN) 0.5 MG tablet Take 1 tablet (0.5 mg total) by mouth 2 (two) times daily as needed for anxiety. 08/30/21  Yes Harbor Paster, Ines Bloomer, MD  METRONIDAZOLE, TOPICAL, 0.75 % LOTN APPLY TOPICALLY TO AFFECTED AREA. 09/21/15  Yes Daub, Loura Back, MD  olmesartan (BENICAR) 40 MG tablet TAKE 1 TABLET ONCE DAILY. 02/05/21  Yes Curby Carswell, Ines Bloomer, MD  OVER THE COUNTER MEDICATION OTC Imodium taking daily for diarrrhea   Yes [provider]  VIVELLE-DOT 0.0375 MG/24HR  09/05/14  Yes [provider]  acebutolol (SECTRAL) 200 MG capsule Take 1 capsule (200 mg total) by mouth daily. 12/21/20 03/21/21  Horald Pollen, MD  rosuvastatin (CRESTOR) 5 MG tablet Take 0.5 tablets (2.5 mg total) by mouth daily. 12/21/20  03/21/21  Horald Pollen, MD    Allergies  Allergen Reactions   Codeine    Erythromycin     Sick on stomach   Latex    Lisinopril    Sulfa Antibiotics     rash    Patient Active Problem List   Diagnosis Date Noted   CKD (chronic kidney disease), stage III ([place]) 04/13/2016   PAD (peripheral artery disease) ([place]) 04/12/2016   Anxiety disorder 11/07/2015   Carotid bruit 07/06/2013   Essential hypertension 02/04/2012   Hyperlipidemia 02/04/2012   Menopausal syndrome on hormone replacement therapy 02/04/2012    Past Medical History:  Diagnosis Date   Allergy    Anxiety    Cataract    Depression    Hyperlipidemia    Hypertension     Past Surgical History:  Procedure Laterality Date   ABDOMINAL HYSTERECTOMY     APPENDECTOMY     BREAST SURGERY     DENTAL SURGERY     TONSILLECTOMY AND ADENOIDECTOMY      Social History   Socioeconomic History   Marital status: Married    Spouse name: Not on file   Number of children: Not on file   Years of education: Not on file   Highest education level: Not on file  Occupational History   Occupation: retired/homemaker  Tobacco Use   Smoking status: Never   Smokeless tobacco: Never  Substance and Sexual Activity   Alcohol  use: Yes    Comment: 1-2 oz weekly   Drug use: No   Sexual activity: Never  Other Topics Concern   Not on file  Social History Narrative   Not on file   Social Determinants of Health   Financial Resource Strain: Low Risk  (12/13/2021)   Overall Financial Resource Strain (CARDIA)    Difficulty of Paying Living Expenses: Not hard at all  Food Insecurity: No Food Insecurity (12/13/2021)   Hunger Vital Sign    Worried About Running Out of Food in the Last Year: Never true    Ran Out of Food in the Last Year: Never true  Transportation Needs: No Transportation Needs (12/13/2021)   PRAPARE - Hydrologist (Medical): No    Lack of Transportation (Non-Medical): No   Physical Activity: Sufficiently Active (12/13/2021)   Exercise Vital Sign    Days of Exercise per Week: 7 days    Minutes of Exercise per Session: 30 min  Stress: No Stress Concern Present (12/13/2021)   [place]    Feeling of Stress : Not at all  Social Connections: Sun (12/13/2021)   Social Connection and Isolation Panel [NHANES]    Frequency of Communication with Friends and Family: More than three times a week    Frequency of Social Gatherings with Friends and Family: More than three times a week    Attends Religious Services: More than 4 times per year    Active Member of [employer] or Organizations: Yes    Attends Music therapist: More than 4 times per year    Marital Status: Married  Human resources officer Violence: Not At Risk (12/13/2021)   Humiliation, Afraid, Rape, and Kick questionnaire    Fear of Current or Ex-Partner: No    Emotionally Abused: No    Physically Abused: No    Sexually Abused: No    Family History  Problem Relation Age of Onset   Hypertension Mother    Alcohol abuse Father    COPD Brother    Hypertension Brother    Alcohol abuse Brother    Heart disease Maternal Grandmother    Cancer Maternal Grandfather    Stroke Paternal Grandmother    Gout Brother    Alcohol abuse Brother      Review of Systems  Constitutional: Negative.  Negative for chills and fever.  HENT: Negative.  Negative for congestion and sore throat.   Respiratory: Negative.  Negative for cough and shortness of breath.   Cardiovascular: Negative.  Negative for chest pain.  Gastrointestinal:  Negative for abdominal pain, diarrhea, nausea and vomiting.  Genitourinary: Negative.   Skin: Negative.  Negative for rash.  Neurological:  Negative for dizziness and headaches.  All other systems reviewed and are negative.  Today's Vitals   12/24/21 0902  BP: 138/86  Pulse: (!) 53  Temp: 98 F (36.7  C)  TempSrc: Oral  SpO2: 99%  Weight: 98 lb (44.5 kg)  Height: '4\' 11"'$  (1.499 m)   Body mass index is 19.79 kg/m.  Physical Exam Vitals reviewed.  Constitutional:      Appearance: Normal appearance.  HENT:     Head: Normocephalic.     Right Ear: Tympanic membrane, ear canal and external ear normal.     Left Ear: Tympanic membrane, ear canal and external ear normal.     Mouth/Throat:     Mouth: Mucous membranes are moist.  Pharynx: Oropharynx is clear.  Eyes:     Extraocular Movements: Extraocular movements intact.     Conjunctiva/sclera: Conjunctivae normal.     Pupils: Pupils are equal, round, and reactive to light.  Cardiovascular:     Rate and Rhythm: Normal rate and regular rhythm.     Pulses: Normal pulses.     Heart sounds: Normal heart sounds.  Pulmonary:     Effort: Pulmonary effort is normal.     Breath sounds: Normal breath sounds.  Abdominal:     General: There is no distension.     Palpations: Abdomen is soft. There is no mass.     Tenderness: There is no abdominal tenderness. There is no guarding.  Musculoskeletal:     Cervical back: No tenderness.     Right lower leg: No edema.     Left lower leg: No edema.  Lymphadenopathy:     Cervical: No cervical adenopathy.  Skin:    General: Skin is warm and dry.     Capillary Refill: Capillary refill takes less than 2 seconds.  Neurological:     General: No focal deficit present.     Mental Status: She is alert and oriented to person, place, and time.  Psychiatric:        Mood and Affect: Mood normal.        Behavior: Behavior normal.    ASSESSMENT & PLAN: Problem List Items Addressed This Visit       Other   Hyperlipidemia   Relevant Medications   amLODipine (NORVASC) 2.5 MG tablet   acebutolol (SECTRAL) 200 MG capsule   rosuvastatin (CRESTOR) 5 MG tablet   Anxiety disorder   Relevant Medications   LORazepam (ATIVAN) 0.5 MG tablet   Other Visit Diagnoses     Routine general medical  examination at a health care facility    -  Primary   Essential hypertension, benign       Relevant Medications   amLODipine (NORVASC) 2.5 MG tablet   acebutolol (SECTRAL) 200 MG capsule   rosuvastatin (CRESTOR) 5 MG tablet   Other Relevant Orders   Comprehensive metabolic panel   Lipid panel   Allergic rhinitis, unspecified seasonality, unspecified trigger       Relevant Medications   desloratadine (CLARINEX) 5 MG tablet   Screening for deficiency anemia       Relevant Orders   CBC with Differential   Screening for lipoid disorders       Relevant Orders   Lipid panel   Screening for endocrine, metabolic and immunity disorder       Relevant Orders   Comprehensive metabolic panel   Hemoglobin A1c   Need for vaccination       Relevant Orders   Flu Vaccine QUAD High Dose(Fluad) (Completed)      Modifiable risk factors discussed with patient. Anticipatory guidance according to age provided. The following topics were also discussed: Social Determinants of Health Smoking.  Non-smoker Diet and nutrition.  Good eating habits Benefits of exercise.  Stays physically active Cancer screening and review of most recent mammogram and colonoscopy reports Vaccinations recommendations Cardiovascular risk assessment Review of multiple chronic medical problems and their management Review of all medications Mental health including depression and anxiety Fall and accident prevention  Patient Instructions  Health Maintenance, Female Adopting a healthy lifestyle and getting preventive care are important in promoting health and wellness. Ask your health care provider about: The right schedule for you to have regular tests and exams. Things  you can do on your own to prevent diseases and keep yourself healthy. What should I know about diet, weight, and exercise? Eat a healthy diet  Eat a diet that includes plenty of vegetables, fruits, low-fat dairy products, and lean protein. Do not eat a  lot of foods that are high in solid fats, added sugars, or sodium. Maintain a healthy weight Body mass index (BMI) is used to identify weight problems. It estimates body fat based on height and weight. Your health care provider can help determine your BMI and help you achieve or maintain a healthy weight. Get regular exercise Get regular exercise. This is one of the most important things you can do for your health. Most adults should: Exercise for at least 150 minutes each week. The exercise should increase your heart rate and make you sweat (moderate-intensity exercise). Do strengthening exercises at least twice a week. This is in addition to the moderate-intensity exercise. Spend less time sitting. Even light physical activity can be beneficial. Watch cholesterol and blood lipids Have your blood tested for lipids and cholesterol at 80 years of age, then have this test every 5 years. Have your cholesterol levels checked more often if: Your lipid or cholesterol levels are high. You are older than 80 years of age. You are at high risk for heart disease. What should I know about cancer screening? Depending on your health history and family history, you may need to have cancer screening at various ages. This may include screening for: Breast cancer. Cervical cancer. Colorectal cancer. Skin cancer. Lung cancer. What should I know about heart disease, diabetes, and high blood pressure? Blood pressure and heart disease High blood pressure causes heart disease and increases the risk of stroke. This is more likely to develop in people who have high blood pressure readings or are overweight. Have your blood pressure checked: Every 3-5 years if you are 40-61 years of age. Every year if you are 1 years old or older. Diabetes Have regular diabetes screenings. This checks your fasting blood sugar level. Have the screening done: Once every three years after age 2 if you are at a normal weight and  have a low risk for diabetes. More often and at a younger age if you are overweight or have a high risk for diabetes. What should I know about preventing infection? Hepatitis B If you have a higher risk for hepatitis B, you should be screened for this virus. Talk with your health care provider to find out if you are at risk for hepatitis B infection. Hepatitis C Testing is recommended for: Everyone born from [birth year] through 1964. Anyone with known risk factors for hepatitis C. Sexually transmitted infections (STIs) Get screened for STIs, including gonorrhea and chlamydia, if: You are sexually active and are younger than 80 years of age. You are older than 80 years of age and your health care provider tells you that you are at risk for this type of infection. Your sexual activity has changed since you were last screened, and you are at increased risk for chlamydia or gonorrhea. Ask your health care provider if you are at risk. Ask your health care provider about whether you are at high risk for HIV. Your health care provider may recommend a prescription medicine to help prevent HIV infection. If you choose to take medicine to prevent HIV, you should first get tested for HIV. You should then be tested every 3 months for as long as you are taking the medicine. Pregnancy  If you are about to stop having your period (premenopausal) and you may become pregnant, seek counseling before you get pregnant. Take 400 to 800 micrograms (mcg) of folic acid every day if you become pregnant. Ask for birth control (contraception) if you want to prevent pregnancy. Osteoporosis and menopause Osteoporosis is a disease in which the bones lose minerals and strength with aging. This can result in bone fractures. If you are 96 years old or older, or if you are at risk for osteoporosis and fractures, ask your health care provider if you should: Be screened for bone loss. Take a calcium or vitamin D supplement to lower your  risk of fractures. Be given hormone replacement therapy (HRT) to treat symptoms of menopause. Follow these instructions at home: Alcohol use Do not drink alcohol if: Your health care provider tells you not to drink. You are pregnant, may be pregnant, or are planning to become pregnant. If you drink alcohol: Limit how much you have to: 0-1 drink a day. Know how much alcohol is in your drink. In the [place] one drink equals one 12 oz bottle of beer (355 mL), one 5 oz glass of wine (148 mL), or one 1 oz glass of hard liquor (44 mL). Lifestyle Do not use any products that contain nicotine or tobacco. These products include cigarettes, chewing tobacco, and vaping devices, such as e-cigarettes. If you need help quitting, ask your health care provider. Do not use street drugs. Do not share needles. Ask your health care provider for help if you need support or information about quitting drugs. General instructions Schedule regular health, dental, and eye exams. Stay current with your vaccines. Tell your health care provider if: You often feel depressed. You have ever been abused or do not feel safe at home. Summary Adopting a healthy lifestyle and getting preventive care are important in promoting health and wellness. Follow your health care provider's instructions about healthy diet, exercising, and getting tested or screened for diseases. Follow your health care provider's instructions on monitoring your cholesterol and blood pressure. This information is not intended to replace advice given to you by your health care provider. Make sure you discuss any questions you have with your health care provider. Document Revised: 08/07/2020 Document Reviewed: 08/07/2020 Elsevier Patient Education  [place], MD [facility]

## 2022-01-21 LAB — HM DEXA SCAN

## 2022-02-01 ENCOUNTER — Other Ambulatory Visit: Payer: Self-pay | Admitting: Emergency Medicine

## 2022-02-01 DIAGNOSIS — I10 Essential (primary) hypertension: Secondary | ICD-10-CM

## 2022-05-19 ENCOUNTER — Emergency Department (HOSPITAL_BASED_OUTPATIENT_CLINIC_OR_DEPARTMENT_OTHER)
Admission: EM | Admit: 2022-05-19 | Discharge: 2022-05-20 | Disposition: A | Discharge status: Home / Self Care | Payer: Medicare Other | Attending: Emergency Medicine | Admitting: Emergency Medicine

## 2022-05-19 ENCOUNTER — Emergency Department (HOSPITAL_BASED_OUTPATIENT_CLINIC_OR_DEPARTMENT_OTHER): Payer: Medicare Other

## 2022-05-19 ENCOUNTER — Encounter (HOSPITAL_BASED_OUTPATIENT_CLINIC_OR_DEPARTMENT_OTHER): Payer: Self-pay

## 2022-05-19 DIAGNOSIS — R079 Chest pain, unspecified: Secondary | ICD-10-CM

## 2022-05-19 DIAGNOSIS — Z7982 Long term (current) use of aspirin: Secondary | ICD-10-CM | POA: Diagnosis not present

## 2022-05-19 DIAGNOSIS — I10 Essential (primary) hypertension: Secondary | ICD-10-CM | POA: Insufficient documentation

## 2022-05-19 DIAGNOSIS — Z79899 Other long term (current) drug therapy: Secondary | ICD-10-CM | POA: Diagnosis not present

## 2022-05-19 LAB — CBC
HCT: 42.2 % (ref 36.0–46.0)
Hemoglobin: 14 g/dL (ref 12.0–15.0)
MCH: 30.3 pg (ref 26.0–34.0)
MCHC: 33.2 g/dL (ref 30.0–36.0)
MCV: 91.3 fL (ref 80.0–100.0)
Platelets: 175 10*3/uL (ref 150–400)
RBC: 4.62 MIL/uL (ref 3.87–5.11)
RDW: 13.4 % (ref 11.5–15.5)
WBC: 10.8 10*3/uL — ABNORMAL HIGH (ref 4.0–10.5)
nRBC: 0 % (ref 0.0–0.2)

## 2022-05-19 LAB — TROPONIN I (HIGH SENSITIVITY): Troponin I (High Sensitivity): 4 ng/L (ref <18)

## 2022-05-19 LAB — BASIC METABOLIC PANEL
Anion gap: 9 (ref 5–15)
BUN: 24 mg/dL — ABNORMAL HIGH (ref 8–23)
CO2: 26 mmol/L (ref 22–32)
Calcium: 9.8 mg/dL (ref 8.9–10.3)
Chloride: 105 mmol/L (ref 98–111)
Creatinine, Ser: 1.08 mg/dL — ABNORMAL HIGH (ref 0.44–1.00)
GFR, Estimated: 52 mL/min — ABNORMAL LOW (ref >60)
Glucose, Bld: 129 mg/dL — ABNORMAL HIGH (ref 70–99)
Potassium: 3.5 mmol/L (ref 3.5–5.1)
Sodium: 140 mmol/L (ref 135–145)

## 2022-05-19 LAB — D-DIMER, QUANTITATIVE: D-Dimer, Quant: 0.27 ug{FEU}/mL (ref 0.00–0.50)

## 2022-05-19 LAB — HEPATIC FUNCTION PANEL
ALT: 22 U/L (ref 0–44)
AST: 21 U/L (ref 15–41)
Albumin: 4.3 g/dL (ref 3.5–5.0)
Alkaline Phosphatase: 89 U/L (ref 38–126)
Bilirubin, Direct: 0.1 mg/dL (ref 0.0–0.2)
Indirect Bilirubin: 0.2 mg/dL — ABNORMAL LOW (ref 0.3–0.9)
Total Bilirubin: 0.3 mg/dL (ref 0.3–1.2)
Total Protein: 7.1 g/dL (ref 6.5–8.1)

## 2022-05-19 LAB — LIPASE, BLOOD: Lipase: 18 U/L (ref 11–51)

## 2022-05-19 MED ORDER — ALUM & MAG HYDROXIDE-SIMETH 200-200-20 MG/5ML PO SUSP
30.0000 mL | Freq: Once | ORAL | Status: AC
  Administered 2022-05-19: 30 mL via ORAL
  Filled 2022-05-19: qty 30

## 2022-05-19 NOTE — ED Triage Notes (Signed)
Pt c/o HTN, states she's taken BP through the evening to ensure, c/o "vague- not sharp- L sided back, shoulder & chest" pain & some fatigue- "but it's been a busy weekend." Denies NV, no SHOB  Hx HTN, compliant w medications.

## 2022-05-19 NOTE — ED Provider Notes (Signed)
[facility] Provider Note   CSN: [id number] Arrival date & time: 05/19/22  2051     History  Chief Complaint  Patient presents with   Hypertension        Chest Pain    Stephanie Aguilar is a 82 y.o. female.  79 yo F with a chief complaint of left-sided chest pain.  This been going on today.  Nothing seems to make it better or worse.  No obvious difficulty breathing radiates into the arm sometimes.  It was still persistent at dinner and so she talked with her family and decided to come in for evaluation.  She has been able to exercise since the past couple days preceding without issue.  She denies cough congestion or fever.  Denies trauma.  Patient denies history of MI, denies  diabetes or smoking.  Denies family history of MI. Hx of HTN, HLD  Patient denies history of PE or DVT denies hemoptysis denies unilateral lower extremity edema denies recent surgery immobilization hospitalization or history of cancer. She is on estrogen status post oophorectomy    Hypertension Associated symptoms include chest pain.  Chest Pain      Home Medications Prior to Admission medications   Medication Sig Start Date End Date Taking? Authorizing Provider  acebutolol (SECTRAL) 200 MG capsule Take 1 capsule (200 mg total) by mouth daily. 12/24/21 12/19/22  Horald Pollen, MD  amLODipine (NORVASC) 2.5 MG tablet TAKE 1 TABLET EACH DAY. 12/24/21   Horald Pollen, MD  aspirin 81 MG tablet Take 81 mg by mouth daily.    [provider]  Cholecalciferol (VITAMIN D) 2000 units CAPS Take 1 capsule by mouth daily.    [provider]  clobetasol (TEMOVATE) 0.05 % external solution Apply 10 drops to scalp every night at bedtime. 04/11/15   Darlyne Russian, MD  cycloSPORINE (RESTASIS) 0.05 % ophthalmic emulsion 1 drop 2 (two) times daily.    [provider]  desloratadine (CLARINEX) 5 MG tablet Take 1 tablet (5 mg total)  by mouth daily. as directed 12/24/21   Horald Pollen, MD  fluticasone [facility]) 50 MCG/ACT nasal spray USE 2 SPRAYS INTO EACH NOSTRIL DAILY 09/20/14   Darlyne Russian, MD  LORazepam (ATIVAN) 0.5 MG tablet Take 1 tablet (0.5 mg total) by mouth 2 (two) times daily as needed for anxiety. 12/24/21   Horald Pollen, MD  METRONIDAZOLE, TOPICAL, 0.75 % LOTN APPLY TOPICALLY TO AFFECTED AREA. 09/21/15   Darlyne Russian, MD  olmesartan (BENICAR) 40 MG tablet TAKE 1 TABLET ONCE DAILY. 02/04/22   Horald Pollen, MD  OVER THE COUNTER MEDICATION OTC Imodium taking daily for diarrrhea    [provider]  rosuvastatin (CRESTOR) 5 MG tablet Take 0.5 tablets (2.5 mg total) by mouth daily. 12/24/21 12/19/22  Horald Pollen, MD  VIVELLE-DOT 0.0375 MG/24HR  09/05/14   [provider]      Allergies    Codeine, Erythromycin, Latex, Lisinopril, and Sulfa antibiotics    Review of Systems   Review of Systems  Cardiovascular:  Positive for chest pain.    Physical Exam Updated Vital Signs BP (!) 150/74   Pulse (!) 58   Temp 98.4 F (36.9 C)   Resp (!) 21   Ht 4' 11"$  (1.499 m)   Wt 44 kg   SpO2 97%   BMI 19.59 kg/m  Physical Exam  ED Results / Procedures / Treatments   Labs (all  labs ordered are listed, but only abnormal results are displayed) Labs Reviewed  BASIC METABOLIC PANEL - Abnormal; Notable for the following components:      Result Value   Glucose, Bld 129 (*)    BUN 24 (*)    Creatinine, Ser 1.08 (*)    GFR, Estimated 52 (*)    All other components within normal limits  CBC - Abnormal; Notable for the following components:   WBC 10.8 (*)    All other components within normal limits  HEPATIC FUNCTION PANEL - Abnormal; Notable for the following components:   Indirect Bilirubin 0.2 (*)    All other components within normal limits  LIPASE, BLOOD  D-DIMER, QUANTITATIVE  TROPONIN I (HIGH SENSITIVITY)  TROPONIN I (HIGH SENSITIVITY)    EKG EKG  Interpretation  Date/Time:  Sunday May 19 2022 21:06:46 EST Ventricular Rate:  59 PR Interval:  166 QRS Duration: 74 QT Interval:  390 QTC Calculation: 386 R Axis:   61 Text Interpretation: Sinus bradycardia Possible Left atrial enlargement Septal infarct , age undetermined T wave abnormality, consider inferolateral ischemia Abnormal ECG No old tracing to compare Confirmed by Deno Etienne [phone]) on 05/19/2022 9:16:17 PM  Radiology DG Chest Port 1 View  Result Date: 05/19/2022 CLINICAL DATA:  Hypertension and left-sided chest pain, initial encounter EXAM: PORTABLE CHEST 1 VIEW COMPARISON:  12/12/2014 FINDINGS: Cardiac shadow is stable. Aortic calcifications are seen. The lungs are well aerated bilaterally. No focal infiltrate or effusion is seen. Diffuse costal cartilage calcifications are noted. No bony abnormality is seen. IMPRESSION: No acute abnormality noted. Electronically Signed   By: Inez Catalina M.D.   On: 05/19/2022 21:45    Procedures Procedures    Medications Ordered in ED Medications  alum & mag hydroxide-simeth (MAALOX/MYLANTA) 200-200-20 MG/5ML suspension 30 mL (30 mLs Oral Given 05/19/22 2203)    ED Course/ Medical Decision Making/ A&P                             Medical Decision Making Amount and/or Complexity of Data Reviewed Labs: ordered. Radiology: ordered. ECG/medicine tests: ordered.  Risk OTC drugs.   82 yo F with a chief complaint of chest discomfort.  Started this afternoon.  Nothing seems to make it better or worse.  Goes into the left shoulder and arm.  Not reproducible on exam.  EKG was nonischemic is independently interpreted by me.  She is chronically on estrogen status post total hysterectomy in 2 D-dimer was ordered.  This is negative.  First troponin is negative.  With recent onset we will obtain a delta.  LFTs and lipase are also unremarkable.  Patient care was signed out to Dr. Stark Jock, please see his note for further details care in the  ED.  The patients results and plan were reviewed and discussed.   Any x-rays performed were independently reviewed by myself.   Differential diagnosis were considered with the presenting HPI.  Medications  alum & mag hydroxide-simeth (MAALOX/MYLANTA) 200-200-20 MG/5ML suspension 30 mL (30 mLs Oral Given 05/19/22 2203)    Vitals:   05/19/22 2108 05/19/22 2145 05/19/22 2155 05/19/22 2200  BP: (!) 186/65 (!) 160/59  (!) 150/74  Pulse: (!) 58 (!) 59  (!) 58  Resp: 16 18  (!) 21  Temp: 98.4 F (36.9 C)     SpO2: 100% 97%  97%  Weight:   44 kg   Height:   4' 11"$  (1.499 m)  Final diagnoses:  Nonspecific chest pain           Final Clinical Impression(s) / ED Diagnoses Final diagnoses:  Nonspecific chest pain    Rx / DC Orders ED Discharge Orders     None         Deno Etienne, DO 05/19/22 2328

## 2022-05-20 LAB — TROPONIN I (HIGH SENSITIVITY): Troponin I (High Sensitivity): 5 ng/L (ref <18)

## 2022-05-20 NOTE — ED Notes (Signed)
Pt. Resting comfortably w/ eyes open; respirations even and unlabored; husband at bedside

## 2022-05-20 NOTE — Discharge Instructions (Signed)
Continue medications as previously prescribed.  Follow-up with primary doctor in the next few days, and return to the ER if symptoms significantly worsen or change.

## 2022-05-21 ENCOUNTER — Telehealth: Payer: Self-pay

## 2022-05-21 NOTE — Transitions of Care (Post Inpatient/ED Visit) (Signed)
   05/21/2022  Name: Stephanie Aguilar MRN: [id number] DOB: October 10, 1940  Today's TOC FU Call Status: Today's TOC FU Call Status:: Successful TOC FU Call Competed TOC FU Call Complete Date: 05/21/22  Transition Care Management Follow-up Telephone Call Date of Discharge: 05/20/22 Discharge Facility: [facility] ([facility]) Type of Discharge: Emergency Department Reason for ED Visit: Other: (chest pain) How have you been since you were released from the hospital?: Better Any questions or concerns?: No  Items Reviewed: Did you receive and understand the discharge instructions provided?: Yes Medications obtained and verified?: Yes (Medications Reviewed) Any new allergies since your discharge?: No Dietary orders reviewed?: NA Do you have support at home?: Yes People in Home: spouse  Home Care and Equipment/Supplies: [place] Ordered?: NA Any new equipment or medical supplies ordered?: NA  Functional Questionnaire: Do you need assistance with bathing/showering or dressing?: No Do you need assistance with meal preparation?: No Do you need assistance with eating?: No Do you have difficulty maintaining continence: No Do you need assistance with getting out of bed/getting out of a chair/moving?: No Do you have difficulty managing or taking your medications?: No  Folllow up appointments reviewed: PCP Follow-up appointment confirmed?: Yes Date of PCP follow-up appointment?: 05/27/22 Follow-up Provider: [facility] Follow-up appointment confirmed?: NA Do you need transportation to your follow-up appointment?: No Do you understand care options if your condition(s) worsen?: Yes-patient verbalized understanding    [place], [place] Direct Dial [phone]

## 2022-05-27 ENCOUNTER — Ambulatory Visit: Payer: Medicare Other | Admitting: Emergency Medicine

## 2022-05-27 ENCOUNTER — Encounter: Payer: Self-pay | Admitting: Emergency Medicine

## 2022-05-27 VITALS — BP 128/82 | HR 57 | Temp 97.9°F | Ht 59.0 in | Wt 98.5 lb

## 2022-05-27 DIAGNOSIS — R079 Chest pain, unspecified: Secondary | ICD-10-CM | POA: Diagnosis not present

## 2022-05-27 DIAGNOSIS — Z09 Encounter for follow-up examination after completed treatment for conditions other than malignant neoplasm: Secondary | ICD-10-CM

## 2022-05-27 DIAGNOSIS — N1831 Chronic kidney disease, stage 3a: Secondary | ICD-10-CM

## 2022-05-27 DIAGNOSIS — E785 Hyperlipidemia, unspecified: Secondary | ICD-10-CM

## 2022-05-27 DIAGNOSIS — B349 Viral infection, unspecified: Secondary | ICD-10-CM | POA: Diagnosis not present

## 2022-05-27 DIAGNOSIS — I10 Essential (primary) hypertension: Secondary | ICD-10-CM

## 2022-05-27 NOTE — Assessment & Plan Note (Signed)
Well-controlled hypertension. BP Readings from Last 3 Encounters:  05/27/22 128/82  05/20/22 (!) 154/68  12/24/21 138/86  Continue olmesartan 40 mg daily, amlodipine 2.5 mg daily. Cardiovascular risks associated with hypertension discussed. Normal recent chest x-ray.  Normal recent EKG.  Normal recent blood work.

## 2022-05-27 NOTE — Assessment & Plan Note (Signed)
Running its course without complications. Contributing to symptoms but getting over it.

## 2022-05-27 NOTE — Progress Notes (Signed)
Stephanie Aguilar 81 y.o.   Chief Complaint  Patient presents with   Follow-up    F/u visit from ED chest pains     HISTORY OF PRESENT ILLNESS: This is a 82 y.o. female here for follow-up of emergency department visit on 05/19/2022 when she presented complaining of left-sided chest pain radiating to left shoulder and left arm.  Negative workup including chest x-ray, EKG, and blood work.  Has been doing well since then. Was also battling viral upper respiratory infection.  Much better today. No new symptomatology.  No other complaints or medical concerns today.  HPI   Prior to Admission medications   Medication Sig Start Date End Date Taking? Authorizing Provider  acebutolol (SECTRAL) 200 MG capsule Take 1 capsule (200 mg total) by mouth daily. 12/24/21 12/19/22 Yes Darshawn Boateng, Ines Bloomer, MD  amLODipine (NORVASC) 2.5 MG tablet TAKE 1 TABLET EACH DAY. 12/24/21  Yes Khameron Gruenwald, Ines Bloomer, MD  aspirin 81 MG tablet Take 81 mg by mouth daily.   Yes [provider]  Cholecalciferol (VITAMIN D) 2000 units CAPS Take 1 capsule by mouth daily.   Yes [provider]  clobetasol (TEMOVATE) 0.05 % external solution Apply 10 drops to scalp every night at bedtime. 04/11/15  Yes Darlyne Russian, MD  cycloSPORINE (RESTASIS) 0.05 % ophthalmic emulsion 1 drop 2 (two) times daily.   Yes [provider]  desloratadine (CLARINEX) 5 MG tablet Take 1 tablet (5 mg total) by mouth daily. as directed 12/24/21  Yes Dequane Strahan, Ines Bloomer, MD  fluticasone [facility]) 50 MCG/ACT nasal spray USE 2 SPRAYS INTO EACH NOSTRIL DAILY 09/20/14  Yes Daub, Loura Back, MD  LORazepam (ATIVAN) 0.5 MG tablet Take 1 tablet (0.5 mg total) by mouth 2 (two) times daily as needed for anxiety. 12/24/21  Yes Ameira Alessandrini, Ines Bloomer, MD  METRONIDAZOLE, TOPICAL, 0.75 % LOTN APPLY TOPICALLY TO AFFECTED AREA. 09/21/15  Yes Daub, Loura Back, MD  olmesartan (BENICAR) 40 MG tablet TAKE 1 TABLET ONCE DAILY. 02/04/22  Yes Argus Caraher,  Ines Bloomer, MD  OVER THE COUNTER MEDICATION OTC Imodium taking daily for diarrrhea   Yes [provider]  rosuvastatin (CRESTOR) 5 MG tablet Take 0.5 tablets (2.5 mg total) by mouth daily. 12/24/21 12/19/22 Yes Horald Pollen, MD  VIVELLE-DOT 0.0375 MG/24HR  09/05/14  Yes [provider]    Allergies  Allergen Reactions   Codeine    Erythromycin     Sick on stomach   Latex    Lisinopril    Sulfa Antibiotics     rash    Patient Active Problem List   Diagnosis Date Noted   CKD (chronic kidney disease), stage III ([place]) 04/13/2016   PAD (peripheral artery disease) ([place]) 04/12/2016   Anxiety disorder 11/07/2015   Carotid bruit 07/06/2013   Essential hypertension 02/04/2012   Hyperlipidemia 02/04/2012   Menopausal syndrome on hormone replacement therapy 02/04/2012    Past Medical History:  Diagnosis Date   Allergy    Anxiety    Cataract    Depression    Hyperlipidemia    Hypertension     Past Surgical History:  Procedure Laterality Date   ABDOMINAL HYSTERECTOMY     APPENDECTOMY     BREAST SURGERY     DENTAL SURGERY     TONSILLECTOMY AND ADENOIDECTOMY      Social History   Socioeconomic History   Marital status: Married    Spouse name: Not on file   Number of children: Not on file  Years of education: Not on file   Highest education level: Not on file  Occupational History   Occupation: retired/homemaker  Tobacco Use   Smoking status: Never   Smokeless tobacco: Never  Substance and Sexual Activity   Alcohol use: Yes    Comment: 1-2 oz weekly   Drug use: No   Sexual activity: Never  Other Topics Concern   Not on file  Social History Narrative   Not on file   Social Determinants of Health   Financial Resource Strain: Low Risk  (12/13/2021)   Overall Financial Resource Strain (CARDIA)    Difficulty of Paying Living Expenses: Not hard at all  Food Insecurity: No Food Insecurity (12/13/2021)   Hunger Vital Sign    Worried About  Running Out of Food in the Last Year: Never true    Ran Out of Food in the Last Year: Never true  Transportation Needs: No Transportation Needs (12/13/2021)   PRAPARE - Hydrologist (Medical): No    Lack of Transportation (Non-Medical): No  Physical Activity: Sufficiently Active (12/13/2021)   Exercise Vital Sign    Days of Exercise per Week: 7 days    Minutes of Exercise per Session: 30 min  Stress: No Stress Concern Present (12/13/2021)   [place]    Feeling of Stress : Not at all  Social Connections: [place] (12/13/2021)   Social Connection and Isolation Panel [NHANES]    Frequency of Communication with Friends and Family: More than three times a week    Frequency of Social Gatherings with Friends and Family: More than three times a week    Attends Religious Services: More than 4 times per year    Active Member of [employer] or Organizations: Yes    Attends Music therapist: More than 4 times per year    Marital Status: Married  Human resources officer Violence: Not At Risk (12/13/2021)   Humiliation, Afraid, Rape, and Kick questionnaire    Fear of Current or Ex-Partner: No    Emotionally Abused: No    Physically Abused: No    Sexually Abused: No    Family History  Problem Relation Age of Onset   Hypertension Mother    Alcohol abuse Father    COPD Brother    Hypertension Brother    Alcohol abuse Brother    Heart disease Maternal Grandmother    Cancer Maternal Grandfather    Stroke Paternal Grandmother    Gout Brother    Alcohol abuse Brother      Review of Systems  Constitutional: Negative.  Negative for chills and fever.  HENT:  Positive for sore throat. Negative for congestion.   Respiratory: Negative.  Negative for cough and shortness of breath.   Cardiovascular: Negative.  Negative for chest pain and palpitations.  Gastrointestinal: Negative.  Negative for  abdominal pain, diarrhea, nausea and vomiting.  Skin: Negative.  Negative for rash.  Neurological: Negative.  Negative for dizziness and headaches.  All other systems reviewed and are negative.  Today's Vitals   05/27/22 1020  BP: 128/82  Pulse: (!) 57  Temp: 97.9 F (36.6 C)  TempSrc: Oral  SpO2: 97%  Weight: 98 lb 8 oz (44.7 kg)  Height: '4\' 11"'$  (1.499 m)   Body mass index is 19.89 kg/m.   Physical Exam Vitals reviewed.  Constitutional:      Appearance: Normal appearance.  HENT:     Head: Normocephalic.  Mouth/Throat:     Mouth: Mucous membranes are moist.     Pharynx: Oropharynx is clear.  Eyes:     Extraocular Movements: Extraocular movements intact.     Conjunctiva/sclera: Conjunctivae normal.     Pupils: Pupils are equal, round, and reactive to light.  Cardiovascular:     Rate and Rhythm: Normal rate and regular rhythm.     Pulses: Normal pulses.     Heart sounds: Normal heart sounds.  Pulmonary:     Effort: Pulmonary effort is normal.     Breath sounds: Normal breath sounds.  Musculoskeletal:     Cervical back: No tenderness.     Right lower leg: No edema.     Left lower leg: No edema.  Lymphadenopathy:     Cervical: No cervical adenopathy.  Skin:    General: Skin is warm and dry.  Neurological:     General: No focal deficit present.     Mental Status: She is alert and oriented to person, place, and time.  Psychiatric:        Mood and Affect: Mood normal.        Behavior: Behavior normal.      ASSESSMENT & PLAN: A total of 44 minutes was spent with the patient and counseling/coordination of care regarding preparing for this visit, review of most recent office visit notes, review of most recent emergency department visit notes, review of multiple chronic medical conditions and their treatment, review of all medications, review of most recent chest x-ray report, review of most recent blood work results, prognosis, documentation, and need for  follow-up.  Problem List Items Addressed This Visit       Cardiovascular and Mediastinum   Essential hypertension    Well-controlled hypertension. BP Readings from Last 3 Encounters:  05/27/22 128/82  05/20/22 (!) 154/68  12/24/21 138/86  Continue olmesartan 40 mg daily, amlodipine 2.5 mg daily. Cardiovascular risks associated with hypertension discussed. Normal recent chest x-ray.  Normal recent EKG.  Normal recent blood work.         Genitourinary   CKD (chronic kidney disease), stage III (H[facility])    Chronic stable condition. Advised to stay well-hydrated and avoid NSAIDs        Other   Hyperlipidemia    Stable chronic condition.  Continue rosuvastatin 5 mg daily.      Nonspecific chest pain - Primary    Clinically stable.  No red flag signs or symptoms. Normal workup at the hospital Normal chest x-ray.  Stable EKG.  Stable blood work. No new symptoms. Asymptomatic today.      Viral illness    Running its course without complications. Contributing to symptoms but getting over it.      Other Visit Diagnoses     Hospital discharge follow-up          Patient Instructions  Health Maintenance After Age 1 After age 3, you are at a higher risk for certain long-term diseases and infections as well as injuries from falls. Falls are a major cause of broken bones and head injuries in people who are older than age 48. Getting regular preventive care can help to keep you healthy and well. Preventive care includes getting regular testing and making lifestyle changes as recommended by your health care provider. Talk with your health care provider about: Which screenings and tests you should have. A screening is a test that checks for a disease when you have no symptoms. A diet and exercise plan that is  right for you. What should I know about screenings and tests to prevent falls? Screening and testing are the best ways to find a health problem early. Early diagnosis and  treatment give you the best chance of managing medical conditions that are common after age 20. Certain conditions and lifestyle choices may make you more likely to have a fall. Your health care provider may recommend: Regular vision checks. Poor vision and conditions such as cataracts can make you more likely to have a fall. If you wear glasses, make sure to get your prescription updated if your vision changes. Medicine review. Work with your health care provider to regularly review all of the medicines you are taking, including over-the-counter medicines. Ask your health care provider about any side effects that may make you more likely to have a fall. Tell your health care provider if any medicines that you take make you feel dizzy or sleepy. Strength and balance checks. Your health care provider may recommend certain tests to check your strength and balance while standing, walking, or changing positions. Foot health exam. Foot pain and numbness, as well as not wearing proper footwear, can make you more likely to have a fall. Screenings, including: Osteoporosis screening. Osteoporosis is a condition that causes the bones to get weaker and break more easily. Blood pressure screening. Blood pressure changes and medicines to control blood pressure can make you feel dizzy. Depression screening. You may be more likely to have a fall if you have a fear of falling, feel depressed, or feel unable to do activities that you used to do. Alcohol use screening. Using too much alcohol can affect your balance and may make you more likely to have a fall. Follow these instructions at home: Lifestyle Do not drink alcohol if: Your health care provider tells you not to drink. If you drink alcohol: Limit how much you have to: 0-1 drink a day for women. 0-2 drinks a day for men. Know how much alcohol is in your drink. In the [place] one drink equals one 12 oz bottle of beer (355 mL), one 5 oz glass of wine (148 mL), or  one 1 oz glass of hard liquor (44 mL). Do not use any products that contain nicotine or tobacco. These products include cigarettes, chewing tobacco, and vaping devices, such as e-cigarettes. If you need help quitting, ask your health care provider. Activity  Follow a regular exercise program to stay fit. This will help you maintain your balance. Ask your health care provider what types of exercise are appropriate for you. If you need a cane or walker, use it as recommended by your health care provider. Wear supportive shoes that have nonskid soles. Safety  Remove any tripping hazards, such as rugs, cords, and clutter. Install safety equipment such as grab bars in bathrooms and safety rails on stairs. Keep rooms and walkways well-lit. General instructions Talk with your health care provider about your risks for falling. Tell your health care provider if: You fall. Be sure to tell your health care provider about all falls, even ones that seem minor. You feel dizzy, tiredness (fatigue), or off-balance. Take over-the-counter and prescription medicines only as told by your health care provider. These include supplements. Eat a healthy diet and maintain a healthy weight. A healthy diet includes low-fat dairy products, low-fat (lean) meats, and fiber from whole grains, beans, and lots of fruits and vegetables. Stay current with your vaccines. Schedule regular health, dental, and eye exams. Summary Having a healthy lifestyle  and getting preventive care can help to protect your health and wellness after age 40. Screening and testing are the best way to find a health problem early and help you avoid having a fall. Early diagnosis and treatment give you the best chance for managing medical conditions that are more common for people who are older than age 14. Falls are a major cause of broken bones and head injuries in people who are older than age 57. Take precautions to prevent a fall at home. Work  with your health care provider to learn what changes you can make to improve your health and wellness and to prevent falls. This information is not intended to replace advice given to you by your health care provider. Make sure you discuss any questions you have with your health care provider. Document Revised: 08/07/2020 Document Reviewed: 08/07/2020 Elsevier Patient Education  [place], MD [facility]

## 2022-05-27 NOTE — Patient Instructions (Signed)
Health Maintenance After Age 81 After age 81, you are at a higher risk for certain long-term diseases and infections as well as injuries from falls. Falls are a major cause of broken bones and head injuries in people who are older than age 81. Getting regular preventive care can help to keep you healthy and well. Preventive care includes getting regular testing and making lifestyle changes as recommended by your health care provider. Talk with your health care provider about: Which screenings and tests you should have. A screening is a test that checks for a disease when you have no symptoms. A diet and exercise plan that is right for you. What should I know about screenings and tests to prevent falls? Screening and testing are the best ways to find a health problem early. Early diagnosis and treatment give you the best chance of managing medical conditions that are common after age 81. Certain conditions and lifestyle choices may make you more likely to have a fall. Your health care provider may recommend: Regular vision checks. Poor vision and conditions such as cataracts can make you more likely to have a fall. If you wear glasses, make sure to get your prescription updated if your vision changes. Medicine review. Work with your health care provider to regularly review all of the medicines you are taking, including over-the-counter medicines. Ask your health care provider about any side effects that may make you more likely to have a fall. Tell your health care provider if any medicines that you take make you feel dizzy or sleepy. Strength and balance checks. Your health care provider may recommend certain tests to check your strength and balance while standing, walking, or changing positions. Foot health exam. Foot pain and numbness, as well as not wearing proper footwear, can make you more likely to have a fall. Screenings, including: Osteoporosis screening. Osteoporosis is a condition that causes  the bones to get weaker and break more easily. Blood pressure screening. Blood pressure changes and medicines to control blood pressure can make you feel dizzy. Depression screening. You may be more likely to have a fall if you have a fear of falling, feel depressed, or feel unable to do activities that you used to do. Alcohol use screening. Using too much alcohol can affect your balance and may make you more likely to have a fall. Follow these instructions at home: Lifestyle Do not drink alcohol if: Your health care provider tells you not to drink. If you drink alcohol: Limit how much you have to: 0-1 drink a day for women. 0-2 drinks a day for men. Know how much alcohol is in your drink. In the [place] one drink equals one 12 oz bottle of beer (355 mL), one 5 oz glass of wine (148 mL), or one 1 oz glass of hard liquor (44 mL). Do not use any products that contain nicotine or tobacco. These products include cigarettes, chewing tobacco, and vaping devices, such as e-cigarettes. If you need help quitting, ask your health care provider. Activity  Follow a regular exercise program to stay fit. This will help you maintain your balance. Ask your health care provider what types of exercise are appropriate for you. If you need a cane or walker, use it as recommended by your health care provider. Wear supportive shoes that have nonskid soles. Safety  Remove any tripping hazards, such as rugs, cords, and clutter. Install safety equipment such as grab bars in bathrooms and safety rails on stairs. Keep rooms and walkways   well-lit. General instructions Talk with your health care provider about your risks for falling. Tell your health care provider if: You fall. Be sure to tell your health care provider about all falls, even ones that seem minor. You feel dizzy, tiredness (fatigue), or off-balance. Take over-the-counter and prescription medicines only as told by your health care provider. These include  supplements. Eat a healthy diet and maintain a healthy weight. A healthy diet includes low-fat dairy products, low-fat (lean) meats, and fiber from whole grains, beans, and lots of fruits and vegetables. Stay current with your vaccines. Schedule regular health, dental, and eye exams. Summary Having a healthy lifestyle and getting preventive care can help to protect your health and wellness after age 81. Screening and testing are the best way to find a health problem early and help you avoid having a fall. Early diagnosis and treatment give you the best chance for managing medical conditions that are more common for people who are older than age 81. Falls are a major cause of broken bones and head injuries in people who are older than age 81. Take precautions to prevent a fall at home. Work with your health care provider to learn what changes you can make to improve your health and wellness and to prevent falls. This information is not intended to replace advice given to you by your health care provider. Make sure you discuss any questions you have with your health care provider. Document Revised: 08/07/2020 Document Reviewed: 08/07/2020 Elsevier Patient Education  2023 Elsevier Inc.  

## 2022-05-27 NOTE — Assessment & Plan Note (Signed)
Stable chronic condition.  Continue rosuvastatin 5 mg daily.

## 2022-05-27 NOTE — Assessment & Plan Note (Signed)
Clinically stable.  No red flag signs or symptoms. Normal workup at the hospital Normal chest x-ray.  Stable EKG.  Stable blood work. No new symptoms. Asymptomatic today.

## 2022-05-27 NOTE — Assessment & Plan Note (Signed)
Chronic stable condition. Advised to stay well-hydrated and avoid NSAIDs

## 2022-09-26 ENCOUNTER — Other Ambulatory Visit: Payer: Self-pay | Admitting: Emergency Medicine

## 2022-09-26 DIAGNOSIS — F411 Generalized anxiety disorder: Secondary | ICD-10-CM

## 2022-12-31 ENCOUNTER — Encounter: Payer: Self-pay | Admitting: Emergency Medicine

## 2022-12-31 ENCOUNTER — Ambulatory Visit (INDEPENDENT_AMBULATORY_CARE_PROVIDER_SITE_OTHER): Payer: Medicare Other | Admitting: Emergency Medicine

## 2022-12-31 VITALS — BP 132/78 | HR 60 | Temp 97.8°F | Ht 59.0 in | Wt 98.1 lb

## 2022-12-31 DIAGNOSIS — Z0001 Encounter for general adult medical examination with abnormal findings: Secondary | ICD-10-CM

## 2022-12-31 DIAGNOSIS — Z1329 Encounter for screening for other suspected endocrine disorder: Secondary | ICD-10-CM | POA: Diagnosis not present

## 2022-12-31 DIAGNOSIS — N1831 Chronic kidney disease, stage 3a: Secondary | ICD-10-CM | POA: Diagnosis not present

## 2022-12-31 DIAGNOSIS — Z23 Encounter for immunization: Secondary | ICD-10-CM

## 2022-12-31 DIAGNOSIS — I10 Essential (primary) hypertension: Secondary | ICD-10-CM | POA: Diagnosis not present

## 2022-12-31 DIAGNOSIS — Z13228 Encounter for screening for other metabolic disorders: Secondary | ICD-10-CM

## 2022-12-31 DIAGNOSIS — I739 Peripheral vascular disease, unspecified: Secondary | ICD-10-CM | POA: Diagnosis not present

## 2022-12-31 DIAGNOSIS — J309 Allergic rhinitis, unspecified: Secondary | ICD-10-CM

## 2022-12-31 DIAGNOSIS — Z130 Encounter for screening for diseases of the blood and blood-forming organs and certain disorders involving the immune mechanism: Secondary | ICD-10-CM

## 2022-12-31 DIAGNOSIS — E785 Hyperlipidemia, unspecified: Secondary | ICD-10-CM

## 2022-12-31 DIAGNOSIS — E782 Mixed hyperlipidemia: Secondary | ICD-10-CM

## 2022-12-31 DIAGNOSIS — F411 Generalized anxiety disorder: Secondary | ICD-10-CM

## 2022-12-31 LAB — COMPREHENSIVE METABOLIC PANEL
ALT: 14 U/L (ref 0–35)
AST: 19 U/L (ref 0–37)
Albumin: 4.3 g/dL (ref 3.5–5.2)
Alkaline Phosphatase: 58 U/L (ref 39–117)
BUN: 21 mg/dL (ref 6–23)
CO2: 30 meq/L (ref 19–32)
Calcium: 9.7 mg/dL (ref 8.4–10.5)
Chloride: 104 meq/L (ref 96–112)
Creatinine, Ser: 1.13 mg/dL (ref 0.40–1.20)
GFR: 45.59 mL/min — ABNORMAL LOW (ref >60.00)
Glucose, Bld: 91 mg/dL (ref 70–99)
Potassium: 4.1 meq/L (ref 3.5–5.1)
Sodium: 141 meq/L (ref 135–145)
Total Bilirubin: 0.8 mg/dL (ref 0.2–1.2)
Total Protein: 7.1 g/dL (ref 6.0–8.3)

## 2022-12-31 LAB — CBC WITH DIFFERENTIAL/PLATELET
Basophils Absolute: 0.1 10*3/uL (ref 0.0–0.1)
Basophils Relative: 1.2 % (ref 0.0–3.0)
Eosinophils Absolute: 0.3 10*3/uL (ref 0.0–0.7)
Eosinophils Relative: 3.8 % (ref 0.0–5.0)
HCT: 44.5 % (ref 36.0–46.0)
Hemoglobin: 14.4 g/dL (ref 12.0–15.0)
Lymphocytes Relative: 20.6 % (ref 12.0–46.0)
Lymphs Abs: 1.4 10*3/uL (ref 0.7–4.0)
MCHC: 32.4 g/dL (ref 30.0–36.0)
MCV: 92.7 fL (ref 78.0–100.0)
Monocytes Absolute: 0.6 10*3/uL (ref 0.1–1.0)
Monocytes Relative: 9.2 % (ref 3.0–12.0)
Neutro Abs: 4.4 10*3/uL (ref 1.4–7.7)
Neutrophils Relative %: 65.2 % (ref 43.0–77.0)
Platelets: 182 10*3/uL (ref 150.0–400.0)
RBC: 4.8 Mil/uL (ref 3.87–5.11)
RDW: 13.8 % (ref 11.5–15.5)
WBC: 6.7 10*3/uL (ref 4.0–10.5)

## 2022-12-31 LAB — LIPID PANEL
Cholesterol: 131 mg/dL (ref 0–200)
HDL: 62.8 mg/dL (ref >39.00)
LDL Cholesterol: 41 mg/dL (ref 0–99)
NonHDL: 67.9
Total CHOL/HDL Ratio: 2
Triglycerides: 133 mg/dL (ref 0.0–149.0)
VLDL: 26.6 mg/dL (ref 0.0–40.0)

## 2022-12-31 LAB — HEMOGLOBIN A1C: Hgb A1c MFr Bld: 5.8 % (ref 4.6–6.5)

## 2022-12-31 MED ORDER — LORAZEPAM 0.5 MG PO TABS: 0.5000 mg | ORAL_TABLET | Freq: Two times a day (BID) | ORAL | 1 refills | Status: DC | PRN

## 2022-12-31 MED ORDER — OLMESARTAN MEDOXOMIL 40 MG PO TABS: 40.0000 mg | ORAL_TABLET | Freq: Every day | ORAL | 3 refills | Status: DC

## 2022-12-31 MED ORDER — DESLORATADINE 5 MG PO TABS: 5.0000 mg | ORAL_TABLET | Freq: Every day | ORAL | 3 refills | Status: DC

## 2022-12-31 MED ORDER — ACEBUTOLOL HCL 200 MG PO CAPS: 200.0000 mg | ORAL_CAPSULE | Freq: Every day | ORAL | 3 refills | Status: DC

## 2022-12-31 MED ORDER — ROSUVASTATIN CALCIUM 5 MG PO TABS: 2.5000 mg | ORAL_TABLET | Freq: Every day | ORAL | 3 refills | Status: DC

## 2022-12-31 MED ORDER — AMLODIPINE BESYLATE 2.5 MG PO TABS: ORAL_TABLET | ORAL | 3 refills | Status: DC

## 2022-12-31 NOTE — Assessment & Plan Note (Signed)
Stable chronic condition Continues daily baby aspirin

## 2022-12-31 NOTE — Assessment & Plan Note (Signed)
Chronic stable condition. Advised to stay well-hydrated and avoid NSAIDs

## 2022-12-31 NOTE — Patient Instructions (Signed)

## 2022-12-31 NOTE — Progress Notes (Signed)
Stephanie Aguilar 82 y.o.   Chief Complaint  Patient presents with   Annual Exam    Patient states she has a mole on her back that keeps getting bigger, changing in size, had it for about a year or so       HISTORY OF PRESENT ILLNESS: This is a 82 y.o. female A1A here for annual exam and follow-up on chronic medical conditions including hypertension and dyslipidemia. Overall doing very well. Healthy lifestyle.  Exercises regularly and eats very well. Little concerned about skin lesion on her back.  Had it looked at by dermatologist earlier this year and there were no concerns. No other complaints or medical concerns today. BP Readings from Last 3 Encounters:  05/27/22 128/82  05/20/22 (!) 154/68  12/24/21 138/86     HPI   Prior to Admission medications   Medication Sig Start Date End Date Taking? Authorizing Provider  acebutolol (SECTRAL) 200 MG capsule Take 1 capsule (200 mg total) by mouth daily. 12/24/21 12/19/22  Georgina Quint, MD  amLODipine (NORVASC) 2.5 MG tablet TAKE 1 TABLET EACH DAY. 12/24/21   Georgina Quint, MD  aspirin 81 MG tablet Take 81 mg by mouth daily.    [provider]  Cholecalciferol (VITAMIN D) 2000 units CAPS Take 1 capsule by mouth daily.    [provider]  clobetasol (TEMOVATE) 0.05 % external solution Apply 10 drops to scalp every night at bedtime. 04/11/15   Collene Gobble, MD  cycloSPORINE (RESTASIS) 0.05 % ophthalmic emulsion 1 drop 2 (two) times daily.    [provider]  desloratadine (CLARINEX) 5 MG tablet Take 1 tablet (5 mg total) by mouth daily. as directed 12/24/21   Georgina Quint, MD  fluticasone [facility]) 50 MCG/ACT nasal spray USE 2 SPRAYS INTO EACH NOSTRIL DAILY 09/20/14   Collene Gobble, MD  LORazepam (ATIVAN) 0.5 MG tablet Take 1 tablet (0.5 mg total) by mouth 2 (two) times daily as needed for anxiety. 09/26/22   Georgina Quint, MD  METRONIDAZOLE, TOPICAL, 0.75 % LOTN APPLY  TOPICALLY TO AFFECTED AREA. 09/21/15   Collene Gobble, MD  olmesartan (BENICAR) 40 MG tablet TAKE 1 TABLET ONCE DAILY. 02/04/22   Georgina Quint, MD  OVER THE COUNTER MEDICATION OTC Imodium taking daily for diarrrhea    [provider]  rosuvastatin (CRESTOR) 5 MG tablet Take 0.5 tablets (2.5 mg total) by mouth daily. 12/24/21 12/19/22  Georgina Quint, MD  VIVELLE-DOT 0.0375 MG/24HR  09/05/14   [provider]    Allergies  Allergen Reactions   Codeine    Erythromycin     Sick on stomach   Latex    Lisinopril    Sulfa Antibiotics     rash    Patient Active Problem List   Diagnosis Date Noted   Nonspecific chest pain 05/27/2022   Viral illness 05/27/2022   CKD (chronic kidney disease), stage III (HCC) 04/13/2016   PAD (peripheral artery disease) (HCC) 04/12/2016   Anxiety disorder 11/07/2015   Carotid bruit 07/06/2013   Essential hypertension 02/04/2012   Hyperlipidemia 02/04/2012   Menopausal syndrome on hormone replacement therapy 02/04/2012    Past Medical History:  Diagnosis Date   Allergy    Anxiety    Cataract    Depression    Hyperlipidemia    Hypertension     Past Surgical History:  Procedure Laterality Date   ABDOMINAL HYSTERECTOMY     APPENDECTOMY     BREAST SURGERY  DENTAL SURGERY     TONSILLECTOMY AND ADENOIDECTOMY      Social History   Socioeconomic History   Marital status: Married    Spouse name: Not on file   Number of children: Not on file   Years of education: Not on file   Highest education level: Not on file  Occupational History   Occupation: retired/homemaker  Tobacco Use   Smoking status: Never   Smokeless tobacco: Never  Substance and Sexual Activity   Alcohol use: Yes    Comment: 1-2 oz weekly   Drug use: No   Sexual activity: Never  Other Topics Concern   Not on file  Social History Narrative   Not on file   Social Determinants of Health   Financial Resource Strain: Low Risk  (12/13/2021)    Overall Financial Resource Strain (CARDIA)    Difficulty of Paying Living Expenses: Not hard at all  Food Insecurity: No Food Insecurity (12/13/2021)   Hunger Vital Sign    Worried About Running Out of Food in the Last Year: Never true    Ran Out of Food in the Last Year: Never true  Transportation Needs: No Transportation Needs (12/13/2021)   PRAPARE - Administrator, Civil Service (Medical): No    Lack of Transportation (Non-Medical): No  Physical Activity: Sufficiently Active (12/13/2021)   Exercise Vital Sign    Days of Exercise per Week: 7 days    Minutes of Exercise per Session: 30 min  Stress: No Stress Concern Present (12/13/2021)   [employer] of Occupational Health - Occupational Stress Questionnaire    Feeling of Stress : Not at all  Social Connections: Socially Integrated (12/13/2021)   Social Connection and Isolation Panel [NHANES]    Frequency of Communication with Friends and Family: More than three times a week    Frequency of Social Gatherings with Friends and Family: More than three times a week    Attends Religious Services: More than 4 times per year    Active Member of [employer] or Organizations: Yes    Attends Engineer, structural: More than 4 times per year    Marital Status: Married  Catering manager Violence: Not At Risk (12/13/2021)   Humiliation, Afraid, Rape, and Kick questionnaire    Fear of Current or Ex-Partner: No    Emotionally Abused: No    Physically Abused: No    Sexually Abused: No    Family History  Problem Relation Age of Onset   Hypertension Mother    Alcohol abuse Father    COPD Brother    Hypertension Brother    Alcohol abuse Brother    Heart disease Maternal Grandmother    Cancer Maternal Grandfather    Stroke Paternal Grandmother    Gout Brother    Alcohol abuse Brother      Review of Systems  Constitutional: Negative.  Negative for chills and fever.  HENT: Negative.  Negative for congestion and sore  throat.   Respiratory: Negative.  Negative for cough and shortness of breath.   Cardiovascular: Negative.  Negative for chest pain and palpitations.  Gastrointestinal:  Negative for abdominal pain, diarrhea, nausea and vomiting.  Genitourinary: Negative.  Negative for dysuria and hematuria.  Skin:        Mole on her back  Neurological: Negative.  Negative for dizziness and headaches.  All other systems reviewed and are negative.   Today's Vitals   12/31/22 0832  BP: 132/78  Pulse: 60  Temp:  97.8 F (36.6 C)  TempSrc: Oral  SpO2: 100%  Weight: 98 lb 2 oz (44.5 kg)  Height: 4\' 11"  (1.499 m)   Body mass index is 19.82 kg/m.   Physical Exam Vitals reviewed.  Constitutional:      Appearance: Normal appearance.  HENT:     Head: Normocephalic.     Right Ear: Tympanic membrane, ear canal and external ear normal.     Left Ear: Tympanic membrane, ear canal and external ear normal.     Mouth/Throat:     Mouth: Mucous membranes are moist.     Pharynx: Oropharynx is clear.  Eyes:     Extraocular Movements: Extraocular movements intact.     Pupils: Pupils are equal, round, and reactive to light.  Cardiovascular:     Rate and Rhythm: Normal rate and regular rhythm.     Pulses: Normal pulses.     Heart sounds: Normal heart sounds.  Pulmonary:     Effort: Pulmonary effort is normal.     Breath sounds: Normal breath sounds.  Abdominal:     Palpations: Abdomen is soft.     Tenderness: There is no abdominal tenderness.  Musculoskeletal:     Cervical back: No tenderness.     Right lower leg: No edema.     Left lower leg: No edema.  Lymphadenopathy:     Cervical: No cervical adenopathy.  Skin:    General: Skin is warm and dry.     Capillary Refill: Capillary refill takes less than 2 seconds.     Comments: Small mole on her back.  Skin colored.  Benign looking.  Neurological:     General: No focal deficit present.     Mental Status: She is alert and oriented to person, place,  and time.  Psychiatric:        Mood and Affect: Mood normal.        Behavior: Behavior normal.      ASSESSMENT & PLAN: Problem List Items Addressed This Visit       Cardiovascular and Mediastinum   Essential hypertension    Well-controlled hypertension for the most part. A couple of abnormal high readings at home over the past couple of months Continue amlodipine 2.5 mg daily.  May take additional dose as needed Continue Sectral 200 mg daily and olmesartan 40 mg daily Cardiovascular risks associated with hypertension discussed Diet and nutrition discussed      Relevant Medications   acebutolol (SECTRAL) 200 MG capsule   amLODipine (NORVASC) 2.5 MG tablet   olmesartan (BENICAR) 40 MG tablet   rosuvastatin (CRESTOR) 5 MG tablet   Other Relevant Orders   Comprehensive metabolic panel   PAD (peripheral artery disease) (HCC)    Stable chronic condition Continues daily baby aspirin      Relevant Medications   acebutolol (SECTRAL) 200 MG capsule   amLODipine (NORVASC) 2.5 MG tablet   olmesartan (BENICAR) 40 MG tablet   rosuvastatin (CRESTOR) 5 MG tablet   Other Relevant Orders   CBC with Differential     Genitourinary   CKD (chronic kidney disease), stage III (HCC)    Chronic stable condition Advised to stay well-hydrated and avoid NSAIDs      Relevant Orders   CBC with Differential   Comprehensive metabolic panel     Other   Dyslipidemia    Stable chronic condition Lipid profile done today Continues low-dose rosuvastatin 2.5 mg daily Higher doses gave her musculoskeletal side effects      Relevant  Medications   rosuvastatin (CRESTOR) 5 MG tablet   Other Relevant Orders   Hemoglobin A1c   Lipid panel   Anxiety disorder    Stable and well-controlled Continues lorazepam 0.5 mg daily as needed      Relevant Medications   LORazepam (ATIVAN) 0.5 MG tablet   Other Visit Diagnoses     Encounter for general adult medical examination with abnormal findings     -  Primary   Relevant Orders   CBC with Differential   Comprehensive metabolic panel   Hemoglobin A1c   Lipid panel   Essential hypertension, benign       Relevant Medications   acebutolol (SECTRAL) 200 MG capsule   amLODipine (NORVASC) 2.5 MG tablet   olmesartan (BENICAR) 40 MG tablet   rosuvastatin (CRESTOR) 5 MG tablet   Allergic rhinitis, unspecified seasonality, unspecified trigger       Relevant Medications   desloratadine (CLARINEX) 5 MG tablet   Mixed hyperlipidemia       Relevant Medications   acebutolol (SECTRAL) 200 MG capsule   amLODipine (NORVASC) 2.5 MG tablet   olmesartan (BENICAR) 40 MG tablet   rosuvastatin (CRESTOR) 5 MG tablet   Need for vaccination       Relevant Orders   Flu Vaccine Trivalent High Dose (Fluad)   Screening for deficiency anemia       Relevant Orders   CBC with Differential   Screening for endocrine, metabolic and immunity disorder       Relevant Orders   Comprehensive metabolic panel   Hemoglobin A1c      Modifiable risk factors discussed with patient. Anticipatory guidance according to age provided. The following topics were also discussed: Social Determinants of Health Smoking.  Non-smoker Diet and nutrition Benefits of exercise Cancer screening and review of most recent mammogram report Vaccinations review and recommendations Cardiovascular risk assessment Review of multiple chronic medical conditions under management Review of all medications Mental health including depression and anxiety Fall and accident prevention  Patient Instructions  Health Maintenance, Female Adopting a healthy lifestyle and getting preventive care are important in promoting health and wellness. Ask your health care provider about: The right schedule for you to have regular tests and exams. Things you can do on your own to prevent diseases and keep yourself healthy. What should I know about diet, weight, and exercise? Eat a healthy diet  Eat a  diet that includes plenty of vegetables, fruits, low-fat dairy products, and lean protein. Do not eat a lot of foods that are high in solid fats, added sugars, or sodium. Maintain a healthy weight Body mass index (BMI) is used to identify weight problems. It estimates body fat based on height and weight. Your health care provider can help determine your BMI and help you achieve or maintain a healthy weight. Get regular exercise Get regular exercise. This is one of the most important things you can do for your health. Most adults should: Exercise for at least 150 minutes each week. The exercise should increase your heart rate and make you sweat (moderate-intensity exercise). Do strengthening exercises at least twice a week. This is in addition to the moderate-intensity exercise. Spend less time sitting. Even light physical activity can be beneficial. Watch cholesterol and blood lipids Have your blood tested for lipids and cholesterol at 81 years of age, then have this test every 5 years. Have your cholesterol levels checked more often if: Your lipid or cholesterol levels are high. You are older than 81  years of age. You are at high risk for heart disease. What should I know about cancer screening? Depending on your health history and family history, you may need to have cancer screening at various ages. This may include screening for: Breast cancer. Cervical cancer. Colorectal cancer. Skin cancer. Lung cancer. What should I know about heart disease, diabetes, and high blood pressure? Blood pressure and heart disease High blood pressure causes heart disease and increases the risk of stroke. This is more likely to develop in people who have high blood pressure readings or are overweight. Have your blood pressure checked: Every 3-5 years if you are 43-77 years of age. Every year if you are 101 years old or older. Diabetes Have regular diabetes screenings. This checks your fasting blood sugar  level. Have the screening done: Once every three years after age 21 if you are at a normal weight and have a low risk for diabetes. More often and at a younger age if you are overweight or have a high risk for diabetes. What should I know about preventing infection? Hepatitis B If you have a higher risk for hepatitis B, you should be screened for this virus. Talk with your health care provider to find out if you are at risk for hepatitis B infection. Hepatitis C Testing is recommended for: Everyone born from [birth year] through 1964. Anyone with known risk factors for hepatitis C. Sexually transmitted infections (STIs) Get screened for STIs, including gonorrhea and chlamydia, if: You are sexually active and are younger than 81 years of age. You are older than 81 years of age and your health care provider tells you that you are at risk for this type of infection. Your sexual activity has changed since you were last screened, and you are at increased risk for chlamydia or gonorrhea. Ask your health care provider if you are at risk. Ask your health care provider about whether you are at high risk for HIV. Your health care provider may recommend a prescription medicine to help prevent HIV infection. If you choose to take medicine to prevent HIV, you should first get tested for HIV. You should then be tested every 3 months for as long as you are taking the medicine. Pregnancy If you are about to stop having your period (premenopausal) and you may become pregnant, seek counseling before you get pregnant. Take 400 to 800 micrograms (mcg) of folic acid every day if you become pregnant. Ask for birth control (contraception) if you want to prevent pregnancy. Osteoporosis and menopause Osteoporosis is a disease in which the bones lose minerals and strength with aging. This can result in bone fractures. If you are 3 years old or older, or if you are at risk for osteoporosis and fractures, ask your health care  provider if you should: Be screened for bone loss. Take a calcium or vitamin D supplement to lower your risk of fractures. Be given hormone replacement therapy (HRT) to treat symptoms of menopause. Follow these instructions at home: Alcohol use Do not drink alcohol if: Your health care provider tells you not to drink. You are pregnant, may be pregnant, or are planning to become pregnant. If you drink alcohol: Limit how much you have to: 0-1 drink a day. Know how much alcohol is in your drink. In the [place] one drink equals one 12 oz bottle of beer (355 mL), one 5 oz glass of wine (148 mL), or one 1 oz glass of hard liquor (44 mL). Lifestyle Do not use  any products that contain nicotine or tobacco. These products include cigarettes, chewing tobacco, and vaping devices, such as e-cigarettes. If you need help quitting, ask your health care provider. Do not use street drugs. Do not share needles. Ask your health care provider for help if you need support or information about quitting drugs. General instructions Schedule regular health, dental, and eye exams. Stay current with your vaccines. Tell your health care provider if: You often feel depressed. You have ever been abused or do not feel safe at home. Summary Adopting a healthy lifestyle and getting preventive care are important in promoting health and wellness. Follow your health care provider's instructions about healthy diet, exercising, and getting tested or screened for diseases. Follow your health care provider's instructions on monitoring your cholesterol and blood pressure. This information is not intended to replace advice given to you by your health care provider. Make sure you discuss any questions you have with your health care provider. Document Revised: 08/07/2020 Document Reviewed: 08/07/2020 Elsevier Patient Education  2024 Elsevier Inc.     Edwina Barth, MD [facility]

## 2022-12-31 NOTE — Assessment & Plan Note (Signed)
Well-controlled hypertension for the most part. A couple of abnormal high readings at home over the past couple of months Continue amlodipine 2.5 mg daily.  May take additional dose as needed Continue Sectral 200 mg daily and olmesartan 40 mg daily Cardiovascular risks associated with hypertension discussed Diet and nutrition discussed

## 2022-12-31 NOTE — Assessment & Plan Note (Signed)
Stable and well-controlled Continues lorazepam 0.5 mg daily as needed

## 2022-12-31 NOTE — Assessment & Plan Note (Signed)
Stable chronic condition Lipid profile done today Continues low-dose rosuvastatin 2.5 mg daily Higher doses gave her musculoskeletal side effects

## 2023-02-19 LAB — HM MAMMOGRAPHY

## 2023-04-21 ENCOUNTER — Other Ambulatory Visit: Payer: Self-pay

## 2023-04-21 ENCOUNTER — Observation Stay (HOSPITAL_BASED_OUTPATIENT_CLINIC_OR_DEPARTMENT_OTHER)
Admission: EM | Admit: 2023-04-21 | Discharge: 2023-04-23 | Disposition: A | Discharge status: Home / Self Care | Payer: Medicare Other | Attending: Emergency Medicine | Admitting: Emergency Medicine

## 2023-04-21 ENCOUNTER — Encounter (HOSPITAL_BASED_OUTPATIENT_CLINIC_OR_DEPARTMENT_OTHER): Payer: Self-pay | Admitting: Emergency Medicine

## 2023-04-21 DIAGNOSIS — Z91040 Latex allergy status: Secondary | ICD-10-CM | POA: Insufficient documentation

## 2023-04-21 DIAGNOSIS — Z7982 Long term (current) use of aspirin: Secondary | ICD-10-CM | POA: Diagnosis not present

## 2023-04-21 DIAGNOSIS — H8112 Benign paroxysmal vertigo, left ear: Secondary | ICD-10-CM | POA: Diagnosis not present

## 2023-04-21 DIAGNOSIS — Z79899 Other long term (current) drug therapy: Secondary | ICD-10-CM | POA: Diagnosis not present

## 2023-04-21 DIAGNOSIS — H81392 Other peripheral vertigo, left ear: Secondary | ICD-10-CM | POA: Diagnosis present

## 2023-04-21 DIAGNOSIS — E785 Hyperlipidemia, unspecified: Secondary | ICD-10-CM | POA: Diagnosis not present

## 2023-04-21 DIAGNOSIS — R42 Dizziness and giddiness: Secondary | ICD-10-CM | POA: Diagnosis present

## 2023-04-21 DIAGNOSIS — I10 Essential (primary) hypertension: Secondary | ICD-10-CM | POA: Diagnosis not present

## 2023-04-21 LAB — COMPREHENSIVE METABOLIC PANEL
ALT: 14 U/L (ref 0–44)
AST: 17 U/L (ref 15–41)
Albumin: 4.2 g/dL (ref 3.5–5.0)
Alkaline Phosphatase: 73 U/L (ref 38–126)
Anion gap: 5 (ref 5–15)
BUN: 23 mg/dL (ref 8–23)
CO2: 29 mmol/L (ref 22–32)
Calcium: 9.9 mg/dL (ref 8.9–10.3)
Chloride: 104 mmol/L (ref 98–111)
Creatinine, Ser: 0.95 mg/dL (ref 0.44–1.00)
GFR, Estimated: >60 mL/min (ref >60)
Glucose, Bld: 107 mg/dL — ABNORMAL HIGH (ref 70–99)
Potassium: 4 mmol/L (ref 3.5–5.1)
Sodium: 138 mmol/L (ref 135–145)
Total Bilirubin: 0.6 mg/dL (ref 0.0–1.2)
Total Protein: 6.8 g/dL (ref 6.5–8.1)

## 2023-04-21 LAB — CBC WITH DIFFERENTIAL/PLATELET
Abs Immature Granulocytes: 0.02 10*3/uL (ref 0.00–0.07)
Basophils Absolute: 0.1 10*3/uL (ref 0.0–0.1)
Basophils Relative: 1 %
Eosinophils Absolute: 0.2 10*3/uL (ref 0.0–0.5)
Eosinophils Relative: 3 %
HCT: 42.2 % (ref 36.0–46.0)
Hemoglobin: 13.7 g/dL (ref 12.0–15.0)
Immature Granulocytes: 0 %
Lymphocytes Relative: 18 %
Lymphs Abs: 1.3 10*3/uL (ref 0.7–4.0)
MCH: 30 pg (ref 26.0–34.0)
MCHC: 32.5 g/dL (ref 30.0–36.0)
MCV: 92.3 fL (ref 80.0–100.0)
Monocytes Absolute: 0.6 10*3/uL (ref 0.1–1.0)
Monocytes Relative: 9 %
Neutro Abs: 4.9 10*3/uL (ref 1.7–7.7)
Neutrophils Relative %: 69 %
Platelets: 179 10*3/uL (ref 150–400)
RBC: 4.57 MIL/uL (ref 3.87–5.11)
RDW: 13.2 % (ref 11.5–15.5)
WBC: 7 10*3/uL (ref 4.0–10.5)
nRBC: 0 % (ref 0.0–0.2)

## 2023-04-21 LAB — URINALYSIS, ROUTINE W REFLEX MICROSCOPIC
Bilirubin Urine: NEGATIVE
Glucose, UA: NEGATIVE mg/dL
Hgb urine dipstick: NEGATIVE
Ketones, ur: NEGATIVE mg/dL
Leukocytes,Ua: NEGATIVE
Nitrite: NEGATIVE
Protein, ur: NEGATIVE mg/dL
Specific Gravity, Urine: 1.009 (ref 1.005–1.030)
pH: 7.5 (ref 5.0–8.0)

## 2023-04-21 LAB — MAGNESIUM: Magnesium: 2 mg/dL (ref 1.7–2.4)

## 2023-04-21 LAB — TROPONIN I (HIGH SENSITIVITY): Troponin I (High Sensitivity): 4 ng/L (ref <18)

## 2023-04-21 MED ORDER — ONDANSETRON HCL 4 MG/2ML IJ SOLN
4.0000 mg | Freq: Once | INTRAMUSCULAR | Status: AC
  Administered 2023-04-21: 4 mg via INTRAVENOUS
  Filled 2023-04-21: qty 2

## 2023-04-21 MED ORDER — DIAZEPAM 5 MG/ML IJ SOLN
2.5000 mg | Freq: Once | INTRAMUSCULAR | Status: AC
  Administered 2023-04-21: 2.5 mg via INTRAVENOUS
  Filled 2023-04-21: qty 2

## 2023-04-21 MED ORDER — MECLIZINE HCL 25 MG PO TABS: 25.0000 mg | ORAL_TABLET | Freq: Three times a day (TID) | ORAL | 0 refills | Status: DC | PRN

## 2023-04-21 MED ORDER — AMLODIPINE BESYLATE 5 MG PO TABS
2.5000 mg | ORAL_TABLET | Freq: Every day | ORAL | Status: DC
  Administered 2023-04-21: 2.5 mg via ORAL
  Filled 2023-04-21 (×2): qty 1

## 2023-04-21 MED ORDER — ONDANSETRON 4 MG PO TBDP: 4.0000 mg | ORAL_TABLET | Freq: Three times a day (TID) | ORAL | 0 refills | Status: AC | PRN

## 2023-04-21 MED ORDER — METOCLOPRAMIDE HCL 5 MG/ML IJ SOLN
10.0000 mg | Freq: Once | INTRAMUSCULAR | Status: AC
Start: 2023-04-21 — End: 2023-04-21
  Administered 2023-04-21: 10 mg via INTRAVENOUS
  Filled 2023-04-21: qty 2

## 2023-04-21 NOTE — ED Triage Notes (Signed)
Pt c/o dizziness with nausea x 5 days upon waking. speech clear, ao x 4

## 2023-04-21 NOTE — ED Provider Notes (Signed)
Stephanie Aguilar EMERGENCY DEPARTMENT AT [facility] Provider Note   CSN: [id number] Arrival date & time: 04/21/23  7829     History  Chief Complaint  Patient presents with   Dizziness    Stephanie Aguilar is a 83 y.o. female with PMH as listed below who presents with  c/o dizziness with nausea x 5 days upon waking and getting up in the morning. Room spins around her for a short time. Happened also yesterday while hanging a photo and she had to sit down. Seems to be worse with rolling over in bed. No h/o similar. No numbness/tingling, headache, slurred speech, or confusion. No falls/head trauma, f/c, facial pain, ear pain, tinnitus, hearing loss. No recent changes to medications. No alcohol or drugs. Sometimes feels palpitations early in the morning for the last several months but doesn't notice that they happen at the same time as the vertigo and this seems to be separate.  Past Medical History:  Diagnosis Date   Allergy    Anxiety    Cataract    Depression    Hyperlipidemia    Hypertension        Home Medications Prior to Admission medications   Medication Sig Start Date End Date Taking? Authorizing Provider  meclizine (ANTIVERT) 25 MG tablet Take 1 tablet (25 mg total) by mouth 3 (three) times daily as needed for dizziness. 04/21/23  Yes Loetta Rough, MD  ondansetron (ZOFRAN-ODT) 4 MG disintegrating tablet Take 1 tablet (4 mg total) by mouth every 8 (eight) hours as needed for nausea or vomiting. 04/21/23  Yes Loetta Rough, MD  acebutolol (SECTRAL) 200 MG capsule Take 1 capsule (200 mg total) by mouth daily. 12/31/22 12/26/23  Georgina Quint, MD  amLODipine (NORVASC) 2.5 MG tablet TAKE 1 TABLET EACH DAY. 12/31/22   Georgina Quint, MD  aspirin 81 MG tablet Take 81 mg by mouth daily.    [provider]  Cholecalciferol (VITAMIN D) 2000 units CAPS Take 1 capsule by mouth daily.    [provider]  clobetasol (TEMOVATE) 0.05 % external  solution Apply 10 drops to scalp every night at bedtime. 04/11/15   Collene Gobble, MD  cycloSPORINE (RESTASIS) 0.05 % ophthalmic emulsion 1 drop 2 (two) times daily.    [provider]  desloratadine (CLARINEX) 5 MG tablet Take 1 tablet (5 mg total) by mouth daily. as directed 12/31/22   Georgina Quint, MD  fluticasone [facility]) 50 MCG/ACT nasal spray USE 2 SPRAYS INTO EACH NOSTRIL DAILY 09/20/14   Collene Gobble, MD  LORazepam (ATIVAN) 0.5 MG tablet Take 1 tablet (0.5 mg total) by mouth 2 (two) times daily as needed for anxiety. 12/31/22   Georgina Quint, MD  METRONIDAZOLE, TOPICAL, 0.75 % LOTN APPLY TOPICALLY TO AFFECTED AREA. 09/21/15   Collene Gobble, MD  olmesartan (BENICAR) 40 MG tablet Take 1 tablet (40 mg total) by mouth daily. 12/31/22   Georgina Quint, MD  OVER THE COUNTER MEDICATION OTC Imodium taking daily for diarrrhea    [provider]  rosuvastatin (CRESTOR) 5 MG tablet Take 0.5 tablets (2.5 mg total) by mouth daily. 12/31/22 12/26/23  Georgina Quint, MD  VIVELLE-DOT 0.0375 MG/24HR  09/05/14   [provider]      Allergies    Codeine, Erythromycin, Latex, Lisinopril, and Sulfa antibiotics    Review of Systems   Review of Systems A 10 point review of systems was performed and is negative unless otherwise reported  in HPI.  Physical Exam Updated Vital Signs BP (!) 153/60   Pulse (!) 56   Temp 98.1 F (36.7 C) (Oral)   Resp 13   SpO2 100%  Physical Exam General: Normal appearing female, lying in bed.  HEENT: PERRLA, EOMI, Sclera anicteric, MMM, trachea midline. Normal BL TMs with no cerumen impaction.  Cardiology: RRR, no murmurs/rubs/gallops.  Resp: Normal respiratory rate and effort. CTAB, no wheezes, rhonchi, crackles.  Abd: Soft, non-tender, non-distended. No rebound tenderness or guarding.  GU: Deferred. MSK: No peripheral edema or signs of trauma. Extremities without deformity or TTP. No cyanosis or clubbing. Skin:  warm, dry. Neuro: A&Ox4, CNs II-XII grossly intact. MAEs. Sensation grossly intact. Dix Hallpike negative to the right, but overtly positive to the left. Torsional nystagmus present and vomiting induced with dix hallpike.  Psych: Normal mood and affect.   ED Results / Procedures / Treatments   Labs (all labs ordered are listed, but only abnormal results are displayed) Labs Reviewed  COMPREHENSIVE METABOLIC PANEL - Abnormal; Notable for the following components:      Result Value   Glucose, Bld 107 (*)    All other components within normal limits  URINALYSIS, ROUTINE W REFLEX MICROSCOPIC - Abnormal; Notable for the following components:   Color, Urine COLORLESS (*)    All other components within normal limits  CBC WITH DIFFERENTIAL/PLATELET  MAGNESIUM  TROPONIN I (HIGH SENSITIVITY)    EKG EKG Interpretation Date/Time:  Monday April 21 2023 09:27:39 EST Ventricular Rate:  55 PR Interval:  155 QRS Duration:  84 QT Interval:  443 QTC Calculation: 424 R Axis:   78  Text Interpretation: Sinus rhythm Probable left atrial enlargement Abnormal T waves in lateral leads, worse than on prior EKG Confirmed by Vivi Barrack [phone]) on 04/21/2023 10:16:19 AM  Radiology No results found.  Procedures Procedures    Medications Ordered in ED Medications  metoCLOPramide (REGLAN) injection 10 mg (has no administration in time range)  diazepam (VALIUM) injection 2.5 mg (has no administration in time range)  ondansetron (ZOFRAN) injection 4 mg (4 mg Intravenous Given 04/21/23 1047)  diazepam (VALIUM) injection 2.5 mg (2.5 mg Intravenous Given 04/21/23 1117)  diazepam (VALIUM) injection 2.5 mg (2.5 mg Intravenous Given 04/21/23 1310)    ED Course/ Medical Decision Making/ A&P                          Medical Decision Making Amount and/or Complexity of Data Reviewed Labs: ordered. Decision-making details documented in ED Course.  Risk Prescription drug management. Decision regarding  hospitalization.    This patient presents to the ED for concern of paroxysmal positional vertigo, this involves an extensive number of treatment options, and is a complaint that carries with it a high risk of complications and morbidity.  I considered the following differential and admission for this acute, potentially life threatening condition.   MDM:    If peripheral vertigo, consider BPPV most likely diagnosis with positional vertigo and torsional nystagmus induced with dix hallpike maneuver. Lower c/f meniere's syndrome, labyrinthitis, vestibular neuritis, ear foreign body. Lower degree of concern for central cause of vertigo as well including posterior stroke, brain tumor, vertebral artery dissection, basilar migraine, vertebrobasilar insufficiency. She has no e/o anemia, hyperviscosity syndrome, hyper/hypoglycemia.  Her EKG shows some T wave inversions in lateral leads that are worse than prior EKG in 2024, she does not have any chest pain but will obtain a single troponin to rule out ACS.  Clinical Course as of 04/21/23 1616  Mon Apr 21, 2023  1012 CBC with Differential wnl [HN]  1054 Troponin I (High Sensitivity): 4 neg [HN]  1054 Comprehensive metabolic panel(!) Unremarkable in the context of this patient's presentation  [HN]  1054 Magnesium: 2.0 wnl [HN]  1103 Patient continuing to vomit.  Will give Valium and attempt Epley maneuver. [HN]  1203 Given Valium which helped initially.  Then attempted Epley maneuver and patient still has significant intermittent dizziness and vomiting.  This freestanding ED does not stop scopolamine patches.  Will give additional Valium. [HN]  1429 Urinalysis, Routine w reflex microscopic -Urine, Clean Catch(!) neg [HN]  1544 Reevaluated patient after medications.  She would prefer to be discharged and try managing her symptoms at home.  We discussed Zofran and meclizine at home with vestibular PT and PCP follow-up.  On attempted discharge, patient sat  up, turned her head, and became very vertiginous and profusely vomited.  She is hesitant to move at all for worsening of her symptoms.  Discussed with patient and her husband who then agree that symptom control inpatient would be better for her.  Will give valium/reglan and consult for admission. [HN]    Clinical Course User Index [HN] Loetta Rough, MD    Labs: I Ordered, and personally interpreted labs.  The pertinent results include:  those listed above  Additional history obtained from husband at bedside, chart review.    Cardiac Monitoring: The patient was maintained on a cardiac monitor.  I personally viewed and interpreted the cardiac monitored which showed an underlying rhythm of: NSR  Reevaluation: After the interventions noted above, I reevaluated the patient and found that they have :improved  Social Determinants of Health: Lives independently  Disposition: Admit for vertigo  Co morbidities that complicate the patient evaluation  Past Medical History:  Diagnosis Date   Allergy    Anxiety    Cataract    Depression    Hyperlipidemia    Hypertension      Medicines Meds ordered this encounter  Medications   ondansetron (ZOFRAN) injection 4 mg   diazepam (VALIUM) injection 2.5 mg   diazepam (VALIUM) injection 2.5 mg   ondansetron (ZOFRAN-ODT) 4 MG disintegrating tablet    Sig: Take 1 tablet (4 mg total) by mouth every 8 (eight) hours as needed for nausea or vomiting.    Dispense:  20 tablet    Refill:  0   meclizine (ANTIVERT) 25 MG tablet    Sig: Take 1 tablet (25 mg total) by mouth 3 (three) times daily as needed for dizziness.    Dispense:  30 tablet    Refill:  0   metoCLOPramide (REGLAN) injection 10 mg   diazepam (VALIUM) injection 2.5 mg    I have reviewed the patients home medicines and have made adjustments as needed  Problem List / ED Course: Problem List Items Addressed This Visit   None Visit Diagnoses       Benign paroxysmal positional  vertigo of left ear    -  Primary                   This note was created using dictation software, which may contain spelling or grammatical errors.    Loetta Rough, MD 04/21/23 [phone]

## 2023-04-21 NOTE — ED Notes (Addendum)
Error on previous note. Pt's husband is at bedside and aware that she will be spending the night here.

## 2023-04-21 NOTE — Discharge Instructions (Addendum)
Thank you for coming to [facility] Emergency Department. You were seen for dizziness/vertigo. We did an exam, labs, and this showed likely benign paroxysmal positional vertigo (BPPV).  Your labs and urine were reassuring.  We treated you with medications here IV here in the ED and you somewhat improved.  You were offered further management and admission to the hospital but you elected to be discharged.  We have prescribed medication for nausea which is Zofran under the tongue 4 mg every 6-8 hours as needed for nausea vomiting as well as meclizine 25 mg up to 3 times daily for dizziness.  Please do not drive while you are experiencing this vertigo.  Please do not soak in a body of water or climb height.  We have sent a referral for vestibular physical therapy. Please follow up with your primary care provider within 1 week.   Do not hesitate to return to the ED or call 911 if you experience: -Worsening symptoms -Strokelike symptoms including numbness tingling, asymmetric weakness, slurred speech, confusion -Falls or head trauma -Lightheadedness, passing out -Fevers/chills -Anything else that concerns you

## 2023-04-21 NOTE — ED Notes (Signed)
ED Provider at bedside. 

## 2023-04-21 NOTE — ED Provider Notes (Signed)
  Physical Exam  BP (!) 153/60   Pulse (!) 56   Temp 98.1 F (36.7 C) (Oral)   Resp 13   SpO2 100%   Physical Exam  Procedures  Procedures  ED Course / MDM   Clinical Course as of 04/21/23 1612  Mon Apr 21, 2023  1012 CBC with Differential wnl [HN]  1054 Troponin I (High Sensitivity): 4 neg [HN]  1054 Comprehensive metabolic panel(!) Unremarkable in the context of this patient's presentation  [HN]  1054 Magnesium: 2.0 wnl [HN]  1103 Patient continuing to vomit.  Will give Valium and attempt Epley maneuver. [HN]  1203 Given Valium which helped initially.  Then attempted Epley maneuver and patient still has significant intermittent dizziness and vomiting.  This freestanding ED does not stop scopolamine patches.  Will give additional Valium. [HN]  1429 Urinalysis, Routine w reflex microscopic -Urine, Clean Catch(!) neg [HN]  1544 Reevaluated patient after medications.  She would prefer to be discharged and try managing her symptoms at home.  We discussed Zofran and meclizine at home with vestibular PT and PCP follow-up.  On attempted discharge, patient sat up, turned her head, and became very vertiginous and profusely vomited.  She is hesitant to move at all for worsening of her symptoms.  Discussed with patient and her husband who then agree that symptom control inpatient would be better for her.  Will consult for admission. [HN]    Clinical Course User Index [HN] Loetta Rough, MD   Medical Decision Making Amount and/or Complexity of Data Reviewed Labs: ordered. Decision-making details documented in ED Course.  Risk Prescription drug management. Decision regarding hospitalization.   Assuming care from Dr. Jearld Fenton. Pt comes in with  cc of vertigo.  Symptoms are peripheral in nature. Has peripheral vertigo. Failed po attempt, continues to be dizzy - will need admission.        Derwood Kaplan, MD 04/21/23 [phone]

## 2023-04-21 NOTE — ED Notes (Signed)
Attempted to dc patient, as she did not want to be admitted at this time. Pt sitting up in the bed, vomiting and stating she feels dizzy again. Wants to attempt walking to the bathroom and will re-evaluate if she changed her mind on being admitted. MD notified and speaking with patient at this time.

## 2023-04-22 ENCOUNTER — Observation Stay (HOSPITAL_COMMUNITY): Payer: Medicare Other

## 2023-04-22 DIAGNOSIS — R42 Dizziness and giddiness: Secondary | ICD-10-CM | POA: Diagnosis present

## 2023-04-22 DIAGNOSIS — Z7982 Long term (current) use of aspirin: Secondary | ICD-10-CM | POA: Diagnosis not present

## 2023-04-22 DIAGNOSIS — Z79899 Other long term (current) drug therapy: Secondary | ICD-10-CM | POA: Diagnosis not present

## 2023-04-22 DIAGNOSIS — H8112 Benign paroxysmal vertigo, left ear: Secondary | ICD-10-CM | POA: Diagnosis not present

## 2023-04-22 DIAGNOSIS — I10 Essential (primary) hypertension: Secondary | ICD-10-CM | POA: Diagnosis not present

## 2023-04-22 DIAGNOSIS — H81392 Other peripheral vertigo, left ear: Secondary | ICD-10-CM

## 2023-04-22 DIAGNOSIS — Z91040 Latex allergy status: Secondary | ICD-10-CM | POA: Diagnosis not present

## 2023-04-22 DIAGNOSIS — E785 Hyperlipidemia, unspecified: Secondary | ICD-10-CM | POA: Diagnosis not present

## 2023-04-22 LAB — TSH: TSH: 3.289 u[IU]/mL (ref 0.350–4.500)

## 2023-04-22 LAB — CBC
HCT: 44.9 % (ref 36.0–46.0)
Hemoglobin: 14.8 g/dL (ref 12.0–15.0)
MCH: 30.1 pg (ref 26.0–34.0)
MCHC: 33 g/dL (ref 30.0–36.0)
MCV: 91.3 fL (ref 80.0–100.0)
Platelets: 177 10*3/uL (ref 150–400)
RBC: 4.92 MIL/uL (ref 3.87–5.11)
RDW: 13.2 % (ref 11.5–15.5)
WBC: 7.3 10*3/uL (ref 4.0–10.5)
nRBC: 0 % (ref 0.0–0.2)

## 2023-04-22 LAB — BASIC METABOLIC PANEL
Anion gap: 7 (ref 5–15)
BUN: 15 mg/dL (ref 8–23)
CO2: 27 mmol/L (ref 22–32)
Calcium: 9.5 mg/dL (ref 8.9–10.3)
Chloride: 106 mmol/L (ref 98–111)
Creatinine, Ser: 1.07 mg/dL — ABNORMAL HIGH (ref 0.44–1.00)
GFR, Estimated: 52 mL/min — ABNORMAL LOW (ref >60)
Glucose, Bld: 97 mg/dL (ref 70–99)
Potassium: 3.9 mmol/L (ref 3.5–5.1)
Sodium: 140 mmol/L (ref 135–145)

## 2023-04-22 LAB — PHOSPHORUS: Phosphorus: 3.6 mg/dL (ref 2.5–4.6)

## 2023-04-22 LAB — MAGNESIUM: Magnesium: 2.1 mg/dL (ref 1.7–2.4)

## 2023-04-22 MED ORDER — ENOXAPARIN SODIUM 40 MG/0.4ML IJ SOSY
40.0000 mg | PREFILLED_SYRINGE | INTRAMUSCULAR | Status: DC
Start: 2023-04-22 — End: 2023-04-22
  Administered 2023-04-22: 40 mg via SUBCUTANEOUS
  Filled 2023-04-22: qty 0.4

## 2023-04-22 MED ORDER — ACEBUTOLOL HCL 200 MG PO CAPS
200.0000 mg | ORAL_CAPSULE | Freq: Every day | ORAL | Status: DC
  Administered 2023-04-22 – 2023-04-23 (×2): 200 mg via ORAL
  Filled 2023-04-22 (×3): qty 1

## 2023-04-22 MED ORDER — POLYETHYLENE GLYCOL 3350 17 G PO PACK: 17.0000 g | PACK | Freq: Every day | ORAL | Status: DC | PRN

## 2023-04-22 MED ORDER — MECLIZINE HCL 12.5 MG PO TABS
12.5000 mg | ORAL_TABLET | Freq: Three times a day (TID) | ORAL | Status: DC
  Administered 2023-04-22 – 2023-04-23 (×3): 12.5 mg via ORAL
  Filled 2023-04-22 (×5): qty 1

## 2023-04-22 MED ORDER — HYDRALAZINE HCL 20 MG/ML IJ SOLN: 10.0000 mg | Freq: Four times a day (QID) | INTRAMUSCULAR | Status: DC | PRN

## 2023-04-22 MED ORDER — ONDANSETRON HCL 4 MG PO TABS: 4.0000 mg | ORAL_TABLET | Freq: Four times a day (QID) | ORAL | Status: DC | PRN

## 2023-04-22 MED ORDER — ACETAMINOPHEN 325 MG PO TABS: 650.0000 mg | ORAL_TABLET | Freq: Four times a day (QID) | ORAL | Status: DC | PRN

## 2023-04-22 MED ORDER — ASPIRIN 81 MG PO TBEC
81.0000 mg | DELAYED_RELEASE_TABLET | Freq: Every day | ORAL | Status: DC
  Administered 2023-04-22 – 2023-04-23 (×2): 81 mg via ORAL
  Filled 2023-04-22 (×2): qty 1

## 2023-04-22 MED ORDER — BISACODYL 5 MG PO TBEC
5.0000 mg | DELAYED_RELEASE_TABLET | Freq: Every day | ORAL | Status: DC | PRN
  Filled 2023-04-22: qty 1

## 2023-04-22 MED ORDER — ACETAMINOPHEN 650 MG RE SUPP: 650.0000 mg | Freq: Four times a day (QID) | RECTAL | Status: DC | PRN

## 2023-04-22 MED ORDER — ONDANSETRON HCL 4 MG/2ML IJ SOLN: 4.0000 mg | Freq: Four times a day (QID) | INTRAMUSCULAR | Status: DC | PRN

## 2023-04-22 MED ORDER — IRBESARTAN 300 MG PO TABS: 300.0000 mg | ORAL_TABLET | Freq: Every day | ORAL | Status: DC

## 2023-04-22 MED ORDER — AMLODIPINE BESYLATE 5 MG PO TABS
2.5000 mg | ORAL_TABLET | Freq: Every day | ORAL | Status: DC
  Administered 2023-04-22: 2.5 mg via ORAL
  Filled 2023-04-22: qty 1

## 2023-04-22 MED ORDER — ENOXAPARIN SODIUM 30 MG/0.3ML IJ SOSY
30.0000 mg | PREFILLED_SYRINGE | INTRAMUSCULAR | Status: DC
  Administered 2023-04-23: 30 mg via SUBCUTANEOUS
  Filled 2023-04-22: qty 0.3

## 2023-04-22 MED ORDER — ROSUVASTATIN CALCIUM 5 MG PO TABS
2.5000 mg | ORAL_TABLET | Freq: Every day | ORAL | Status: DC
  Administered 2023-04-22: 2.5 mg via ORAL
  Filled 2023-04-22: qty 1

## 2023-04-22 MED ORDER — ALBUTEROL SULFATE (2.5 MG/3ML) 0.083% IN NEBU: 2.5000 mg | INHALATION_SOLUTION | Freq: Four times a day (QID) | RESPIRATORY_TRACT | Status: DC | PRN

## 2023-04-22 MED ORDER — MECLIZINE HCL 12.5 MG PO TABS: 12.5000 mg | ORAL_TABLET | Freq: Three times a day (TID) | ORAL | Status: DC | PRN

## 2023-04-22 NOTE — Progress Notes (Cosign Needed)
Patient left the floor for MRI at 1230

## 2023-04-22 NOTE — Progress Notes (Signed)
Transition of Care [facility]) - Inpatient Brief Assessment   Patient Details  Name: Stephanie Aguilar MRN: [id number] Date of Birth: 03-17-1941  [facility] CM/SW Contact:    Janae Bridgeman, RN Phone Number: 04/22/2023, 11:01 AM   Clinical Narrative: CM met with the patient at the bedside to discuss TOC needs.  The patient lives with her husband at the home and plans to return when medically stable for discharge.  Patient spoke with PT this morning and OUtpatient Referral is needed to [facility].  Referral was placed in the hub and follow up provided in the AVS.  Patient was provided with [facility] letter at the bedside.  No other TOC needs and patient should return home when medically stable for discharge.   Transition of Care Asessment: Insurance and Status: Insurance coverage has been reviewed Patient has primary care physician: Yes Home environment has been reviewed: From home   Prior/Current Home Services: No current home services Social Drivers of Health Review: SDOH reviewed interventions complete Readmission risk has been reviewed: Yes Transition of care needs: (P) transition of care needs identified, TOC will continue to follow

## 2023-04-22 NOTE — Progress Notes (Signed)
Patient received on the floor at 0835 AM by PTAR. Alert and oriented. On RA. Connected to cardiac monitor box 06. Will continue to monitor

## 2023-04-22 NOTE — Care Management Obs Status (Cosign Needed)
MEDICARE OBSERVATION STATUS NOTIFICATION   Patient Details  Name: Stephanie Aguilar MRN: [id number] Date of Birth: Dec 20, 1940   Medicare Observation Status Notification Given:  Yes    Janae Bridgeman, RN 04/22/2023, 11:00 AM

## 2023-04-22 NOTE — Evaluation (Addendum)
Physical Therapy Evaluation Patient Details Name: Stephanie Aguilar MRN: [id number] DOB: 30-Oct-1940 Today's Date: 04/22/2023  History of Present Illness  Patient is an 83 y/o female admitted 04/21/23 with dizziness and nausea.  Felt to be due to peripheral cause.  PMH positive for HLD, HTN, cataract, depression, anxiety.  Clinical Impression  Patient presents with decreased mobility due to imbalance and symptoms related to dizziness.  She normally is very active completing household chores, driving, gardening and shoveling snow recently.  She was able to sit up unaided, needing HHA to walk to bathroom and back though stood at sink to wash hands unaided.  Vestibular testing completed as noted below.  Positive for L posterior canal BPPV and treated with Eply x 1.  She declined further testing/treatment.  Outpatient PT follow up recommended for vestibular rehab.  Will follow up later if time allows to re-check for BPPV.   May need RW initially for home use.       Vestibular Assessment - 04/22/23 0001       Symptom Behavior   Subjective history of current problem started a few days ago, hung picture at daughters and felt a little "twinge" then that night had full episode of vertigo, more nauseated since ED physician tried to treat.    Type of Dizziness  Spinning;Imbalance;Vertigo    Frequency of Dizziness intermittent    Duration of Dizziness minutes    Symptom Nature Motion provoked;Intermittent    Aggravating Factors Rolling to left;Supine to sit    Relieving Factors Slow movements;Comments   not moving to L   Progression of Symptoms No change since onset    History of similar episodes None      Oculomotor Exam   Oculomotor Alignment Abnormal   L eye higher in orbit, no diplopia, wears bifocals all the time   Ocular ROM mildly decreased to lateral aspects    Spontaneous Absent    Gaze-induced  Absent    Head shaking Horizontal Absent    Smooth Pursuits Intact    Saccades Intact;Slow       Oculomotor Exam-Fixation Suppressed    Left Head Impulse positive for refixation    Right Head Impulse positive for refixation    Head Shaking Nystagmus-Horizontal negative      Vestibulo-Ocular Reflex   VOR 1 Head Only (x 1 viewing) intact though slow for horizontal and vertical head movements x 10 reps no symptoms      Auditory   Comments intact to scratch test bilateral and equal      Positional Testing   Dix-Hallpike Dix-Hallpike Right;Dix-Hallpike Left      Dix-Hallpike Right   Dix-Hallpike Right Duration 1 minute    Dix-Hallpike Right Symptoms No nystagmus      Dix-Hallpike Left   Dix-Hallpike Left Duration 1 minute    Dix-Hallpike Left Symptoms Upbeat, left rotatory nystagmus   lasting < 30 sec               If plan is discharge home, recommend the following: A little help with walking and/or transfers;Assist for transportation;Assistance with cooking/housework;A little help with bathing/dressing/bathroom;Help with stairs or ramp for entrance   Can travel by private vehicle        Equipment Recommendations Rolling walker (2 wheels) (youth)  Recommendations for Other Services       Functional Status Assessment Patient has had a recent decline in their functional status and demonstrates the ability to make significant improvements in function in a reasonable and predictable amount  of time.     Precautions / Restrictions Precautions Precautions: Fall      Mobility  Bed Mobility Overal bed mobility: Modified Independent                  Transfers Overall transfer level: Needs assistance Equipment used: 1 person hand held assist Transfers: Sit to/from Stand Sit to Stand: Min assist           General transfer comment: patient wanting to hold a hand for sit to stand for stability    Ambulation/Gait Ambulation/Gait assistance: Contact guard assist Gait Distance (Feet): 20 Feet Assistive device: 1 person hand held assist, None Gait  Pattern/deviations: Step-through pattern, Decreased stride length       General Gait Details: imbalance felt and pt wanting to hold a hand, CGA for balance  Stairs            Wheelchair Mobility     Tilt Bed    Modified Rankin (Stroke Patients Only)       Balance Overall balance assessment: Needs assistance   Sitting balance-Leahy Scale: Good       Standing balance-Leahy Scale: Fair Standing balance comment: washing hands at sink without UE support                             Pertinent Vitals/Pain Pain Assessment Pain Assessment: No/denies pain    Home Living Family/patient expects to be discharged to:: Private residence Living Arrangements: Spouse/significant other Available Help at Discharge: Family Type of Home: House Home Access: Stairs to enter     Alternate Level Stairs-Number of Steps: flight Home Layout: Two level Home Equipment: None      Prior Function Prior Level of Function : Independent/Modified Independent             Mobility Comments: very active with gardening, shoveling snow last week, hanging picture for her daughter       Extremity/Trunk Assessment   Upper Extremity Assessment Upper Extremity Assessment: Overall WFL for tasks assessed    Lower Extremity Assessment Lower Extremity Assessment: Overall WFL for tasks assessed       Communication   Communication Communication: No apparent difficulties  Cognition Arousal: Alert Behavior During Therapy: WFL for tasks assessed/performed Overall Cognitive Status: Within Functional Limits for tasks assessed                                          General Comments General comments (skin integrity, edema, etc.): Completed Vestibular evaluation and performed DHP positive for L posterior canal BPPV and treated with Eply maneuver x 1; educated on typical symptoms post maneuver ~ 48 hours and need for support for balance and using visual system to  compensate for impaired vestibular system.    Exercises     Assessment/Plan    PT Assessment Patient needs continued PT services  PT Problem List Other (comment);Impaired sensation;Decreased mobility;Decreased balance;Decreased knowledge of use of DME (dizziness)       PT Treatment Interventions DME instruction;Gait training;Stair training;Functional mobility training;Therapeutic activities;Therapeutic exercise;Balance training;Canalith reposition;Patient/family education;Neuromuscular re-education    PT Goals (Current goals can be found in the Care Plan section)  Acute Rehab PT Goals Patient Stated Goal: return to independent PT Goal Formulation: With patient Time For Goal Achievement: 04/29/23 Potential to Achieve Goals: Good    Frequency Min 1X/week  Co-evaluation               AM-PAC PT "6 Clicks" Mobility  Outcome Measure Help needed turning from your back to your side while in a flat bed without using bedrails?: A Little Help needed moving from lying on your back to sitting on the side of a flat bed without using bedrails?: A Little Help needed moving to and from a bed to a chair (including a wheelchair)?: A Little Help needed standing up from a chair using your arms (e.g., wheelchair or bedside chair)?: A Little Help needed to walk in hospital room?: A Little Help needed climbing 3-5 steps with a railing? : A Little 6 Click Score: 18    End of Session   Activity Tolerance: Patient tolerated treatment well Patient left: in bed;with call bell/phone within reach   PT Visit Diagnosis: BPPV;Other abnormalities of gait and mobility (R26.89) BPPV - Right/Left : Left    Time: 1610-9604 PT Time Calculation (min) (ACUTE ONLY): 35 min   Charges:   PT Evaluation $PT Eval Moderate Complexity: 1 Mod PT Treatments $Canalith Rep Proc: 8-22 mins PT General Charges $$ ACUTE PT VISIT: 1 Visit         Sheran Lawless, PT Acute Rehabilitation  Services [phone] 04/22/2023   Elray Mcgregor 04/22/2023, 11:15 AM

## 2023-04-22 NOTE — Progress Notes (Cosign Needed)
Patient has returned to the floor at 1322. Patient is in room expressed no pain. Plan of care ongoing.

## 2023-04-22 NOTE — H&P (Signed)
[facility] History and Physical  Stephanie Aguilar ZOX:[id number] DOB: Oct 04, 1940 DOA: 04/21/2023 PCP: Georgina Quint, MD  Presented from: Home Chief Complaint: Dizziness  History of Present Illness: Stephanie Aguilar is a 83 y.o. female with PMH significant for HTN, HLD, anxiety/depression. Patient presented to the ED at [facility] yesterday 1/20 with complaint of dizziness with nausea upon getting up in the morning.  She has been experiencing it for the last 5 days every morning.  She feels the room spins around her for a short time.  The day prior to presentation, it happened when she was having a photo as he had to sit down.  It got more frequent to the point where she was getting dizzy every time she moved her head.  She had couple of episodes of vomiting as well and hence presented to ED. No history of trauma, fall, recent diarrhea, dehydration, bleeding. Occasional alcohol use.  Does not use any prescription pain medicines. Patient also reported intermittent palpitation in the morning that started several months ago.  In the ED, patient was afebrile, heart rate in 50s, blood pressure in 150s mostly. Initial labs mostly unremarkable On exam by EDP, Dix-Hallpike negative to right with positive.  Torsional nystagmus was present and vomiting was induced with Dix-Hallpike. Patient was given IV Valium, IV Reglan which had to be repeated multiple times while waiting for bed availability at [facility].  I evaluated the patient after she arrived to [facility] this morning. Patient was alert, awake, oriented x 3 and able to give the details of her history. At baseline, patient is very active inside the house and in her yards. Repeat labs this morning unremarkable MRI brain did not show any acute intracranial abnormality, it showed mild chronic small vessel ischemic disease.   Review of Systems:  All systems were reviewed and were negative unless otherwise mentioned  in the HPI   Past medical history: Past Medical History:  Diagnosis Date   Allergy    Anxiety    Cataract    Depression    Hyperlipidemia    Hypertension     Past surgical history: Past Surgical History:  Procedure Laterality Date   ABDOMINAL HYSTERECTOMY     APPENDECTOMY     BREAST SURGERY     DENTAL SURGERY     TONSILLECTOMY AND ADENOIDECTOMY      Social History:  reports that she has never smoked. She has never used smokeless tobacco. She reports current alcohol use. She reports that she does not use drugs.  Allergies:  Allergies  Allergen Reactions   Lactose Intolerance (Gi) Diarrhea   Codeine    Erythromycin     Sick on stomach   Latex    Lisinopril    Sulfa Antibiotics     rash   Lactose intolerance (gi), Codeine, Erythromycin, Latex, Lisinopril, and Sulfa antibiotics   Family history:  Family History  Problem Relation Age of Onset   Hypertension Mother    Alcohol abuse Father    COPD Brother    Hypertension Brother    Alcohol abuse Brother    Heart disease Maternal Grandmother    Cancer Maternal Grandfather    Stroke Paternal Grandmother    Gout Brother    Alcohol abuse Brother      Physical Exam: Vitals:   04/22/23 0844 04/22/23 1127 04/22/23 1130 04/22/23 1134  BP: (!) 160/60 (!) 151/57 (!) 152/67 (!) 146/64  Pulse: (!) 52 (!) 50 (!) 56 62  Resp:  14 16 16   Temp: 97.8 F (36.6 C) 98 F (36.7 C)    TempSrc: Oral Oral    SpO2: 99% 99% 99% 100%  Weight: 46.9 kg 46.9 kg    Height:  4\' 11"  (1.499 m)     Wt Readings from Last 3 Encounters:  04/22/23 46.9 kg  12/31/22 44.5 kg  05/27/22 44.7 kg   Body mass index is 20.88 kg/m.  General exam: Pleasant, elderly Caucasian female Skin: No rashes, lesions or ulcers. HEENT: Atraumatic, normocephalic, no obvious bleeding Lungs: Clear to auscultation bilaterally,  CVS: S1, S2, no murmur,   GI/Abd: Soft, nontender, nondistended, bowel sound present,   CNS: Alert, awake, oriented x  3 Psychiatry: Mood appropriate,  Extremities: No pedal edema, no calf tenderness,    ----------------------------------------------------------------------------------------------------------------------------------------- ----------------------------------------------------------------------------------------------------------------------------------------- -----------------------------------------------------------------------------------------------------------------------------------------  Assessment/Plan: Principal Problem:   Peripheral vertigo involving left ear  BPPV Presented with intermittent dizziness on changing position, progressively worsening for last 5 days Dix-Hallpike maneuver positive in the ED Seen by PT as well this morning.  Findings positive for left posterior canal BPPV.  Epley's maneuver done by PT. Patient needs outpatient vestibular follow-up And started her on meclizine 12.5 mg 3 times daily scheduled.  Monitor symptoms IV Zofran for nausea.  Tolerated liquid diet this morning.  Advance to regular consistency diet MRI brain done to rule out posterior circulation stroke.  No acute intracranial pathology noted.  Mild chronic small vessel ischemic disease noted  Hypertension PTA meds- acebutolol 200 mg daily, amlodipine 2.5 mg nightly, olmesartan 40 mg daily Resume acebutolol and amlodipine. Keep olmesartan on hold for now. Continue to monitor blood pressure.  Monitor for orthostatic blood pressure drop  Hyperlipidemia  Mild chronic small vessel ischemic disease PTA meds- aspirin 81 mg daily, Crestor 2.5 mg daily Continue all  Anxiety/depression Continue Ativan 0.5 mg twice daily as needed.  Mobility: Encourage ambulation  Goals of care:   Code Status: Full Code    DVT prophylaxis:  enoxaparin (LOVENOX) injection 30 mg Start: 04/23/23 1000   Antimicrobials: None Fluid: None Consultants: None Family Communication: None at bedside  Dispo: The  patient is from: Home              Anticipated d/c is to: Hopefully home in 1 to 2 days  Diet: Diet Order             Diet Heart Room service appropriate? Yes; Fluid consistency: Thin  Diet effective now                    ------------------------------------------------------------------------------------- Severity of Illness: The appropriate patient status for this patient is OBSERVATION. Observation status is judged to be reasonable and necessary in order to provide the required intensity of service to ensure the patient's safety. The patient's presenting symptoms, physical exam findings, and initial radiographic and laboratory data in the context of their medical condition is felt to place them at decreased risk for further clinical deterioration. Furthermore, it is anticipated that the patient will be medically stable for discharge from the hospital within 2 midnights of admission.  -------------------------------------------------------------------------------------   Home Meds: Prior to Admission medications   Medication Sig Start Date End Date Taking? Authorizing Provider  DOTTI 0.025 MG/24HR Place 1 patch onto the skin 2 (two) times a week. 04/14/23  Yes [provider]  meclizine (ANTIVERT) 25 MG tablet Take 1 tablet (25 mg total) by mouth 3 (three) times daily as needed for dizziness. 04/21/23  Yes Loetta Rough, MD  ondansetron (  ZOFRAN-ODT) 4 MG disintegrating tablet Take 1 tablet (4 mg total) by mouth every 8 (eight) hours as needed for nausea or vomiting. 04/21/23  Yes Loetta Rough, MD  acebutolol (SECTRAL) 200 MG capsule Take 1 capsule (200 mg total) by mouth daily. 12/31/22 12/26/23  Georgina Quint, MD  amLODipine (NORVASC) 2.5 MG tablet TAKE 1 TABLET EACH DAY. 12/31/22   Georgina Quint, MD  aspirin 81 MG tablet Take 81 mg by mouth daily.    [provider]  Cholecalciferol (VITAMIN D) 2000 units CAPS Take 1 capsule by mouth daily.     [provider]  clobetasol (TEMOVATE) 0.05 % external solution Apply 10 drops to scalp every night at bedtime. 04/11/15   Collene Gobble, MD  cycloSPORINE (RESTASIS) 0.05 % ophthalmic emulsion 1 drop 2 (two) times daily.    [provider]  desloratadine (CLARINEX) 5 MG tablet Take 1 tablet (5 mg total) by mouth daily. as directed 12/31/22   Georgina Quint, MD  fluticasone [facility]) 50 MCG/ACT nasal spray USE 2 SPRAYS INTO EACH NOSTRIL DAILY 09/20/14   Collene Gobble, MD  LORazepam (ATIVAN) 0.5 MG tablet Take 1 tablet (0.5 mg total) by mouth 2 (two) times daily as needed for anxiety. 12/31/22   Georgina Quint, MD  METRONIDAZOLE, TOPICAL, 0.75 % LOTN APPLY TOPICALLY TO AFFECTED AREA. 09/21/15   Collene Gobble, MD  olmesartan (BENICAR) 40 MG tablet Take 1 tablet (40 mg total) by mouth daily. 12/31/22   Georgina Quint, MD  OVER THE COUNTER MEDICATION OTC Imodium taking daily for diarrrhea    [provider]  rosuvastatin (CRESTOR) 5 MG tablet Take 0.5 tablets (2.5 mg total) by mouth daily. 12/31/22 12/26/23  Georgina Quint, MD  VIVELLE-DOT 0.0375 MG/24HR  09/05/14   [provider]    Labs on Admission:   CBC: Recent Labs  Lab 04/21/23 0957 04/22/23 0940  WBC 7.0 7.3  NEUTROABS 4.9  --   HGB 13.7 14.8  HCT 42.2 44.9  MCV 92.3 91.3  PLT 179 177    Basic Metabolic Panel: Recent Labs  Lab 04/21/23 0957 04/22/23 0940  NA 138 140  K 4.0 3.9  CL 104 106  CO2 29 27  GLUCOSE 107* 97  BUN 23 15  CREATININE 0.95 1.07*  CALCIUM 9.9 9.5  MG 2.0 2.1  PHOS  --  3.6    Liver Function Tests: Recent Labs  Lab 04/21/23 0957  AST 17  ALT 14  ALKPHOS 73  BILITOT 0.6  PROT 6.8  ALBUMIN 4.2   No results for input(s): "LIPASE", "AMYLASE" in the last 168 hours. No results for input(s): "AMMONIA" in the last 168 hours.  Cardiac Enzymes: No results for input(s): "CKTOTAL", "CKMB", "CKMBINDEX", "TROPONINI" in the last 168  hours.  BNP (last 3 results) No results for input(s): "BNP" in the last 8760 hours.  ProBNP (last 3 results) No results for input(s): "PROBNP" in the last 8760 hours.  CBG: No results for input(s): "GLUCAP" in the last 168 hours.  Lipase     Component Value Date/Time   LIPASE 18 05/19/2022 2204     Urinalysis    Component Value Date/Time   COLORURINE COLORLESS (A) 04/21/2023 0957   APPEARANCEUR CLEAR 04/21/2023 0957   LABSPEC 1.009 04/21/2023 0957   PHURINE 7.5 04/21/2023 0957   GLUCOSEU NEGATIVE 04/21/2023 0957   HGBUR NEGATIVE 04/21/2023 0957   BILIRUBINUR NEGATIVE 04/21/2023 0957   BILIRUBINUR negative 12/11/2016 0840  BILIRUBINUR Negative 03/15/2014 0914   KETONESUR NEGATIVE 04/21/2023 0957   PROTEINUR NEGATIVE 04/21/2023 0957   UROBILINOGEN 0.2 12/11/2016 0840   NITRITE NEGATIVE 04/21/2023 0957   LEUKOCYTESUR NEGATIVE 04/21/2023 0957     Drugs of Abuse  No results found for: "LABOPIA", "COCAINSCRNUR", "LABBENZ", "AMPHETMU", "THCU", "LABBARB"    Radiological Exams on Admission: MR BRAIN WO CONTRAST Result Date: 04/22/2023 CLINICAL DATA:  Dizziness with nausea for 5 days. EXAM: MRI HEAD WITHOUT CONTRAST TECHNIQUE: Multiplanar, multiecho pulse sequences of the brain and surrounding structures were obtained without intravenous contrast. COMPARISON:  None Available. FINDINGS: Brain: There is no evidence of an acute infarct, intracranial hemorrhage, mass, midline shift, or extra-axial fluid collection. T2 hyperintensities in the cerebral white matter are nonspecific but compatible with mild chronic small vessel ischemic disease. Mild generalized cerebral atrophy is within normal limits for age. No focal cerebellar insult is evident. Vascular: Major intracranial vascular flow voids are preserved. Skull and upper cervical spine: Unremarkable bone marrow signal. Sinuses/Orbits: Bilateral cataract extraction. Minimal mucosal thickening in the ethmoid sinuses. Clear mastoid air  cells. Other: None. IMPRESSION: 1. No acute intracranial abnormality. 2. Mild chronic small vessel ischemic disease. Electronically Signed   By: Sebastian Ache M.D.   On: 04/22/2023 13:18     Signed, Lorin Glass, MD [facility] 04/22/2023

## 2023-04-23 DIAGNOSIS — H81392 Other peripheral vertigo, left ear: Secondary | ICD-10-CM | POA: Diagnosis not present

## 2023-04-23 LAB — BASIC METABOLIC PANEL
Anion gap: 7 (ref 5–15)
BUN: 16 mg/dL (ref 8–23)
CO2: 25 mmol/L (ref 22–32)
Calcium: 9 mg/dL (ref 8.9–10.3)
Chloride: 101 mmol/L (ref 98–111)
Creatinine, Ser: 1.03 mg/dL — ABNORMAL HIGH (ref 0.44–1.00)
GFR, Estimated: 55 mL/min — ABNORMAL LOW (ref >60)
Glucose, Bld: 108 mg/dL — ABNORMAL HIGH (ref 70–99)
Potassium: 3.8 mmol/L (ref 3.5–5.1)
Sodium: 133 mmol/L — ABNORMAL LOW (ref 135–145)

## 2023-04-23 LAB — CBC
HCT: 42.8 % (ref 36.0–46.0)
Hemoglobin: 14.2 g/dL (ref 12.0–15.0)
MCH: 30.5 pg (ref 26.0–34.0)
MCHC: 33.2 g/dL (ref 30.0–36.0)
MCV: 91.8 fL (ref 80.0–100.0)
Platelets: 180 10*3/uL (ref 150–400)
RBC: 4.66 MIL/uL (ref 3.87–5.11)
RDW: 13.2 % (ref 11.5–15.5)
WBC: 12.5 10*3/uL — ABNORMAL HIGH (ref 4.0–10.5)
nRBC: 0 % (ref 0.0–0.2)

## 2023-04-23 MED ORDER — MECLIZINE HCL 12.5 MG PO TABS: 12.5000 mg | ORAL_TABLET | Freq: Three times a day (TID) | ORAL | 0 refills | Status: DC | PRN

## 2023-04-23 NOTE — Plan of Care (Signed)

## 2023-04-23 NOTE — Discharge Summary (Signed)
Physician Discharge Summary  Stephanie Aguilar JWJ:[id number] DOB: [birth year] DOA: 04/21/2023  PCP: Georgina Quint, MD  Admit date: 04/21/2023 Discharge date: 04/23/2023  Admitted From: Home Discharge disposition: Home  Recommendations at discharge:  For dizziness due to BPPV, you have been started on meclizine 12.5 mg 3 times daily as needed. Your systolic blood pressure is in 621H and diastolic is in 50, with a MAP 08-65.  Continue acebutolol and amlodipine.  I would continue to hold olmesartan at discharge.  Continue to self monitor blood pressure at home.  If systolic blood pressure is consistently over 140, can resume olmesartan.   Brief narrative: Stephanie Aguilar is a 83 y.o. female with PMH significant for HTN, HLD, anxiety/depression. Patient presented to the ED at [facility] yesterday 1/20 with complaint of dizziness with nausea upon getting up in the morning.  She has been experiencing it for the last 5 days every morning.  She feels the room spins around her for a short time.  The day prior to presentation, it happened when she was having a photo as he had to sit down.  It got more frequent to the point where she was getting dizzy every time she moved her head.  She had couple of episodes of vomiting as well and hence presented to ED. No history of trauma, fall, recent diarrhea, dehydration, bleeding. Occasional alcohol use.  Does not use any prescription pain medicines. Patient also reported intermittent palpitation in the morning that started several months ago.  In the ED, patient was afebrile, heart rate in 50s, blood pressure in 150s mostly. Initial labs mostly unremarkable On exam by EDP, Dix-Hallpike negative to right with positive.  Torsional nystagmus was present and vomiting was induced with Dix-Hallpike. Patient was given IV Valium, IV Reglan which had to be repeated multiple times while waiting for bed availability at [facility].  I evaluated the  patient after she arrived to [facility] this morning. Patient was alert, awake, oriented x 3 and able to give the details of her history. At baseline, patient is very active inside the house and in her yards. Repeat labs this morning unremarkable MRI brain did not show any acute intracranial abnormality, it showed mild chronic small vessel ischemic disease.    Subjective: Patient was seen and examined this morning.  Pleasant elderly Caucasian female.  Lying down in bed.  Not in distress.  Was able to get up and use the bathroom without getting dizzy today.  States 'meclizine seems to be working for me.'  Assessment/Plan: Principal Problem:   Peripheral vertigo involving left ear  BPPV Presented with intermittent dizziness on changing position, progressively worsening for last 5 days Dix-Hallpike maneuver positive in the ED Seen by PT. Findings positive for left posterior canal BPPV.  Epley's maneuver done by PT. Patient needs outpatient vestibular follow-up 1/21, she was started her on meclizine 12.5 mg 3 times daily scheduled.  She has received 3 doses so far and already feels better.  Able to tolerate regular consistency diet.  Able to ambulate without getting dizzy. She did not have orthostatic drop in blood pressure. MRI brain done to rule out posterior circulation stroke.  No acute intracranial pathology noted.  Mild chronic small vessel ischemic disease noted. I will discharge her home today on meclizine 12.5 mg as needed for neck 7 days.  Hypertension PTA meds- acebutolol 200 mg daily, amlodipine 2.5 mg nightly, olmesartan 40 mg daily Currently continued on acebutolol and amlodipine. Her systolic blood  pressure is in 130s and diastolic is in 50, with a MAP 25-36.  I would continue to hold olmesartan at discharge.  Patient to continue to self monitor blood pressure at home.  If systolic blood pressure is consistently over 140, can resume olmesartan.  Hyperlipidemia  Mild chronic  small vessel ischemic disease PTA meds- aspirin 81 mg daily, Crestor 2.5 mg daily Continue all  Anxiety/depression Continue Ativan 0.5 mg twice daily as needed.  Mobility: Encourage ambulation  Goals of care:   Code Status: Full Code    Diet:  Diet Order             Diet general           Diet Heart Room service appropriate? Yes; Fluid consistency: Thin  Diet effective now                   Nutritional status:  Body mass index is 20.88 kg/m.       Wounds:     Discharge Exam:   Vitals:   04/22/23 2123 04/23/23 0010 04/23/23 0508 04/23/23 0746  BP: (!) 134/55 (!) 125/54 (!) 139/59 (!) 136/51  Pulse: 66 60 62 60  Resp: 18 18 18    Temp: 97.7 F (36.5 C) 97.8 F (36.6 C) 97.7 F (36.5 C) 98.4 F (36.9 C)  TempSrc: Oral Oral Oral Oral  SpO2: 98% 97% 97% 96%  Weight:      Height:        Body mass index is 20.88 kg/m.  General exam: Pleasant, elderly Caucasian female.  Feels better Skin: No rashes, lesions or ulcers. HEENT: Atraumatic, normocephalic, no obvious bleeding Lungs: Clear to auscultation bilaterally,  CVS: S1, S2, no murmur,   GI/Abd: Soft, nontender, nondistended, bowel sound present,   CNS: Alert, awake, oriented x 3 Psychiatry: Mood appropriate,  Extremities: No pedal edema, no calf tenderness,   Follow ups:    Follow-up Information     Georgina Quint, MD. Schedule an appointment as soon as possible for a visit in 1 week.   Specialty: Internal Medicine Why: For follow-up Contact information: [address] [place] [address] [phone]         [facility] Emergency Department at [facility]. Go to .   Specialty: Emergency Medicine Why: As needed, If symptoms worsen Contact information: [address] [place] [address] [phone]        [facility] Follow up.   Specialty: Rehabilitation Why: Please follow up at the clinic regarding  Outpatient Rehabilitation Therapy. Contact information: [address] [place] [address] [phone]                Discharge Instructions:   Discharge Instructions     Ambulatory referral to Physical Therapy   Complete by: As directed    Patient needs Vestibular Rehab   Iontophoresis - 4 mg/ml of dexamethasone: No   T.E.N.S. Unit Evaluation and Dispense as Indicated: No   Call MD for:  difficulty breathing, headache or visual disturbances   Complete by: As directed    Call MD for:  extreme fatigue   Complete by: As directed    Call MD for:  hives   Complete by: As directed    Call MD for:  persistant dizziness or light-headedness   Complete by: As directed    Call MD for:  persistant nausea and vomiting   Complete by: As directed  Call MD for:  severe uncontrolled pain   Complete by: As directed    Call MD for:  temperature >100.4   Complete by: As directed    Diet general   Complete by: As directed    Discharge instructions   Complete by: As directed    Recommendations at discharge:   For dizziness due to BPPV, you have been started on meclizine 12.5 mg 3 times daily as needed.  Your systolic blood pressure is in 782N and diastolic is in 50, with a MAP 56-21.  Continue acebutolol and amlodipine.  I would continue to hold olmesartan at discharge.  Continue to self monitor blood pressure at home.  If systolic blood pressure is consistently over 140, can resume olmesartan.  General discharge instructions: Follow with Primary MD Georgina Quint, MD in 7 days  Please request your PCP  to go over your hospital tests, procedures, radiology results at the follow up. Please get your medicines reviewed and adjusted.  Your PCP may decide to repeat certain labs or tests as needed. Do not drive, operate heavy machinery, perform activities at heights, swimming or participation in water activities or provide baby sitting services if  your were admitted for syncope or siezures until you have seen by Primary MD or a Neurologist and advised to do so again. [place] Controlled Substance Reporting System database was reviewed. Do not drive, operate heavy machinery, perform activities at heights, swim, participate in water activities or provide baby-sitting services while on medications for pain, sleep and mood until your outpatient physician has reevaluated you and advised to do so again.  You are strongly recommended to comply with the dose, frequency and duration of prescribed medications. Activity: As tolerated with Full fall precautions use walker/cane & assistance as needed Avoid using any recreational substances like cigarette, tobacco, alcohol, or non-prescribed drug. If you experience worsening of your admission symptoms, develop shortness of breath, life threatening emergency, suicidal or homicidal thoughts you must seek medical attention immediately by calling 911 or calling your MD immediately  if symptoms less severe. You must read complete instructions/literature along with all the possible adverse reactions/side effects for all the medicines you take and that have been prescribed to you. Take any new medicine only after you have completely understood and accepted all the possible adverse reactions/side effects.  Wear Seat belts while driving. You were cared for by a hospitalist during your hospital stay. If you have any questions about your discharge medications or the care you received while you were in the hospital after you are discharged, you can call the unit and ask to speak with the hospitalist or the covering physician. Once you are discharged, your primary care physician will handle any further medical issues. Please note that NO REFILLS for any discharge medications will be authorized once you are discharged, as it is imperative that you return to your primary care physician (or establish a relationship with a  primary care physician if you do not have one).   Increase activity slowly   Complete by: As directed        Discharge Medications:   Allergies as of 04/23/2023       Reactions   Lactose Intolerance (gi) Diarrhea   Codeine Nausea Only   Erythromycin Nausea Only   Sick on stomach   Lisinopril Cough   Sulfa Antibiotics    rash   Latex Rash        Medication List     PAUSE taking  these medications    olmesartan 40 MG tablet Wait to take this until your doctor or other care provider tells you to start again. Commonly known as: BENICAR Take 1 tablet (40 mg total) by mouth daily.       TAKE these medications    acebutolol 200 MG capsule Commonly known as: SECTRAL Take 1 capsule (200 mg total) by mouth daily.   amLODipine 2.5 MG tablet Commonly known as: NORVASC TAKE 1 TABLET EACH DAY.   aspirin 81 MG tablet Take 81 mg by mouth daily.   carboxymethylcellulose 0.5 % Soln Commonly known as: REFRESH PLUS Place 2 drops into both eyes 3 (three) times daily as needed.   clobetasol 0.05 % external solution Commonly known as: TEMOVATE Apply 10 drops to scalp every night at bedtime.   cycloSPORINE 0.05 % ophthalmic emulsion Commonly known as: RESTASIS 1 drop 2 (two) times daily.   desloratadine 5 MG tablet Commonly known as: CLARINEX Take 1 tablet (5 mg total) by mouth daily. as directed   Dotti 0.025 MG/24HR Generic drug: estradiol Place 1 patch onto the skin 2 (two) times a week.   fluticasone 50 MCG/ACT nasal spray Commonly known as: FLONASE USE 2 SPRAYS INTO EACH NOSTRIL DAILY   LORazepam 0.5 MG tablet Commonly known as: ATIVAN Take 1 tablet (0.5 mg total) by mouth 2 (two) times daily as needed for anxiety.   meclizine 12.5 MG tablet Commonly known as: ANTIVERT Take 1 tablet (12.5 mg total) by mouth 3 (three) times daily as needed for dizziness.   METRONIDAZOLE (TOPICAL) 0.75 % Lotn APPLY TOPICALLY TO AFFECTED AREA.   ondansetron 4 MG  disintegrating tablet Commonly known as: ZOFRAN-ODT Take 1 tablet (4 mg total) by mouth every 8 (eight) hours as needed for nausea or vomiting.   OVER THE COUNTER MEDICATION OTC Imodium taking daily for diarrrhea   rosuvastatin 5 MG tablet Commonly known as: CRESTOR Take 0.5 tablets (2.5 mg total) by mouth daily.   Vitamin D 50 MCG (2000 UT) Caps Take 1 capsule by mouth daily.         The results of significant diagnostics from this hospitalization (including imaging, microbiology, ancillary and laboratory) are listed below for reference.    Procedures and Diagnostic Studies:   MR BRAIN WO CONTRAST Result Date: 04/22/2023 CLINICAL DATA:  Dizziness with nausea for 5 days. EXAM: MRI HEAD WITHOUT CONTRAST TECHNIQUE: Multiplanar, multiecho pulse sequences of the brain and surrounding structures were obtained without intravenous contrast. COMPARISON:  None Available. FINDINGS: Brain: There is no evidence of an acute infarct, intracranial hemorrhage, mass, midline shift, or extra-axial fluid collection. T2 hyperintensities in the cerebral white matter are nonspecific but compatible with mild chronic small vessel ischemic disease. Mild generalized cerebral atrophy is within normal limits for age. No focal cerebellar insult is evident. Vascular: Major intracranial vascular flow voids are preserved. Skull and upper cervical spine: Unremarkable bone marrow signal. Sinuses/Orbits: Bilateral cataract extraction. Minimal mucosal thickening in the ethmoid sinuses. Clear mastoid air cells. Other: None. IMPRESSION: 1. No acute intracranial abnormality. 2. Mild chronic small vessel ischemic disease. Electronically Signed   By: Sebastian Ache M.D.   On: 04/22/2023 13:18     Labs:   Basic Metabolic Panel: Recent Labs  Lab 04/21/23 0957 04/22/23 0940 04/23/23 0613  NA 138 140 133*  K 4.0 3.9 3.8  CL 104 106 101  CO2 29 27 25   GLUCOSE 107* 97 108*  BUN 23 15 16   CREATININE 0.95 1.07* 1.03*  CALCIUM 9.9 9.5 9.0  MG 2.0 2.1  --   PHOS  --  3.6  --    GFR Estimated Creatinine Clearance: 29.2 mL/min (A) (by C-G formula based on SCr of 1.03 mg/dL (H)). Liver Function Tests: Recent Labs  Lab 04/21/23 0957  AST 17  ALT 14  ALKPHOS 73  BILITOT 0.6  PROT 6.8  ALBUMIN 4.2   No results for input(s): "LIPASE", "AMYLASE" in the last 168 hours. No results for input(s): "AMMONIA" in the last 168 hours. Coagulation profile No results for input(s): "INR", "PROTIME" in the last 168 hours.  CBC: Recent Labs  Lab 04/21/23 0957 04/22/23 0940 04/23/23 0613  WBC 7.0 7.3 12.5*  NEUTROABS 4.9  --   --   HGB 13.7 14.8 14.2  HCT 42.2 44.9 42.8  MCV 92.3 91.3 91.8  PLT 179 177 180   Cardiac Enzymes: No results for input(s): "CKTOTAL", "CKMB", "CKMBINDEX", "TROPONINI" in the last 168 hours. BNP: Invalid input(s): "POCBNP" CBG: No results for input(s): "GLUCAP" in the last 168 hours. D-Dimer No results for input(s): "DDIMER" in the last 72 hours. Hgb A1c No results for input(s): "HGBA1C" in the last 72 hours. Lipid Profile No results for input(s): "CHOL", "HDL", "LDLCALC", "TRIG", "CHOLHDL", "LDLDIRECT" in the last 72 hours. Thyroid function studies Recent Labs    04/22/23 0940  TSH 3.289   Anemia work up No results for input(s): "VITAMINB12", "FOLATE", "FERRITIN", "TIBC", "IRON", "RETICCTPCT" in the last 72 hours. Microbiology No results found for this or any previous visit (from the past 240 hours).  Time coordinating discharge: 45 minutes  Signed: Jaivon Vanbeek  [facility] 04/23/2023, 1:27 PM

## 2023-04-23 NOTE — Progress Notes (Signed)
Physical Therapy Treatment Patient Details Name: Stephanie Aguilar MRN: [id number] DOB: 03/26/1941 Today's Date: 04/23/2023   History of Present Illness Patient is an 83 y/o female admitted 04/21/23 with dizziness and nausea.  Felt to be due to peripheral cause.  PMH positive for HLD, HTN, cataract, depression, anxiety.    PT Comments  Patient making excellent progress with mobility and reports dizziness decreased from yesterday. Pt completed bed mob at mod Ind level and sit<>stand with supervision only, use of hands to rise and lower. Pt abel to ambulate with no AD and no HHA, mildly unsteady but no overt LOB and pt able to self correct, amb ~400', VSS. Lt [employer] assessed and pt reported symptoms and Lt upward rotatory nystagmus observed. Treated 1x with Eply's and pt symptoms improved throughout treatment. Discussed benefits/purpose of OPPT follow up for vestibular testing and treatment. Will continue to progress pt as able during stay.    If plan is discharge home, recommend the following: A little help with walking and/or transfers;Assist for transportation;Assistance with cooking/housework;A little help with bathing/dressing/bathroom;Help with stairs or ramp for entrance   Can travel by private vehicle        Equipment Recommendations  None recommended by PT (pt to obtain DME through [facility] [employer] [facility] ([facility]))    Recommendations for Other Services       Precautions / Restrictions Precautions Precautions: Fall Restrictions Weight Bearing Restrictions Per Provider Order: No      Vestibular Assessment - 04/23/23 0001       Positional Testing   Dix-Hallpike Dix-Hallpike Left      Dix-Hallpike Left   Dix-Hallpike Left Duration 1 minute    Dix-Hallpike Left Symptoms Upbeat, left rotatory nystagmus   <20 seconds            Mobility  Bed Mobility Overal bed mobility: Modified Independent             General bed mobility comments: pt completed  supine<>sit, rolling and sidelying<>sit with no bed features or assist    Transfers Overall transfer level: Needs assistance Equipment used: None Transfers: Sit to/from Stand Sit to Stand: Supervision           General transfer comment: sup for safety with sit<>stand, use of hands to rise and lower.    Ambulation/Gait Ambulation/Gait assistance: Contact guard assist, Supervision Gait Distance (Feet): 400 Feet Assistive device: None Gait Pattern/deviations: Step-through pattern, Decreased stride length Gait velocity: decr/fair     General Gait Details: pt wanting to reach out for support of furniture/walls in room, detered use of UE's to challenge balance. CGA for safety fading to supervision. pt with mild drift/unsteadiness but no overt LOB and pt able to correct and stabilize without support.   Stairs             Wheelchair Mobility     Tilt Bed    Modified Rankin (Stroke Patients Only)       Balance Overall balance assessment: Needs assistance Sitting-balance support: Feet supported Sitting balance-Leahy Scale: Good     Standing balance support: No upper extremity supported, During functional activity Standing balance-Leahy Scale: Good                              Cognition Arousal: Alert Behavior During Therapy: WFL for tasks assessed/performed Overall Cognitive Status: Within Functional Limits for tasks assessed  Exercises      General Comments        Pertinent Vitals/Pain Pain Assessment Pain Assessment: No/denies pain    Home Living                          Prior Function            PT Goals (current goals can now be found in the care plan section) Acute Rehab PT Goals Patient Stated Goal: return to independent PT Goal Formulation: With patient Time For Goal Achievement: 04/29/23 Potential to Achieve Goals: Good Progress towards PT goals:  Progressing toward goals    Frequency    Min 1X/week      PT Plan      Co-evaluation              AM-PAC PT "6 Clicks" Mobility   Outcome Measure  Help needed turning from your back to your side while in a flat bed without using bedrails?: None Help needed moving from lying on your back to sitting on the side of a flat bed without using bedrails?: None Help needed moving to and from a bed to a chair (including a wheelchair)?: A Little Help needed standing up from a chair using your arms (e.g., wheelchair or bedside chair)?: A Little Help needed to walk in hospital room?: A Little Help needed climbing 3-5 steps with a railing? : A Little 6 Click Score: 20    End of Session Equipment Utilized During Treatment: Gait belt Activity Tolerance: Patient tolerated treatment well Patient left: in chair;with call bell/phone within reach Nurse Communication: Mobility status PT Visit Diagnosis: BPPV;Other abnormalities of gait and mobility (R26.89) BPPV - Right/Left : Left     Time: 1610-9604 PT Time Calculation (min) (ACUTE ONLY): 36 min  Charges:    $Gait Training: 8-22 mins $Canalith Rep Proc: 8-22 mins PT General Charges $$ ACUTE PT VISIT: 1 Visit                     Wynn Maudlin, DPT Acute Rehabilitation Services Office [phone]  04/23/23 11:45 AM

## 2023-04-23 NOTE — TOC Transition Note (Signed)
Transition of Care [facility]) - Discharge Note   Patient Details  Name: Stephanie Aguilar MRN: [id number] Date of Birth: [birth year]  [facility] CM/SW Contact:  Gordy Clement, RN Phone Number: 04/23/2023, 1:36 PM   Clinical Narrative:     Patient will DC to home. OPPT referral made to [facility]. No additional TOC needs Family to transport           Patient Goals and CMS Choice            Discharge Placement                       Discharge Plan and Services Additional resources added to the After Visit Summary for                                       Social Drivers of Health (SDOH) Interventions SDOH Screenings   Food Insecurity: No Food Insecurity (04/22/2023)  Housing: Low Risk  (04/22/2023)  Transportation Needs: No Transportation Needs (04/22/2023)  Utilities: Not At Risk (04/22/2023)  Alcohol Screen: Low Risk  (12/13/2021)  Depression (PHQ2-9): Low Risk  (12/31/2022)  Financial Resource Strain: Low Risk  (12/13/2021)  Physical Activity: Sufficiently Active (12/13/2021)  Social Connections: Socially Integrated (04/22/2023)  Stress: No Stress Concern Present (12/13/2021)  Tobacco Use: Low Risk  (04/21/2023)     Readmission Risk Interventions     No data to display

## 2023-04-29 ENCOUNTER — Other Ambulatory Visit: Payer: Self-pay

## 2023-04-29 ENCOUNTER — Ambulatory Visit: Payer: Medicare Other | Attending: Internal Medicine

## 2023-04-29 DIAGNOSIS — R42 Dizziness and giddiness: Secondary | ICD-10-CM | POA: Diagnosis present

## 2023-04-29 DIAGNOSIS — M542 Cervicalgia: Secondary | ICD-10-CM | POA: Insufficient documentation

## 2023-04-29 DIAGNOSIS — H8112 Benign paroxysmal vertigo, left ear: Secondary | ICD-10-CM | POA: Insufficient documentation

## 2023-04-29 NOTE — Therapy (Signed)
OUTPATIENT PHYSICAL THERAPY VESTIBULAR EVALUATION     Patient Name: Stephanie Aguilar MRN: [id number] DOB:[birth year], 83 y.o., female Today's Date: 04/29/2023  END OF SESSION:  PT End of Session - 04/29/23 0759     Visit Number 1    Number of Visits 8    Date for PT Re-Evaluation 05/27/23    Authorization Type [employer]    PT Start Time 0800    PT Stop Time 0845    PT Time Calculation (min) 45 min             Past Medical History:  Diagnosis Date   Allergy    Anxiety    Cataract    Depression    Hyperlipidemia    Hypertension    Past Surgical History:  Procedure Laterality Date   ABDOMINAL HYSTERECTOMY     APPENDECTOMY     BREAST SURGERY     DENTAL SURGERY     TONSILLECTOMY AND ADENOIDECTOMY     Patient Active Problem List   Diagnosis Date Noted   Peripheral vertigo involving left ear 04/21/2023   CKD (chronic kidney disease), stage III (HCC) 04/13/2016   PAD (peripheral artery disease) (HCC) 04/12/2016   Anxiety disorder 11/07/2015   Carotid bruit 07/06/2013   Essential hypertension 02/04/2012   Dyslipidemia 02/04/2012   Menopausal syndrome on hormone replacement therapy 02/04/2012    PCP: Tressa Busman, MD REFERRING PROVIDER: Lorin Glass, MD  REFERRING DIAG: H81.12 (ICD-10-CM) - Benign paroxysmal positional vertigo of left ear  THERAPY DIAG:  Dizziness and giddiness  Cervicalgia  ONSET DATE: 7 days ago  Rationale for Evaluation and Treatment: Rehabilitation  SUBJECTIVE:   SUBJECTIVE STATEMENT: Sudden onset of vertigo when arising from bed with onset about one week ago and was hospitalized where they performed Epley three times.  Notes improved symptoms since hospitalization and receiving Epley maneuver.  Reports her back has been hurting since hospital due to discomfort of bed.  Notes reaching forward has been causing discomfort in her neck and back, low back is feeling better but neck has remained limiting.  She has been moving slowly to avoid triggering movements and her neck has been limiting these movements as well.  Denies tinnitus (other than one day in right ear), no photo/phonophobia, no HA Pt accompanied by: significant other  PERTINENT HISTORY: PMH significant for HTN, HLD, anxiety/depression. Patient presented to the ED at [facility] yesterday 1/20 with complaint of dizziness with nausea upon getting up in the morning.  She has been experiencing it for the last 5 days every morning.  She feels the room spins around her for a short time.  The day prior to presentation, it happened when she was having a photo as he had to sit down.  PAIN:  Are you having pain? Yes: NPRS scale: 7-8/10 Pain location: neck, upper back, shoulders Pain description: sharp Aggravating factors: neck rotation, reaching Relieving factors: slow movements, avoidance  PRECAUTIONS: None  RED FLAGS: None   WEIGHT BEARING RESTRICTIONS: No  FALLS: Has patient fallen in last 6 months? No  LIVING ENVIRONMENT: Lives with: lives with their spouse Lives in: House/apartment, 2nd floor BR Stairs: exterior/interior w/ HR Has following equipment at home: None, has a shower chair and walker at her disposal  PLOF: Independent, walks for exercise, yard work  PATIENT GOALS: eliminate symptoms  OBJECTIVE:  Note: Objective measures were completed at Evaluation unless otherwise noted.  DIAGNOSTIC FINDINGS:   COGNITION: Overall cognitive status: Within functional limits for tasks assessed  SENSATION: WFL  EDEMA:    PALPATION:  tenderness and trigger points appreciated along medial scapular border R > L   DTRs:  NT  POSTURE:  No Significant postural limitations  Cervical ROM:    Active A/PROM (deg) eval  Flexion 60  Extension 20  Right lateral flexion 15  Left lateral flexion 15  Right rotation 25  Left rotation 25  (Blank rows = not tested)  STRENGTH:   LOWER EXTREMITY MMT:   MMT Right eval  Left eval  Hip flexion    Hip abduction    Hip adduction    Hip internal rotation    Hip external rotation    Knee flexion    Knee extension    Ankle dorsiflexion    Ankle plantarflexion    Ankle inversion    Ankle eversion    (Blank rows = not tested)  BED MOBILITY:  indep  TRANSFERS: Indep    GAIT: Gait pattern:  decreased speed due to guarding from symptoms Distance walked:  Assistive device utilized: None Level of assistance: Complete Independence Comments:     VESTIBULAR ASSESSMENT:  GENERAL OBSERVATION: wears progressive lens glasses   SYMPTOM BEHAVIOR:  Subjective history: 7 day hx of positional vertigo  Non-Vestibular symptoms: neck pain  Type of dizziness: Spinning/Vertigo  Frequency: daily  Duration: seconds-minutes   Aggravating factors: Induced by position change: lying supine, rolling to the left, and supine to sit  Relieving factors: slow movements  Progression of symptoms: better  OCULOMOTOR EXAM:  Ocular Alignment: normal  Ocular ROM: No Limitations  Spontaneous Nystagmus: absent  Gaze-Induced Nystagmus: absent  Smooth Pursuits: intact  Saccades: slow  Convergence/Divergence:  cm    VESTIBULAR - OCULAR REFLEX:   Slow VOR: Comment: limited ROM due to neck pain  VOR Cancellation: Comment: slow, notes some provocation of symptoms  Head-Impulse Test: unable to perform due to neck pain  Dynamic Visual Acuity: Not able to be assessed   POSITIONAL TESTING: Right Dix-Hallpike: no nystagmus Left Dix-Hallpike: no nystagmus Right Roll Test: no nystagmus Left Roll Test: no nystagmus  MOTION SENSITIVITY:  Motion Sensitivity Quotient Intensity: 0 = none, 1 = Lightheaded, 2 = Mild, 3 = Moderate, 4 = Severe, 5 = Vomiting  Intensity  1. Sitting to supine   2. Supine to L side   3. Supine to R side   4. Supine to sitting   5. L Hallpike-Dix   6. Up from L    7. R Hallpike-Dix   8. Up from R    9. Sitting, head tipped to L knee   10. Head  up from L knee   11. Sitting, head tipped to R knee   12. Head up from R knee   13. Sitting head turns x5   14.Sitting head nods x5   15. In stance, 180 turn to L    16. In stance, 180 turn to R     OTHOSTATICS: not done  FUNCTIONAL GAIT: Functional gait assessment: TBD M-CTSIB: TBD  TREATMENT DATE: 04/29/23    PATIENT EDUCATION: Education details: assessment details, HEP initiation Person educated: Patient and Spouse Education method: Explanation and Handouts Education comprehension: needs further education  HOME EXERCISE PROGRAM:  Access Code: [email or web address] URL: [email or web address] Date: 04/29/2023 Prepared by: Shary Decamp  Exercises - Cervical Extension AROM with Strap  - 1 x daily - 7 x weekly - 3 sets - 10 reps - Mid-Lower Cervical Extension SNAG with Strap  - 1 x daily - 7 x weekly - 3 sets - 10 reps - Standing Lower Cervical and Upper Thoracic Stretch  - 1 x daily - 7 x weekly - 3 sets - 10 reps - Supine Isometric Neck Extension  - 1 x daily - 7 x weekly - 1-3 sets - 10 reps - 2 sec hold  GOALS: Goals reviewed with patient? Yes  SHORT TERM GOALS: Target date: same as LTG    LONG TERM GOALS: Target date: 05/27/2023    Patient will be independent in HEP to improve functional outcomes Baseline:  Goal status: INITIAL  2.  Demo improved cervical spine ROM to 35 degrees rotation to improve comfort/safety when driving Baseline: 25 degrees R/L Goal status: INITIAL  3.  Demo score of 25/30 Functional Gait Assessment to manifest improved balance and reduced risk for falls Baseline: TBD Goal status: INITIAL  4.  Pt to report neck pain not exceeding 2/10 during daily activities to improve comfort and activity tolerance Baseline: 7-8/10 with sharp pains with certain movements/activities Goal status:  INITIAL    ASSESSMENT:  CLINICAL IMPRESSION: Patient is a 83 y.o. lady who was seen today for physical therapy evaluation and treatment for dizziness related to BPPV.  Thankfully, it seems her positional vertigo has abated thanks to successful Epley maneuver administered while hospitalized.  Pt has been experiencing increased neck pain since outset of this episode and demonstrates guarded and limited cervical spine ROM and experiencing sharp pains with certain movements and activities limiting her activity tolerance and participation.  Patient would benefit from ongoing PT sessions to improve neck pain/ROM limitations and address dizziness if applicable to restore abilities to PLOF   OBJECTIVE IMPAIRMENTS: Abnormal gait, decreased activity tolerance, decreased balance, decreased ROM, dizziness, hypomobility, increased muscle spasms, and pain.   ACTIVITY LIMITATIONS: carrying, lifting, bending, reach over head, and locomotion level  PARTICIPATION LIMITATIONS: meal prep, cleaning, laundry, driving, shopping, community activity, yard work, and exercise routine  PERSONAL FACTORS: Age, Time since onset of injury/illness/exacerbation, and 1 comorbidity: PMH  are also affecting patient's functional outcome.   REHAB POTENTIAL: Excellent  CLINICAL DECISION MAKING: Evolving/moderate complexity  EVALUATION COMPLEXITY: Moderate   PLAN:  PT FREQUENCY: 1-2x/week  PT DURATION: 4 weeks  PLANNED INTERVENTIONS: [id number]- PT Re-evaluation, 97110-Therapeutic exercises, 97530- Therapeutic activity, [id number]- Neuromuscular re-education, 97535- Self Care, [id number]- Manual therapy, [id number]- Gait training, [phone]- Canalith repositioning, [id number]- Aquatic Therapy, [phone]- Electrical stimulation (manual), Patient/Family education, Balance training, Taping, Dry Needling, Joint mobilization, Spinal mobilization, Vestibular training, DME instructions, Cryotherapy, and Moist heat  PLAN FOR NEXT SESSION: HEP review, manual therapy  to neck, treat positional dizziness as indicated   10:50 AM, 04/29/23 M. Shary Decamp, PT, DPT Physical Therapist- [facility] Office Number: [phone]

## 2023-04-30 ENCOUNTER — Encounter: Payer: Self-pay | Admitting: Emergency Medicine

## 2023-04-30 ENCOUNTER — Ambulatory Visit: Payer: Medicare Other | Admitting: Emergency Medicine

## 2023-04-30 VITALS — BP 134/78 | HR 62 | Temp 97.7°F | Ht 59.0 in | Wt 101.0 lb

## 2023-04-30 DIAGNOSIS — F418 Other specified anxiety disorders: Secondary | ICD-10-CM | POA: Diagnosis not present

## 2023-04-30 DIAGNOSIS — Z09 Encounter for follow-up examination after completed treatment for conditions other than malignant neoplasm: Secondary | ICD-10-CM

## 2023-04-30 DIAGNOSIS — H8113 Benign paroxysmal vertigo, bilateral: Secondary | ICD-10-CM | POA: Insufficient documentation

## 2023-04-30 DIAGNOSIS — I10 Essential (primary) hypertension: Secondary | ICD-10-CM

## 2023-04-30 DIAGNOSIS — N1831 Chronic kidney disease, stage 3a: Secondary | ICD-10-CM | POA: Diagnosis not present

## 2023-04-30 DIAGNOSIS — E785 Hyperlipidemia, unspecified: Secondary | ICD-10-CM

## 2023-04-30 MED ORDER — MECLIZINE HCL 12.5 MG PO TABS: 12.5000 mg | ORAL_TABLET | Freq: Three times a day (TID) | ORAL | 0 refills | Status: AC | PRN

## 2023-04-30 NOTE — Assessment & Plan Note (Signed)
Stable and well-controlled Continues lorazepam 0.5 mg daily as needed

## 2023-04-30 NOTE — Assessment & Plan Note (Signed)
Chronic stable condition. Advised to stay well-hydrated and avoid NSAIDs

## 2023-04-30 NOTE — Assessment & Plan Note (Signed)
Well-controlled hypertension off olmesartan. Normal blood pressure readings at home Continue amlodipine 2.5 mg daily.  Continue Sectral 200 mg daily. Will consider restarting olmesartan if blood pressure readings get higher at home. Cardiovascular risks associated with hypertension discussed Diet and nutrition discussed

## 2023-04-30 NOTE — Progress Notes (Signed)
Stephanie Aguilar 83 y.o.   Chief Complaint  Patient presents with   Hospitalization Follow-up    Patient went to the ED for BPPV she does state it is getting better. Patient is taking Advil twice daily for her back she said the hospital beds messed up her back. She is seeing PT for it, also wants to see if there is other things she can do to help with her shoulders and back      HISTORY OF PRESENT ILLNESS: This is a 83 y.o. female here for follow-up of recent hospital admission when she was admitted on 04/21/2023 with dizziness and vertigo and released on 04/23/2023 Final diagnosis BPPV.  Meclizine helping. Feels about 95% better History of hypertension.  Olmesartan was stopped due to normal blood pressure readings.  Continues to have normal blood pressure readings at home while staying off olmesartan. No complaints or any other medical concerns today Hospital discharge summary as follows: Physician Discharge Summary  Stephanie Aguilar EXB:[id number] DOB: 25-Mar-1941 DOA: 04/21/2023   PCP: Georgina Quint, MD   Admit date: 04/21/2023 Discharge date: 04/23/2023   Admitted From: Home Discharge disposition: Home   Recommendations at discharge:  For dizziness due to BPPV, you have been started on meclizine 12.5 mg 3 times daily as needed. Your systolic blood pressure is in 102V and diastolic is in 50, with a MAP 25-36.  Continue acebutolol and amlodipine.  I would continue to hold olmesartan at discharge.  Continue to self monitor blood pressure at home.  If systolic blood pressure is consistently over 140, can resume olmesartan Assessment/Plan: Principal Problem:   Peripheral vertigo involving left ear   BPPV Presented with intermittent dizziness on changing position, progressively worsening for last 5 days Dix-Hallpike maneuver positive in the ED Seen by PT. Findings positive for left posterior canal BPPV.  Epley's maneuver done by PT. Patient needs outpatient  vestibular follow-up 1/21, she was started her on meclizine 12.5 mg 3 times daily scheduled.  She has received 3 doses so far and already feels better.  Able to tolerate regular consistency diet.  Able to ambulate without getting dizzy. She did not have orthostatic drop in blood pressure. MRI brain done to rule out posterior circulation stroke.  No acute intracranial pathology noted.  Mild chronic small vessel ischemic disease noted. I will discharge her home today on meclizine 12.5 mg as needed for neck 7 days.   Hypertension PTA meds- acebutolol 200 mg daily, amlodipine 2.5 mg nightly, olmesartan 40 mg daily Currently continued on acebutolol and amlodipine. Her systolic blood pressure is in 644I and diastolic is in 50, with a MAP 34-74.  I would continue to hold olmesartan at discharge.  Patient to continue to self monitor blood pressure at home.  If systolic blood pressure is consistently over 140, can resume olmesartan.   Hyperlipidemia  Mild chronic small vessel ischemic disease PTA meds- aspirin 81 mg daily, Crestor 2.5 mg daily Continue all   Anxiety/depression Continue Ativan 0.5 mg twice daily as needed.    HPI   Prior to Admission medications   Medication Sig Start Date End Date Taking? Authorizing Provider  acebutolol (SECTRAL) 200 MG capsule Take 1 capsule (200 mg total) by mouth daily. 12/31/22 12/26/23 Yes Minahil Quinlivan, Eilleen Kempf, MD  amLODipine (NORVASC) 2.5 MG tablet TAKE 1 TABLET EACH DAY. 12/31/22  Yes SagardiaEilleen Kempf, MD  aspirin 81 MG tablet Take 81 mg by mouth daily.   Yes [provider]  carboxymethylcellulose (REFRESH PLUS)  0.5 % SOLN Place 2 drops into both eyes 3 (three) times daily as needed.   Yes [provider]  Cholecalciferol (VITAMIN D) 2000 units CAPS Take 1 capsule by mouth daily.   Yes [provider]  clobetasol (TEMOVATE) 0.05 % external solution Apply 10 drops to scalp every night at bedtime. 04/11/15  Yes Collene Gobble,  MD  cycloSPORINE (RESTASIS) 0.05 % ophthalmic emulsion 1 drop 2 (two) times daily.   Yes [provider]  desloratadine (CLARINEX) 5 MG tablet Take 1 tablet (5 mg total) by mouth daily. as directed 12/31/22  Yes Valerian Jewel, Eilleen Kempf, MD  DOTTI 0.025 MG/24HR Place 1 patch onto the skin 2 (two) times a week. 04/14/23  Yes [provider]  fluticasone (FLONASE) 50 MCG/ACT nasal spray USE 2 SPRAYS INTO EACH NOSTRIL DAILY 09/20/14  Yes Daub, Maylon Peppers, MD  LORazepam (ATIVAN) 0.5 MG tablet Take 1 tablet (0.5 mg total) by mouth 2 (two) times daily as needed for anxiety. 12/31/22  Yes Tierria Watson, Eilleen Kempf, MD  METRONIDAZOLE, TOPICAL, 0.75 % LOTN APPLY TOPICALLY TO AFFECTED AREA. 09/21/15  Yes Daub, Maylon Peppers, MD  OVER THE COUNTER MEDICATION OTC Imodium taking daily for diarrrhea   Yes [provider]  rosuvastatin (CRESTOR) 5 MG tablet Take 0.5 tablets (2.5 mg total) by mouth daily. 12/31/22 12/26/23 Yes Finnley Larusso, Eilleen Kempf, MD  meclizine (ANTIVERT) 12.5 MG tablet Take 1 tablet (12.5 mg total) by mouth 3 (three) times daily as needed for dizziness. 04/30/23   Georgina Quint, MD  olmesartan (BENICAR) 40 MG tablet Take 1 tablet (40 mg total) by mouth daily. Patient not taking: Reported on 04/30/2023 12/31/22   Georgina Quint, MD  ondansetron (ZOFRAN-ODT) 4 MG disintegrating tablet Take 1 tablet (4 mg total) by mouth every 8 (eight) hours as needed for nausea or vomiting. Patient not taking: Reported on 04/30/2023 04/21/23   Loetta Rough, MD    Allergies  Allergen Reactions   Lactose Intolerance (Gi) Diarrhea   Codeine Nausea Only   Erythromycin Nausea Only    Sick on stomach   Lisinopril Cough   Sulfa Antibiotics     rash   Latex Rash    Patient Active Problem List   Diagnosis Date Noted   Peripheral vertigo involving left ear 04/21/2023   CKD (chronic kidney disease), stage III (HCC) 04/13/2016   PAD (peripheral artery disease) (HCC) 04/12/2016   Anxiety  disorder 11/07/2015   Carotid bruit 07/06/2013   Essential hypertension 02/04/2012   Dyslipidemia 02/04/2012   Menopausal syndrome on hormone replacement therapy 02/04/2012    Past Medical History:  Diagnosis Date   Allergy    Anxiety    Cataract    Depression    Hyperlipidemia    Hypertension     Past Surgical History:  Procedure Laterality Date   ABDOMINAL HYSTERECTOMY     APPENDECTOMY     BREAST SURGERY     DENTAL SURGERY     TONSILLECTOMY AND ADENOIDECTOMY      Social History   Socioeconomic History   Marital status: Married    Spouse name: Not on file   Number of children: Not on file   Years of education: Not on file   Highest education level: Not on file  Occupational History   Occupation: retired/homemaker  Tobacco Use   Smoking status: Never   Smokeless tobacco: Never  Substance and Sexual Activity   Alcohol use: Yes    Comment: 1-2 oz weekly  Drug use: No   Sexual activity: Never  Other Topics Concern   Not on file  Social History Narrative   Not on file   Social Drivers of Health   Financial Resource Strain: Low Risk  (12/13/2021)   Overall Financial Resource Strain (CARDIA)    Difficulty of Paying Living Expenses: Not hard at all  Food Insecurity: No Food Insecurity (04/22/2023)   Hunger Vital Sign    Worried About Running Out of Food in the Last Year: Never true    Ran Out of Food in the Last Year: Never true  Transportation Needs: No Transportation Needs (04/22/2023)   PRAPARE - Administrator, Civil Service (Medical): No    Lack of Transportation (Non-Medical): No  Physical Activity: Sufficiently Active (12/13/2021)   Exercise Vital Sign    Days of Exercise per Week: 7 days    Minutes of Exercise per Session: 30 min  Stress: No Stress Concern Present (12/13/2021)   [employer] of Occupational Health - Occupational Stress Questionnaire    Feeling of Stress : Not at all  Social Connections: Socially Integrated (04/22/2023)    Social Connection and Isolation Panel [NHANES]    Frequency of Communication with Friends and Family: More than three times a week    Frequency of Social Gatherings with Friends and Family: More than three times a week    Attends Religious Services: More than 4 times per year    Active Member of [employer] or Organizations: Yes    Attends Engineer, structural: More than 4 times per year    Marital Status: Married  Catering manager Violence: Not At Risk (04/22/2023)   Humiliation, Afraid, Rape, and Kick questionnaire    Fear of Current or Ex-Partner: No    Emotionally Abused: No    Physically Abused: No    Sexually Abused: No    Family History  Problem Relation Age of Onset   Hypertension Mother    Alcohol abuse Father    COPD Brother    Hypertension Brother    Alcohol abuse Brother    Heart disease Maternal Grandmother    Cancer Maternal Grandfather    Stroke Paternal Grandmother    Gout Brother    Alcohol abuse Brother      Review of Systems  Constitutional: Negative.  Negative for chills and fever.  HENT: Negative.  Negative for congestion and sore throat.   Respiratory: Negative.  Negative for cough and shortness of breath.   Cardiovascular: Negative.  Negative for chest pain and palpitations.  Gastrointestinal:  Negative for abdominal pain, diarrhea, nausea and vomiting.  Genitourinary: Negative.  Negative for dysuria and hematuria.  Skin: Negative.  Negative for rash.  Neurological: Negative.  Negative for dizziness and headaches.  All other systems reviewed and are negative.   Vitals:   04/30/23 1051  BP: 134/78  Pulse: 62  Temp: 97.7 F (36.5 C)  SpO2: 99%    Physical Exam Vitals reviewed.  Constitutional:      Appearance: Normal appearance.  HENT:     Head: Normocephalic.     Mouth/Throat:     Mouth: Mucous membranes are moist.     Pharynx: Oropharynx is clear.  Eyes:     Extraocular Movements: Extraocular movements intact.      Conjunctiva/sclera: Conjunctivae normal.     Pupils: Pupils are equal, round, and reactive to light.  Cardiovascular:     Rate and Rhythm: Normal rate and regular rhythm.  Pulses: Normal pulses.     Heart sounds: Normal heart sounds.  Pulmonary:     Effort: Pulmonary effort is normal.     Breath sounds: Normal breath sounds.  Musculoskeletal:     Cervical back: No tenderness.     Right lower leg: No edema.     Left lower leg: No edema.  Lymphadenopathy:     Cervical: No cervical adenopathy.  Skin:    General: Skin is warm and dry.     Capillary Refill: Capillary refill takes less than 2 seconds.  Neurological:     General: No focal deficit present.     Mental Status: She is alert and oriented to person, place, and time.  Psychiatric:        Mood and Affect: Mood normal.        Behavior: Behavior normal.      ASSESSMENT & PLAN: A total of 45 minutes was spent with the patient and counseling/coordination of care regarding preparing for this visit, review of most recent office visit notes, review of most recent hospital discharge summary notes, review of most recent imaging of brain, review of multiple chronic medical conditions and their management, review of all medications, review of most recent bloodwork results, review of health maintenance items, education on nutrition, prognosis, documentation, and need for follow up.  Problem List Items Addressed This Visit       Cardiovascular and Mediastinum   Essential hypertension   Well-controlled hypertension off olmesartan. Normal blood pressure readings at home Continue amlodipine 2.5 mg daily.  Continue Sectral 200 mg daily. Will consider restarting olmesartan if blood pressure readings get higher at home. Cardiovascular risks associated with hypertension discussed Diet and nutrition discussed        Nervous and Auditory   Benign paroxysmal positional vertigo due to bilateral vestibular disorder - Primary   Much  improved.  No complications.  No concerns. About 95% improvement No abnormal physical findings.  No red flag signs or symptoms. Advised to reduce daily meclizine dose Fall precautions given        Genitourinary   CKD (chronic kidney disease), stage III (HCC)   Chronic stable condition Advised to stay well-hydrated and avoid NSAIDs        Other   Dyslipidemia   Stable chronic condition Continues low-dose rosuvastatin 2.5 mg daily Higher doses gave her musculoskeletal side effects      Anxiety disorder   Stable and well-controlled Continues lorazepam 0.5 mg daily as needed      Other Visit Diagnoses       Hospital discharge follow-up          Patient Instructions  Health Maintenance After Age 43 After age 38, you are at a higher risk for certain long-term diseases and infections as well as injuries from falls. Falls are a major cause of broken bones and head injuries in people who are older than age 27. Getting regular preventive care can help to keep you healthy and well. Preventive care includes getting regular testing and making lifestyle changes as recommended by your health care provider. Talk with your health care provider about: Which screenings and tests you should have. A screening is a test that checks for a disease when you have no symptoms. A diet and exercise plan that is right for you. What should I know about screenings and tests to prevent falls? Screening and testing are the best ways to find a health problem early. Early diagnosis and treatment give you the  best chance of managing medical conditions that are common after age 36. Certain conditions and lifestyle choices may make you more likely to have a fall. Your health care provider may recommend: Regular vision checks. Poor vision and conditions such as cataracts can make you more likely to have a fall. If you wear glasses, make sure to get your prescription updated if your vision changes. Medicine review.  Work with your health care provider to regularly review all of the medicines you are taking, including over-the-counter medicines. Ask your health care provider about any side effects that may make you more likely to have a fall. Tell your health care provider if any medicines that you take make you feel dizzy or sleepy. Strength and balance checks. Your health care provider may recommend certain tests to check your strength and balance while standing, walking, or changing positions. Foot health exam. Foot pain and numbness, as well as not wearing proper footwear, can make you more likely to have a fall. Screenings, including: Osteoporosis screening. Osteoporosis is a condition that causes the bones to get weaker and break more easily. Blood pressure screening. Blood pressure changes and medicines to control blood pressure can make you feel dizzy. Depression screening. You may be more likely to have a fall if you have a fear of falling, feel depressed, or feel unable to do activities that you used to do. Alcohol use screening. Using too much alcohol can affect your balance and may make you more likely to have a fall. Follow these instructions at home: Lifestyle Do not drink alcohol if: Your health care provider tells you not to drink. If you drink alcohol: Limit how much you have to: 0-1 drink a day for women. 0-2 drinks a day for men. Know how much alcohol is in your drink. In the [place] one drink equals one 12 oz bottle of beer (355 mL), one 5 oz glass of wine (148 mL), or one 1 oz glass of hard liquor (44 mL). Do not use any products that contain nicotine or tobacco. These products include cigarettes, chewing tobacco, and vaping devices, such as e-cigarettes. If you need help quitting, ask your health care provider. Activity  Follow a regular exercise program to stay fit. This will help you maintain your balance. Ask your health care provider what types of exercise are appropriate for you. If  you need a cane or walker, use it as recommended by your health care provider. Wear supportive shoes that have nonskid soles. Safety  Remove any tripping hazards, such as rugs, cords, and clutter. Install safety equipment such as grab bars in bathrooms and safety rails on stairs. Keep rooms and walkways well-lit. General instructions Talk with your health care provider about your risks for falling. Tell your health care provider if: You fall. Be sure to tell your health care provider about all falls, even ones that seem minor. You feel dizzy, tiredness (fatigue), or off-balance. Take over-the-counter and prescription medicines only as told by your health care provider. These include supplements. Eat a healthy diet and maintain a healthy weight. A healthy diet includes low-fat dairy products, low-fat (lean) meats, and fiber from whole grains, beans, and lots of fruits and vegetables. Stay current with your vaccines. Schedule regular health, dental, and eye exams. Summary Having a healthy lifestyle and getting preventive care can help to protect your health and wellness after age 61. Screening and testing are the best way to find a health problem early and help you avoid having a  fall. Early diagnosis and treatment give you the best chance for managing medical conditions that are more common for people who are older than age 68. Falls are a major cause of broken bones and head injuries in people who are older than age 83. Take precautions to prevent a fall at home. Work with your health care provider to learn what changes you can make to improve your health and wellness and to prevent falls. This information is not intended to replace advice given to you by your health care provider. Make sure you discuss any questions you have with your health care provider. Document Revised: 08/07/2020 Document Reviewed: 08/07/2020 Elsevier Patient Education  2024 Elsevier Inc.    Edwina Barth,  MD [facility]

## 2023-04-30 NOTE — Patient Instructions (Signed)
Health Maintenance After Age 82 After age 41, you are at a higher risk for certain long-term diseases and infections as well as injuries from falls. Falls are a major cause of broken bones and head injuries in people who are older than age 26. Getting regular preventive care can help to keep you healthy and well. Preventive care includes getting regular testing and making lifestyle changes as recommended by your health care provider. Talk with your health care provider about: Which screenings and tests you should have. A screening is a test that checks for a disease when you have no symptoms. A diet and exercise plan that is right for you. What should I know about screenings and tests to prevent falls? Screening and testing are the best ways to find a health problem early. Early diagnosis and treatment give you the best chance of managing medical conditions that are common after age 48. Certain conditions and lifestyle choices may make you more likely to have a fall. Your health care provider may recommend: Regular vision checks. Poor vision and conditions such as cataracts can make you more likely to have a fall. If you wear glasses, make sure to get your prescription updated if your vision changes. Medicine review. Work with your health care provider to regularly review all of the medicines you are taking, including over-the-counter medicines. Ask your health care provider about any side effects that may make you more likely to have a fall. Tell your health care provider if any medicines that you take make you feel dizzy or sleepy. Strength and balance checks. Your health care provider may recommend certain tests to check your strength and balance while standing, walking, or changing positions. Foot health exam. Foot pain and numbness, as well as not wearing proper footwear, can make you more likely to have a fall. Screenings, including: Osteoporosis screening. Osteoporosis is a condition that causes  the bones to get weaker and break more easily. Blood pressure screening. Blood pressure changes and medicines to control blood pressure can make you feel dizzy. Depression screening. You may be more likely to have a fall if you have a fear of falling, feel depressed, or feel unable to do activities that you used to do. Alcohol use screening. Using too much alcohol can affect your balance and may make you more likely to have a fall. Follow these instructions at home: Lifestyle Do not drink alcohol if: Your health care provider tells you not to drink. If you drink alcohol: Limit how much you have to: 0-1 drink a day for women. 0-2 drinks a day for men. Know how much alcohol is in your drink. In the [place] one drink equals one 12 oz bottle of beer (355 mL), one 5 oz glass of wine (148 mL), or one 1 oz glass of hard liquor (44 mL). Do not use any products that contain nicotine or tobacco. These products include cigarettes, chewing tobacco, and vaping devices, such as e-cigarettes. If you need help quitting, ask your health care provider. Activity  Follow a regular exercise program to stay fit. This will help you maintain your balance. Ask your health care provider what types of exercise are appropriate for you. If you need a cane or walker, use it as recommended by your health care provider. Wear supportive shoes that have nonskid soles. Safety  Remove any tripping hazards, such as rugs, cords, and clutter. Install safety equipment such as grab bars in bathrooms and safety rails on stairs. Keep rooms and walkways  well-lit. General instructions Talk with your health care provider about your risks for falling. Tell your health care provider if: You fall. Be sure to tell your health care provider about all falls, even ones that seem minor. You feel dizzy, tiredness (fatigue), or off-balance. Take over-the-counter and prescription medicines only as told by your health care provider. These include  supplements. Eat a healthy diet and maintain a healthy weight. A healthy diet includes low-fat dairy products, low-fat (lean) meats, and fiber from whole grains, beans, and lots of fruits and vegetables. Stay current with your vaccines. Schedule regular health, dental, and eye exams. Summary Having a healthy lifestyle and getting preventive care can help to protect your health and wellness after age 24. Screening and testing are the best way to find a health problem early and help you avoid having a fall. Early diagnosis and treatment give you the best chance for managing medical conditions that are more common for people who are older than age 81. Falls are a major cause of broken bones and head injuries in people who are older than age 75. Take precautions to prevent a fall at home. Work with your health care provider to learn what changes you can make to improve your health and wellness and to prevent falls. This information is not intended to replace advice given to you by your health care provider. Make sure you discuss any questions you have with your health care provider. Document Revised: 08/07/2020 Document Reviewed: 08/07/2020 Elsevier Patient Education  2024 [employer].

## 2023-04-30 NOTE — Assessment & Plan Note (Signed)
Stable chronic condition Continues low-dose rosuvastatin 2.5 mg daily Higher doses gave her musculoskeletal side effects

## 2023-04-30 NOTE — Assessment & Plan Note (Addendum)
Much improved.  No complications.  No concerns. About 95% improvement No abnormal physical findings.  No red flag signs or symptoms. Advised to reduce daily meclizine dose Fall precautions given

## 2023-05-05 ENCOUNTER — Ambulatory Visit: Payer: Medicare Other | Attending: Internal Medicine | Admitting: Physical Therapy

## 2023-05-05 ENCOUNTER — Encounter: Payer: Self-pay | Admitting: Physical Therapy

## 2023-05-05 DIAGNOSIS — M542 Cervicalgia: Secondary | ICD-10-CM | POA: Insufficient documentation

## 2023-05-05 DIAGNOSIS — R42 Dizziness and giddiness: Secondary | ICD-10-CM | POA: Insufficient documentation

## 2023-05-05 NOTE — Therapy (Signed)
OUTPATIENT PHYSICAL THERAPY VESTIBULAR TREATMENT     Patient Name: Stephanie Aguilar MRN: [id number] DOB:December 20, 1940, 83 y.o., female Today's Date: 05/05/2023  END OF SESSION:  PT End of Session - 05/05/23 0927     Visit Number 2    Number of Visits 8    Date for PT Re-Evaluation 05/27/23    Authorization Type [employer] Medicare    PT Start Time [phone]    PT Stop Time 1013    PT Time Calculation (min) 40 min    Activity Tolerance Patient tolerated treatment well    Behavior During Therapy WFL for tasks assessed/performed              Past Medical History:  Diagnosis Date   Allergy    Anxiety    Cataract    Depression    Hyperlipidemia    Hypertension    Past Surgical History:  Procedure Laterality Date   ABDOMINAL HYSTERECTOMY     APPENDECTOMY     BREAST SURGERY     DENTAL SURGERY     TONSILLECTOMY AND ADENOIDECTOMY     Patient Active Problem List   Diagnosis Date Noted   Benign paroxysmal positional vertigo due to bilateral vestibular disorder 04/30/2023   Peripheral vertigo involving left ear 04/21/2023   CKD (chronic kidney disease), stage III (HCC) 04/13/2016   PAD (peripheral artery disease) (HCC) 04/12/2016   Anxiety disorder 11/07/2015   Carotid bruit 07/06/2013   Essential hypertension 02/04/2012   Dyslipidemia 02/04/2012   Menopausal syndrome on hormone replacement therapy 02/04/2012    PCP: Tressa Busman, MD REFERRING PROVIDER: Lorin Glass, MD  REFERRING DIAG: H81.12 (ICD-10-CM) - Benign paroxysmal positional vertigo of left ear  THERAPY DIAG:  Dizziness and giddiness  Cervicalgia  ONSET DATE: 7 days ago  Rationale for Evaluation and Treatment: Rehabilitation  SUBJECTIVE:   SUBJECTIVE STATEMENT: Feel like I'm doing better.  Still feel a little unbalanced a few times.  I have cut back on the Meclizine, from 3 to 2.  The neck is still bothering me some.  Pt accompanied by: significant other  PERTINENT HISTORY:  PMH significant for HTN, HLD, anxiety/depression. Patient presented to the ED at [facility] yesterday 1/20 with complaint of dizziness with nausea upon getting up in the morning.  She has been experiencing it for the last 5 days every morning.  She feels the room spins around her for a short time.  The day prior to presentation, it happened when she was having a photo as he had to sit down.  PAIN:  Are you having pain? Yes: NPRS scale: 7-8/10 Pain location: L neck, upper back, shoulders Pain description: sharp Aggravating factors: neck rotation, reaching Relieving factors: slow movements, avoidance  PRECAUTIONS: None  RED FLAGS: None   WEIGHT BEARING RESTRICTIONS: No  FALLS: Has patient fallen in last 6 months? No  LIVING ENVIRONMENT: Lives with: lives with their spouse Lives in: House/apartment, 2nd floor BR Stairs: exterior/interior w/ HR Has following equipment at home: None, has a shower chair and walker at her disposal  PLOF: Independent, walks for exercise, yard work  PATIENT GOALS: eliminate symptoms  OBJECTIVE:   TODAY'S TREATMENT: 05/05/2023 Activity Comments  Review of HEP: Good form  Supine neck retraction 2 x 5 Supine scapular retraction 2 x 5   Manual therapy: STM to L Upper traps supine and seated position Feels good relief  A/ROM stretch with lateral flexion to R  Levator stretch to R shoulder Feels significant stretch  Towel  stretch for neck rotation, 3 x 10 sec Good form with cues  Quarter turns, L and R Cues to focus on visual target  HOME EXERCISE PROGRAM:  Access Code: [email or web address] URL: [email or web address] Date: 05/05/2023 Prepared by: [facility] - Outpatient  Rehab - [facility]  Exercises - Cervical Extension AROM with Strap  - 1 x daily - 7 x weekly - 3 sets - 10 reps - Mid-Lower Cervical Extension SNAG with Strap  - 1 x daily - 7 x weekly - 3 sets - 10 reps - Standing Lower Cervical and Upper Thoracic Stretch  - 1 x daily - 7 x  weekly - 3 sets - 10 reps - Supine Isometric Neck Extension  - 1 x daily - 7 x weekly - 1-3 sets - 10 reps - 2 sec hold - Seated Assisted Cervical Rotation with Towel  - 1-2 x daily - 7 x weekly - 1 sets - 3-5 reps - 15-30 sec hold  M-CTSIB  Condition 1: Firm Surface, EO 30 Sec, Normal Sway  Condition 2: Firm Surface, EC 30 Sec, Mild Sway  Condition 3: Foam Surface, EO 30 Sec, Mild Sway  Condition 4: Foam Surface, EC 30 Sec, Moderate Sway   PATIENT EDUCATION: Education details: HEP addition; rationale for MCTSIB testing and balance work in relation to her recent vertigo Person educated: Patient Education method: Programmer, multimedia, Facilities manager, Verbal cues, and Handouts Education comprehension: verbalized understanding, returned demonstration, and needs further education  -------------------------------------------------------------- Note: Objective measures were completed at Evaluation unless otherwise noted.  DIAGNOSTIC FINDINGS:   COGNITION: Overall cognitive status: Within functional limits for tasks assessed   SENSATION: WFL  EDEMA:    PALPATION:  tenderness and trigger points appreciated along medial scapular border R > L   DTRs:  NT  POSTURE:  No Significant postural limitations  Cervical ROM:    Active A/PROM (deg) eval  Flexion 60  Extension 20  Right lateral flexion 15  Left lateral flexion 15  Right rotation 25  Left rotation 25  (Blank rows = not tested)  STRENGTH:   LOWER EXTREMITY MMT:   MMT Right eval Left eval  Hip flexion    Hip abduction    Hip adduction    Hip internal rotation    Hip external rotation    Knee flexion    Knee extension    Ankle dorsiflexion    Ankle plantarflexion    Ankle inversion    Ankle eversion    (Blank rows = not tested)  BED MOBILITY:  indep  TRANSFERS: Indep    GAIT: Gait pattern:  decreased speed due to guarding from symptoms Distance walked:  Assistive device utilized: None Level of  assistance: Complete Independence Comments:     VESTIBULAR ASSESSMENT:  GENERAL OBSERVATION: wears progressive lens glasses   SYMPTOM BEHAVIOR:  Subjective history: 7 day hx of positional vertigo  Non-Vestibular symptoms: neck pain  Type of dizziness: Spinning/Vertigo  Frequency: daily  Duration: seconds-minutes   Aggravating factors: Induced by position change: lying supine, rolling to the left, and supine to sit  Relieving factors: slow movements  Progression of symptoms: better  OCULOMOTOR EXAM:  Ocular Alignment: normal  Ocular ROM: No Limitations  Spontaneous Nystagmus: absent  Gaze-Induced Nystagmus: absent  Smooth Pursuits: intact  Saccades: slow  Convergence/Divergence:  cm    VESTIBULAR - OCULAR REFLEX:   Slow VOR: Comment: limited ROM due to neck pain  VOR Cancellation: Comment: slow, notes some provocation of symptoms  Head-Impulse Test: unable to perform  due to neck pain  Dynamic Visual Acuity: Not able to be assessed   POSITIONAL TESTING: Right Dix-Hallpike: no nystagmus Left Dix-Hallpike: no nystagmus Right Roll Test: no nystagmus Left Roll Test: no nystagmus  MOTION SENSITIVITY:  Motion Sensitivity Quotient Intensity: 0 = none, 1 = Lightheaded, 2 = Mild, 3 = Moderate, 4 = Severe, 5 = Vomiting  Intensity  1. Sitting to supine   2. Supine to L side   3. Supine to R side   4. Supine to sitting   5. L Hallpike-Dix   6. Up from L    7. R Hallpike-Dix   8. Up from R    9. Sitting, head tipped to L knee   10. Head up from L knee   11. Sitting, head tipped to R knee   12. Head up from R knee   13. Sitting head turns x5   14.Sitting head nods x5   15. In stance, 180 turn to L    16. In stance, 180 turn to R     OTHOSTATICS: not done  FUNCTIONAL GAIT: Functional gait assessment: TBD M-CTSIB: TBD                                                                                                                            TREATMENT DATE:  04/29/23    PATIENT EDUCATION: Education details: assessment details, HEP initiation Person educated: Patient and Spouse Education method: Explanation and Handouts Education comprehension: needs further education  HOME EXERCISE PROGRAM:  Access Code: [email or web address] URL: [email or web address] Date: 04/29/2023 Prepared by: Shary Decamp  Exercises - Cervical Extension AROM with Strap  - 1 x daily - 7 x weekly - 3 sets - 10 reps - Mid-Lower Cervical Extension SNAG with Strap  - 1 x daily - 7 x weekly - 3 sets - 10 reps - Standing Lower Cervical and Upper Thoracic Stretch  - 1 x daily - 7 x weekly - 3 sets - 10 reps - Supine Isometric Neck Extension  - 1 x daily - 7 x weekly - 1-3 sets - 10 reps - 2 sec hold  GOALS: Goals reviewed with patient? Yes  SHORT TERM GOALS: Target date: same as LTG    LONG TERM GOALS: Target date: 05/27/2023    Patient will be independent in HEP to improve functional outcomes Baseline:  Goal status: INITIAL  2.  Demo improved cervical spine ROM to 35 degrees rotation to improve comfort/safety when driving Baseline: 25 degrees R/L Goal status: INITIAL  3.  Demo score of 25/30 Functional Gait Assessment to manifest improved balance and reduced risk for falls Baseline: TBD Goal status: INITIAL  4.  Pt to report neck pain not exceeding 2/10 during daily activities to improve comfort and activity tolerance Baseline: 7-8/10 with sharp pains with certain movements/activities Goal status: INITIAL    ASSESSMENT:  CLINICAL IMPRESSION: Pt presents today with no new complaints. Skilled PT session focused on manual therapy and exercise  for neck pain; pt feels better at end of session. Pt does report continued unsteadiness; moderate sway noted on MCTSIB.  Pt will continue to benefit from skilled PT towards goals for improved functional mobility and decreased fall risk.   OBJECTIVE IMPAIRMENTS: Abnormal gait, decreased activity tolerance, decreased  balance, decreased ROM, dizziness, hypomobility, increased muscle spasms, and pain.   ACTIVITY LIMITATIONS: carrying, lifting, bending, reach over head, and locomotion level  PARTICIPATION LIMITATIONS: meal prep, cleaning, laundry, driving, shopping, community activity, yard work, and exercise routine  PERSONAL FACTORS: Age, Time since onset of injury/illness/exacerbation, and 1 comorbidity: PMH  are also affecting patient's functional outcome.   REHAB POTENTIAL: Excellent  CLINICAL DECISION MAKING: Evolving/moderate complexity  EVALUATION COMPLEXITY: Moderate   PLAN:  PT FREQUENCY: 1-2x/week  PT DURATION: 4 weeks  PLANNED INTERVENTIONS: 97164- PT Re-evaluation, 97110-Therapeutic exercises, 97530- Therapeutic activity, [id number]- Neuromuscular re-education, 97535- Self Care, 16109- Manual therapy, [id number]- Gait training, [phone]- Canalith repositioning, [id number]- Aquatic Therapy, [phone]- Electrical stimulation (manual), Patient/Family education, Balance training, Taping, Dry Needling, Joint mobilization, Spinal mobilization, Vestibular training, DME instructions, Cryotherapy, and Moist heat  PLAN FOR NEXT SESSION: HEP review, manual therapy to neck, treat positional dizziness as indicated; balance on compliant surfaces.   Lonia Blood, PT 05/05/23 10:16 AM Phone: [phone] Fax: [phone]  [facility] at [facility] [address], [address] [place], [place] [address] Phone # [phone] Fax # [phone]

## 2023-05-07 NOTE — Therapy (Signed)
 OUTPATIENT PHYSICAL THERAPY VESTIBULAR TREATMENT     Patient Name: Stephanie Aguilar MRN: [id number] DOB:[birth year], 83 y.o., female Today's Date: 05/09/2023  END OF SESSION:  PT End of Session - 05/09/23 1011     Visit Number 3    Number of Visits 8    Date for PT Re-Evaluation 05/27/23    Authorization Type [employer] Medicare    PT Start Time [phone]    PT Stop Time 1010    PT Time Calculation (min) 43 min    Activity Tolerance Patient tolerated treatment well    Behavior During Therapy WFL for tasks assessed/performed               Past Medical History:  Diagnosis Date   Allergy    Anxiety    Cataract    Depression    Hyperlipidemia    Hypertension    Past Surgical History:  Procedure Laterality Date   ABDOMINAL HYSTERECTOMY     APPENDECTOMY     BREAST SURGERY     DENTAL SURGERY     TONSILLECTOMY AND ADENOIDECTOMY     Patient Active Problem List   Diagnosis Date Noted   Benign paroxysmal positional vertigo due to bilateral vestibular disorder 04/30/2023   Peripheral vertigo involving left ear 04/21/2023   CKD (chronic kidney disease), stage III (HCC) 04/13/2016   PAD (peripheral artery disease) (HCC) 04/12/2016   Anxiety disorder 11/07/2015   Carotid bruit 07/06/2013   Essential hypertension 02/04/2012   Dyslipidemia 02/04/2012   Menopausal syndrome on hormone replacement therapy 02/04/2012    PCP: Purcell Emil Awe, MD REFERRING PROVIDER: Arlice Reichert, MD  REFERRING DIAG: H81.12 (ICD-10-CM) - Benign paroxysmal positional vertigo of left ear  THERAPY DIAG:  Dizziness and giddiness  Cervicalgia  ONSET DATE: 7 days ago  Rationale for Evaluation and Treatment: Rehabilitation  SUBJECTIVE:   SUBJECTIVE STATEMENT: Overall my back is way better since I first came, but my neck is still hurting if I do anything. I have done the exercises every day and they really help. The motion that really hurts is bending down to feed my cats or  turning head driving. Wondering if the Epley could have flared up her neck.   Pt accompanied by: significant other  PERTINENT HISTORY: PMH significant for HTN, HLD, anxiety/depression. Patient presented to the ED at [facility] yesterday 1/20 with complaint of dizziness with nausea upon getting up in the morning.  She has been experiencing it for the last 5 days every morning.  She feels the room spins around her for a short time.  The day prior to presentation, it happened when she was having a photo as he had to sit down.  PAIN:  Are you having pain? Yes: NPRS scale: 5-6/10 Pain location: L neck, upper back, shoulders Pain description: sharp Aggravating factors: neck rotation, reaching Relieving factors: slow movements, avoidance  PRECAUTIONS: None  RED FLAGS: None   WEIGHT BEARING RESTRICTIONS: No  FALLS: Has patient fallen in last 6 months? No  LIVING ENVIRONMENT: Lives with: lives with their spouse Lives in: House/apartment, 2nd floor BR Stairs: exterior/interior w/ HR Has following equipment at home: None, has a shower chair and walker at her disposal  PLOF: Independent, walks for exercise, yard work  PATIENT GOALS: eliminate symptoms  OBJECTIVE:      TODAY'S TREATMENT: 05/09/23 Activity Comments  HEP review: cervical extension SNAG 10x  cervical rotation SNAG standing rhomboid stretch 10 supine isometric cervical extension 5x5 Required corrective cueing for positioning with  SNAGs and encouraged performing neck rotation to B sides as she was performing only to R; Good form with other exercises. Cues to depress shoulders   STM and manual TPR to L cervical musculature  Significant soft tissue restriction and tender trigger pts in L UT, scalenes, LS; pt reported good relief      PATIENT EDUCATION: Education details: edu and handout on DN, answered pt's questions on what exercises to do if she if she has a flare of neck pain, answered pt's questions on different  types of dizziness, PT schedule  Person educated: Patient Education method: Explanation, Demonstration, Tactile cues, Verbal cues, and Handouts Education comprehension: verbalized understanding and returned demonstration    HOME EXERCISE PROGRAM:  Access Code: [email or web address] URL: [email or web address] Date: 05/05/2023 Prepared by: [facility] - Outpatient  Rehab - [facility]  Exercises - Cervical Extension AROM with Strap  - 1 x daily - 7 x weekly - 3 sets - 10 reps - Mid-Lower Cervical Extension SNAG with Strap  - 1 x daily - 7 x weekly - 3 sets - 10 reps - Standing Lower Cervical and Upper Thoracic Stretch  - 1 x daily - 7 x weekly - 3 sets - 10 reps - Supine Isometric Neck Extension  - 1 x daily - 7 x weekly - 1-3 sets - 10 reps - 2 sec hold - Seated Assisted Cervical Rotation with Towel  - 1-2 x daily - 7 x weekly - 1 sets - 3-5 reps - 15-30 sec hold   -------------------------------------------------------------- Note: Objective measures were completed at Evaluation unless otherwise noted.  DIAGNOSTIC FINDINGS:   COGNITION: Overall cognitive status: Within functional limits for tasks assessed   SENSATION: WFL  EDEMA:    PALPATION:  tenderness and trigger points appreciated along medial scapular border R > L   DTRs:  NT  POSTURE:  No Significant postural limitations  Cervical ROM:    Active A/PROM (deg) eval  Flexion 60  Extension 20  Right lateral flexion 15  Left lateral flexion 15  Right rotation 25  Left rotation 25  (Blank rows = not tested)  STRENGTH:   LOWER EXTREMITY MMT:   MMT Right eval Left eval  Hip flexion    Hip abduction    Hip adduction    Hip internal rotation    Hip external rotation    Knee flexion    Knee extension    Ankle dorsiflexion    Ankle plantarflexion    Ankle inversion    Ankle eversion    (Blank rows = not tested)  BED MOBILITY:  indep  TRANSFERS: Indep    GAIT: Gait pattern:   decreased speed due to guarding from symptoms Distance walked:  Assistive device utilized: None Level of assistance: Complete Independence Comments:     VESTIBULAR ASSESSMENT:  GENERAL OBSERVATION: wears progressive lens glasses   SYMPTOM BEHAVIOR:  Subjective history: 7 day hx of positional vertigo  Non-Vestibular symptoms: neck pain  Type of dizziness: Spinning/Vertigo  Frequency: daily  Duration: seconds-minutes   Aggravating factors: Induced by position change: lying supine, rolling to the left, and supine to sit  Relieving factors: slow movements  Progression of symptoms: better  OCULOMOTOR EXAM:  Ocular Alignment: normal  Ocular ROM: No Limitations  Spontaneous Nystagmus: absent  Gaze-Induced Nystagmus: absent  Smooth Pursuits: intact  Saccades: slow  Convergence/Divergence:  cm    VESTIBULAR - OCULAR REFLEX:   Slow VOR: Comment: limited ROM due to neck pain  VOR Cancellation: Comment: slow,  notes some provocation of symptoms  Head-Impulse Test: unable to perform due to neck pain  Dynamic Visual Acuity: Not able to be assessed   POSITIONAL TESTING: Right Dix-Hallpike: no nystagmus Left Dix-Hallpike: no nystagmus Right Roll Test: no nystagmus Left Roll Test: no nystagmus  MOTION SENSITIVITY:  Motion Sensitivity Quotient Intensity: 0 = none, 1 = Lightheaded, 2 = Mild, 3 = Moderate, 4 = Severe, 5 = Vomiting  Intensity  1. Sitting to supine   2. Supine to L side   3. Supine to R side   4. Supine to sitting   5. L Hallpike-Dix   6. Up from L    7. R Hallpike-Dix   8. Up from R    9. Sitting, head tipped to L knee   10. Head up from L knee   11. Sitting, head tipped to R knee   12. Head up from R knee   13. Sitting head turns x5   14.Sitting head nods x5   15. In stance, 180 turn to L    16. In stance, 180 turn to R     OTHOSTATICS: not done  FUNCTIONAL GAIT: Functional gait assessment: TBD M-CTSIB: TBD                                                                                                                             TREATMENT DATE: 04/29/23    PATIENT EDUCATION: Education details: assessment details, HEP initiation Person educated: Patient and Spouse Education method: Explanation and Handouts Education comprehension: needs further education  HOME EXERCISE PROGRAM:  Access Code: [email or web address] URL: [email or web address] Date: 04/29/2023 Prepared by: Burnard Sandifer  Exercises - Cervical Extension AROM with Strap  - 1 x daily - 7 x weekly - 3 sets - 10 reps - Mid-Lower Cervical Extension SNAG with Strap  - 1 x daily - 7 x weekly - 3 sets - 10 reps - Standing Lower Cervical and Upper Thoracic Stretch  - 1 x daily - 7 x weekly - 3 sets - 10 reps - Supine Isometric Neck Extension  - 1 x daily - 7 x weekly - 1-3 sets - 10 reps - 2 sec hold  GOALS: Goals reviewed with patient? Yes  SHORT TERM GOALS: Target date: same as LTG    LONG TERM GOALS: Target date: 05/27/2023    Patient will be independent in HEP to improve functional outcomes Baseline:  Goal status: IN PROGRESS  2.  Demo improved cervical spine ROM to 35 degrees rotation to improve comfort/safety when driving Baseline: 25 degrees R/L Goal status: IN PROGRESS  3.  Demo score of 25/30 Functional Gait Assessment to manifest improved balance and reduced risk for falls Baseline: TBD Goal status: IN PROGRESS  4.  Pt to report neck pain not exceeding 2/10 during daily activities to improve comfort and activity tolerance Baseline: 7-8/10 with sharp pains with certain movements/activities Goal status: IN PROGRESS    ASSESSMENT:  CLINICAL IMPRESSION: Patient  arrived to session with report of improved back pain but remaining neck pain; worse with bending and turning activities. Reviewed HEP which required minor corrective cueing. Patient tolerated all exercises well; did require reminders to be mindful of muscle guarding/elevated shoulders. Responded  well to MT to address soft tissue restriction and pain and was educated on DN as this may be a modality that patient may benefit from. Patient reported benefit from MT upon leaving session.   OBJECTIVE IMPAIRMENTS: Abnormal gait, decreased activity tolerance, decreased balance, decreased ROM, dizziness, hypomobility, increased muscle spasms, and pain.   ACTIVITY LIMITATIONS: carrying, lifting, bending, reach over head, and locomotion level  PARTICIPATION LIMITATIONS: meal prep, cleaning, laundry, driving, shopping, community activity, yard work, and exercise routine  PERSONAL FACTORS: Age, Time since onset of injury/illness/exacerbation, and 1 comorbidity: PMH  are also affecting patient's functional outcome.   REHAB POTENTIAL: Excellent  CLINICAL DECISION MAKING: Evolving/moderate complexity  EVALUATION COMPLEXITY: Moderate   PLAN:  PT FREQUENCY: 1-2x/week  PT DURATION: 4 weeks  PLANNED INTERVENTIONS: 97164- PT Re-evaluation, 97110-Therapeutic exercises, 97530- Therapeutic activity, [id number]- Neuromuscular re-education, 97535- Self Care, [id number]- Manual therapy, [id number]- Gait training, [phone]- Canalith repositioning, [id number]- Aquatic Therapy, [phone]- Electrical stimulation (manual), Patient/Family education, Balance training, Taping, Dry Needling, Joint mobilization, Spinal mobilization, Vestibular training, DME instructions, Cryotherapy, and Moist heat  PLAN FOR NEXT SESSION: DN? manual therapy to neck, treat positional dizziness as indicated; balance on compliant surfaces.   Louana Terrilyn Christians, PT, DPT 05/09/23 10:13 AM  [facility] at [facility] [address] [place], [address] [place], [place] [address] Phone # [phone] Fax # [phone]

## 2023-05-09 ENCOUNTER — Ambulatory Visit: Payer: Medicare Other | Admitting: Physical Therapy

## 2023-05-09 ENCOUNTER — Encounter: Payer: Self-pay | Admitting: Physical Therapy

## 2023-05-09 DIAGNOSIS — M542 Cervicalgia: Secondary | ICD-10-CM

## 2023-05-09 DIAGNOSIS — R42 Dizziness and giddiness: Secondary | ICD-10-CM | POA: Diagnosis not present

## 2023-05-09 NOTE — Patient Instructions (Addendum)

## 2023-05-12 ENCOUNTER — Ambulatory Visit: Payer: Medicare Other

## 2023-05-12 DIAGNOSIS — R42 Dizziness and giddiness: Secondary | ICD-10-CM

## 2023-05-12 DIAGNOSIS — M542 Cervicalgia: Secondary | ICD-10-CM

## 2023-05-12 NOTE — Therapy (Signed)
 OUTPATIENT PHYSICAL THERAPY VESTIBULAR TREATMENT     Patient Name: Stephanie Aguilar MRN: [id number] DOB:1941/03/19, 83 y.o., female Today's Date: 05/12/2023  END OF SESSION:  PT End of Session - 05/12/23 1013     Visit Number 4    Number of Visits 8    Date for PT Re-Evaluation 05/27/23    Authorization Type [employer]    PT Start Time 1015    PT Stop Time 1100    PT Time Calculation (min) 45 min    Activity Tolerance Patient tolerated treatment well    Behavior During Therapy WFL for tasks assessed/performed               Past Medical History:  Diagnosis Date   Allergy    Anxiety    Cataract    Depression    Hyperlipidemia    Hypertension    Past Surgical History:  Procedure Laterality Date   ABDOMINAL HYSTERECTOMY     APPENDECTOMY     BREAST SURGERY     DENTAL SURGERY     TONSILLECTOMY AND ADENOIDECTOMY     Patient Active Problem List   Diagnosis Date Noted   Benign paroxysmal positional vertigo due to bilateral vestibular disorder 04/30/2023   Peripheral vertigo involving left ear 04/21/2023   CKD (chronic kidney disease), stage III (HCC) 04/13/2016   PAD (peripheral artery disease) (HCC) 04/12/2016   Anxiety disorder 11/07/2015   Carotid bruit 07/06/2013   Essential hypertension 02/04/2012   Dyslipidemia 02/04/2012   Menopausal syndrome on hormone replacement therapy 02/04/2012    PCP: Edilia Gordon, MD REFERRING PROVIDER: Hoyt Macleod, MD  REFERRING DIAG: H81.12 (ICD-10-CM) - Benign paroxysmal positional vertigo of left ear  THERAPY DIAG:  Dizziness and giddiness  Cervicalgia  ONSET DATE: 7 days ago  Rationale for Evaluation and Treatment: Rehabilitation  SUBJECTIVE:   SUBJECTIVE STATEMENT: Overall my back and shoulder are way better.  Neck is still problematic with rotation and forward bending, a sharp pain.  No significant episodes of vertigo/dizziness. Noting some motion sensitivity when first arising  in the AM and instances when in a busy environment.    Pt accompanied by: significant other  PERTINENT HISTORY: PMH significant for HTN, HLD, anxiety/depression. Patient presented to the ED at [facility] yesterday 1/20 with complaint of dizziness with nausea upon getting up in the morning.  She has been experiencing it for the last 5 days every morning.  She feels the room spins around her for a short time.  The day prior to presentation, it happened when she was having a photo as he had to sit down.  PAIN:  Are you having pain? Yes: NPRS scale: 5-6/10 Pain location: L neck, upper back, shoulders Pain description: sharp Aggravating factors: neck rotation, reaching Relieving factors: slow movements, avoidance  PRECAUTIONS: None  RED FLAGS: None   WEIGHT BEARING RESTRICTIONS: No  FALLS: Has patient fallen in last 6 months? No  LIVING ENVIRONMENT: Lives with: lives with their spouse Lives in: House/apartment, 2nd floor BR Stairs: exterior/interior w/ HR Has following equipment at home: None, has a shower chair and walker at her disposal  PLOF: Independent, walks for exercise, yard work  PATIENT GOALS: eliminate symptoms  OBJECTIVE:   TODAY'S TREATMENT: 05/12/23 Activity Comments  Supine with MHP to neck x 5 min   STM Soft tissue mobilization to bilat cervical column, upper trap, levator scalene to address trigger point pain and limitde ROM. Right soft tissue restrictions more prominent  Joint mobilization Grade  2,3,4 mobilizations cervical facets for improved downglide/extension to facilitate ROM/rotation/lateral flexion Greater hypomobility appreciated right > left  Review of HEP for ROM/self-mobilization               TODAY'S TREATMENT: 05/09/23 Activity Comments  HEP review: cervical extension SNAG 10x  cervical rotation SNAG standing rhomboid stretch 10" supine isometric cervical extension 5x5" Required corrective cueing for positioning with SNAGs and encouraged  performing neck rotation to B sides as she was performing only to R; Good form with other exercises. Cues to depress shoulders   STM and manual TPR to L cervical musculature  Significant soft tissue restriction and tender trigger pts in L UT, scalenes, LS; pt reported good relief      PATIENT EDUCATION: Education details: edu and handout on DN, answered pt's questions on what exercises to do if she if she has a flare of neck pain, answered pt's questions on different types of dizziness, PT schedule  Person educated: Patient Education method: Explanation, Demonstration, Tactile cues, Verbal cues, and Handouts Education comprehension: verbalized understanding and returned demonstration    HOME EXERCISE PROGRAM:  Access Code: [email or web address] URL: [email or web address] Date: 05/05/2023 Prepared by: [facility] - Outpatient  Rehab - [facility]  Exercises - Cervical Extension AROM with Strap  - 1 x daily - 7 x weekly - 3 sets - 10 reps - Mid-Lower Cervical Extension SNAG with Strap  - 1 x daily - 7 x weekly - 3 sets - 10 reps - Standing Lower Cervical and Upper Thoracic Stretch  - 1 x daily - 7 x weekly - 3 sets - 10 reps - Supine Isometric Neck Extension  - 1 x daily - 7 x weekly - 1-3 sets - 10 reps - 2 sec hold - Seated Assisted Cervical Rotation with Towel  - 1-2 x daily - 7 x weekly - 1 sets - 3-5 reps - 15-30 sec hold   -------------------------------------------------------------- Note: Objective measures were completed at Evaluation unless otherwise noted.  DIAGNOSTIC FINDINGS:   COGNITION: Overall cognitive status: Within functional limits for tasks assessed   SENSATION: WFL  EDEMA:    PALPATION:  tenderness and trigger points appreciated along medial scapular border R > L   DTRs:  NT  POSTURE:  No Significant postural limitations  Cervical ROM:    Active A/PROM (deg) eval  Flexion 60  Extension 20  Right lateral flexion 15  Left lateral flexion  15  Right rotation 25  Left rotation 25  (Blank rows = not tested)  STRENGTH:   LOWER EXTREMITY MMT:   MMT Right eval Left eval  Hip flexion    Hip abduction    Hip adduction    Hip internal rotation    Hip external rotation    Knee flexion    Knee extension    Ankle dorsiflexion    Ankle plantarflexion    Ankle inversion    Ankle eversion    (Blank rows = not tested)  BED MOBILITY:  indep  TRANSFERS: Indep    GAIT: Gait pattern:  decreased speed due to guarding from symptoms Distance walked:  Assistive device utilized: None Level of assistance: Complete Independence Comments:     VESTIBULAR ASSESSMENT:  GENERAL OBSERVATION: wears progressive lens glasses   SYMPTOM BEHAVIOR:  Subjective history: 7 day hx of positional vertigo  Non-Vestibular symptoms: neck pain  Type of dizziness: Spinning/Vertigo  Frequency: daily  Duration: seconds-minutes   Aggravating factors: Induced by position change: lying supine, rolling to the left,  and supine to sit  Relieving factors: slow movements  Progression of symptoms: better  OCULOMOTOR EXAM:  Ocular Alignment: normal  Ocular ROM: No Limitations  Spontaneous Nystagmus: absent  Gaze-Induced Nystagmus: absent  Smooth Pursuits: intact  Saccades: slow  Convergence/Divergence:  cm    VESTIBULAR - OCULAR REFLEX:   Slow VOR: Comment: limited ROM due to neck pain  VOR Cancellation: Comment: slow, notes some provocation of symptoms  Head-Impulse Test: unable to perform due to neck pain  Dynamic Visual Acuity: Not able to be assessed   POSITIONAL TESTING: Right Dix-Hallpike: no nystagmus Left Dix-Hallpike: no nystagmus Right Roll Test: no nystagmus Left Roll Test: no nystagmus  MOTION SENSITIVITY:  Motion Sensitivity Quotient Intensity: 0 = none, 1 = Lightheaded, 2 = Mild, 3 = Moderate, 4 = Severe, 5 = Vomiting  Intensity  1. Sitting to supine   2. Supine to L side   3. Supine to R side   4. Supine to  sitting   5. L Hallpike-Dix   6. Up from L    7. R Hallpike-Dix   8. Up from R    9. Sitting, head tipped to L knee   10. Head up from L knee   11. Sitting, head tipped to R knee   12. Head up from R knee   13. Sitting head turns x5   14.Sitting head nods x5   15. In stance, 180 turn to L    16. In stance, 180 turn to R     OTHOSTATICS: not done  FUNCTIONAL GAIT: Functional gait assessment: TBD M-CTSIB: TBD                                                                                                                            TREATMENT DATE: 04/29/23    PATIENT EDUCATION: Education details: assessment details, HEP initiation Person educated: Patient and Spouse Education method: Explanation and Handouts Education comprehension: needs further education  HOME EXERCISE PROGRAM:  Access Code: [email or web address] URL: [email or web address] Date: 04/29/2023 Prepared by: Tedd Favorite  Exercises - Cervical Extension AROM with Strap  - 1 x daily - 7 x weekly - 3 sets - 10 reps - Mid-Lower Cervical Extension SNAG with Strap  - 1 x daily - 7 x weekly - 3 sets - 10 reps - Standing Lower Cervical and Upper Thoracic Stretch  - 1 x daily - 7 x weekly - 3 sets - 10 reps - Supine Isometric Neck Extension  - 1 x daily - 7 x weekly - 1-3 sets - 10 reps - 2 sec hold  GOALS: Goals reviewed with patient? Yes  SHORT TERM GOALS: Target date: same as LTG    LONG TERM GOALS: Target date: 05/27/2023    Patient will be independent in HEP to improve functional outcomes Baseline:  Goal status: IN PROGRESS  2.  Demo improved cervical spine ROM to 35 degrees rotation to improve comfort/safety when driving Baseline: 25 degrees R/L Goal  status: IN PROGRESS  3.  Demo score of 25/30 Functional Gait Assessment to manifest improved balance and reduced risk for falls Baseline: TBD Goal status: IN PROGRESS  4.  Pt to report neck pain not exceeding 2/10 during daily activities to improve  comfort and activity tolerance Baseline: 7-8/10 with sharp pains with certain movements/activities Goal status: IN PROGRESS    ASSESSMENT:  CLINICAL IMPRESSION: Ongoing neck pain with active movement such as rotation and forward bending w/ episodes described as sharp. Tx focus on soft tissue mobilization to address restrictions/dysfunction with greater degree of palpable trigger point R > L despite more pain experienced on left side of neck. Joint mobilizations to reduce pain and improve mobility, again with greater hypomobility noted R vs L.  Pt reports reduced pain post-tx to 4/10 with active movements. She reports some ongoing symptoms of motion sensitivity greater in the AM and when navigating busy/crowded environments.  Continued sessions indicated to progress POC details to improve mobility and reduce pain.   OBJECTIVE IMPAIRMENTS: Abnormal gait, decreased activity tolerance, decreased balance, decreased ROM, dizziness, hypomobility, increased muscle spasms, and pain.   ACTIVITY LIMITATIONS: carrying, lifting, bending, reach over head, and locomotion level  PARTICIPATION LIMITATIONS: meal prep, cleaning, laundry, driving, shopping, community activity, yard work, and exercise routine  PERSONAL FACTORS: Age, Time since onset of injury/illness/exacerbation, and 1 comorbidity: PMH  are also affecting patient's functional outcome.   REHAB POTENTIAL: Excellent  CLINICAL DECISION MAKING: Evolving/moderate complexity  EVALUATION COMPLEXITY: Moderate   PLAN:  PT FREQUENCY: 1-2x/week  PT DURATION: 4 weeks  PLANNED INTERVENTIONS: 97164- PT Re-evaluation, 97110-Therapeutic exercises, 97530- Therapeutic activity, [id number]- Neuromuscular re-education, 97535- Self Care, 09811- Manual therapy, [id number]- Gait training, [phone]- Canalith repositioning, [id number]- Aquatic Therapy, [phone]- Electrical stimulation (manual), Patient/Family education, Balance training, Taping, Dry Needling, Joint mobilization,  Spinal mobilization, Vestibular training, DME instructions, Cryotherapy, and Moist heat  PLAN FOR NEXT SESSION: DN? manual therapy to neck, treat positional dizziness as indicated; balance on compliant surfaces.   12:39 PM, 05/12/23 M. Kelly Caris Cerveny, PT, DPT Physical Therapist- [place] Office Number: [phone]

## 2023-05-14 NOTE — Therapy (Signed)
OUTPATIENT PHYSICAL THERAPY VESTIBULAR TREATMENT     Patient Name: Stephanie Aguilar MRN: [id number] DOB:04-08-1940, 83 y.o., female Today's Date: 05/16/2023  END OF SESSION:  PT End of Session - 05/16/23 1150     Visit Number 5    Number of Visits 8    Date for PT Re-Evaluation 05/27/23    Authorization Type [employer]    PT Start Time 1018    PT Stop Time 1101    PT Time Calculation (min) 43 min    Activity Tolerance Patient tolerated treatment well    Behavior During Therapy WFL for tasks assessed/performed                Past Medical History:  Diagnosis Date   Allergy    Anxiety    Cataract    Depression    Hyperlipidemia    Hypertension    Past Surgical History:  Procedure Laterality Date   ABDOMINAL HYSTERECTOMY     APPENDECTOMY     BREAST SURGERY     DENTAL SURGERY     TONSILLECTOMY AND ADENOIDECTOMY     Patient Active Problem List   Diagnosis Date Noted   Benign paroxysmal positional vertigo due to bilateral vestibular disorder 04/30/2023   Peripheral vertigo involving left ear 04/21/2023   CKD (chronic kidney disease), stage III (HCC) 04/13/2016   PAD (peripheral artery disease) (HCC) 04/12/2016   Anxiety disorder 11/07/2015   Carotid bruit 07/06/2013   Essential hypertension 02/04/2012   Dyslipidemia 02/04/2012   Menopausal syndrome on hormone replacement therapy 02/04/2012    PCP: Tressa Busman, MD REFERRING PROVIDER: Lorin Glass, MD  REFERRING DIAG: H81.12 (ICD-10-CM) - Benign paroxysmal positional vertigo of left ear  THERAPY DIAG:  Dizziness and giddiness  Cervicalgia  ONSET DATE: 7 days ago  Rationale for Evaluation and Treatment: Rehabilitation  SUBJECTIVE:   SUBJECTIVE STATEMENT: Reports that she read over the DN info and requests using it only as a last resort d/t fear of nausea. Requesting more info on TENS. Report that she is still sore from massage. Reports compliance with HEP and  reports benefit from them as well as from massage.   Pt accompanied by: significant other  PERTINENT HISTORY: PMH significant for HTN, HLD, anxiety/depression. Patient presented to the ED at [facility] yesterday 1/20 with complaint of dizziness with nausea upon getting up in the morning.  She has been experiencing it for the last 5 days every morning.  She feels the room spins around her for a short time.  The day prior to presentation, it happened when she was having a photo as he had to sit down.  PAIN:  Are you having pain? Yes: NPRS scale: 4/10 Pain location: L neck, upper back, shoulders Pain description: sharp Aggravating factors: neck rotation, reaching Relieving factors: slow movements, avoidance  PRECAUTIONS: None  RED FLAGS: None   WEIGHT BEARING RESTRICTIONS: No  FALLS: Has patient fallen in last 6 months? No  LIVING ENVIRONMENT: Lives with: lives with their spouse Lives in: House/apartment, 2nd floor BR Stairs: exterior/interior w/ HR Has following equipment at home: None, has a shower chair and walker at her disposal  PLOF: Independent, walks for exercise, yard work  PATIENT GOALS: eliminate symptoms  OBJECTIVE:     TODAY'S TREATMENT: 05/16/23 Activity Comments  shoulder shrugs 10x3" Focus on scapular depression  shoulder circles 10x fwd/back Cues for larger excursion   Supine cervical retraction into towel roll  Cueing for retraction rather than tilting of the head  Supine DNF lift offs 5x5" 2 sets  Cueing to continue breathing normally; this appeared challenging   Edu and handout on TENS use, precautions, safety Pt reported understanding  Trial of TENs to L UT TENS 700; 8 min; amplitude to tolerance; pt tolerated well and reported relief     PATIENT EDUCATION: Education details: Edu and handout on TENS use, precautions, safety; edu on habituating dizziness in safe manor Person educated: Patient Education method: Explanation, Demonstration, Tactile  cues, Verbal cues, and Handouts Education comprehension: verbalized understanding and returned demonstration    HOME EXERCISE PROGRAM:  Access Code: [email or web address] URL: [email or web address] Date: 05/05/2023 Prepared by: [facility] - Outpatient  Rehab - [facility]  Exercises - Cervical Extension AROM with Strap  - 1 x daily - 7 x weekly - 3 sets - 10 reps - Mid-Lower Cervical Extension SNAG with Strap  - 1 x daily - 7 x weekly - 3 sets - 10 reps - Standing Lower Cervical and Upper Thoracic Stretch  - 1 x daily - 7 x weekly - 3 sets - 10 reps - Supine Isometric Neck Extension  - 1 x daily - 7 x weekly - 1-3 sets - 10 reps - 2 sec hold - Seated Assisted Cervical Rotation with Towel  - 1-2 x daily - 7 x weekly - 1 sets - 3-5 reps - 15-30 sec hold   -------------------------------------------------------------- Note: Objective measures were completed at Evaluation unless otherwise noted.  DIAGNOSTIC FINDINGS:   COGNITION: Overall cognitive status: Within functional limits for tasks assessed   SENSATION: WFL  EDEMA:    PALPATION:  tenderness and trigger points appreciated along medial scapular border R > L   DTRs:  NT  POSTURE:  No Significant postural limitations  Cervical ROM:    Active A/PROM (deg) eval  Flexion 60  Extension 20  Right lateral flexion 15  Left lateral flexion 15  Right rotation 25  Left rotation 25  (Blank rows = not tested)  STRENGTH:   LOWER EXTREMITY MMT:   MMT Right eval Left eval  Hip flexion    Hip abduction    Hip adduction    Hip internal rotation    Hip external rotation    Knee flexion    Knee extension    Ankle dorsiflexion    Ankle plantarflexion    Ankle inversion    Ankle eversion    (Blank rows = not tested)  BED MOBILITY:  indep  TRANSFERS: Indep    GAIT: Gait pattern:  decreased speed due to guarding from symptoms Distance walked:  Assistive device utilized: None Level of assistance:  Complete Independence Comments:     VESTIBULAR ASSESSMENT:  GENERAL OBSERVATION: wears progressive lens glasses   SYMPTOM BEHAVIOR:  Subjective history: 7 day hx of positional vertigo  Non-Vestibular symptoms: neck pain  Type of dizziness: Spinning/Vertigo  Frequency: daily  Duration: seconds-minutes   Aggravating factors: Induced by position change: lying supine, rolling to the left, and supine to sit  Relieving factors: slow movements  Progression of symptoms: better  OCULOMOTOR EXAM:  Ocular Alignment: normal  Ocular ROM: No Limitations  Spontaneous Nystagmus: absent  Gaze-Induced Nystagmus: absent  Smooth Pursuits: intact  Saccades: slow  Convergence/Divergence:  cm    VESTIBULAR - OCULAR REFLEX:   Slow VOR: Comment: limited ROM due to neck pain  VOR Cancellation: Comment: slow, notes some provocation of symptoms  Head-Impulse Test: unable to perform due to neck pain  Dynamic Visual Acuity: Not able to be assessed  POSITIONAL TESTING: Right Dix-Hallpike: no nystagmus Left Dix-Hallpike: no nystagmus Right Roll Test: no nystagmus Left Roll Test: no nystagmus  MOTION SENSITIVITY:  Motion Sensitivity Quotient Intensity: 0 = none, 1 = Lightheaded, 2 = Mild, 3 = Moderate, 4 = Severe, 5 = Vomiting  Intensity  1. Sitting to supine   2. Supine to L side   3. Supine to R side   4. Supine to sitting   5. L Hallpike-Dix   6. Up from L    7. R Hallpike-Dix   8. Up from R    9. Sitting, head tipped to L knee   10. Head up from L knee   11. Sitting, head tipped to R knee   12. Head up from R knee   13. Sitting head turns x5   14.Sitting head nods x5   15. In stance, 180 turn to L    16. In stance, 180 turn to R     OTHOSTATICS: not done  FUNCTIONAL GAIT: Functional gait assessment: TBD M-CTSIB: TBD                                                                                                                            TREATMENT DATE: 04/29/23    PATIENT  EDUCATION: Education details: assessment details, HEP initiation Person educated: Patient and Spouse Education method: Explanation and Handouts Education comprehension: needs further education  HOME EXERCISE PROGRAM:  Access Code: [email or web address] URL: [email or web address] Date: 04/29/2023 Prepared by: Shary Decamp  Exercises - Cervical Extension AROM with Strap  - 1 x daily - 7 x weekly - 3 sets - 10 reps - Mid-Lower Cervical Extension SNAG with Strap  - 1 x daily - 7 x weekly - 3 sets - 10 reps - Standing Lower Cervical and Upper Thoracic Stretch  - 1 x daily - 7 x weekly - 3 sets - 10 reps - Supine Isometric Neck Extension  - 1 x daily - 7 x weekly - 1-3 sets - 10 reps - 2 sec hold  GOALS: Goals reviewed with patient? Yes  SHORT TERM GOALS: Target date: same as LTG    LONG TERM GOALS: Target date: 05/27/2023    Patient will be independent in HEP to improve functional outcomes Baseline:  Goal status: IN PROGRESS  2.  Demo improved cervical spine ROM to 35 degrees rotation to improve comfort/safety when driving Baseline: 25 degrees R/L Goal status: IN PROGRESS  3.  Demo score of 25/30 Functional Gait Assessment to manifest improved balance and reduced risk for falls Baseline: TBD Goal status: IN PROGRESS  4.  Pt to report neck pain not exceeding 2/10 during daily activities to improve comfort and activity tolerance Baseline: 7-8/10 with sharp pains with certain movements/activities Goal status: IN PROGRESS    ASSESSMENT:  CLINICAL IMPRESSION: Patient arrived to session with request for more information on TENS and requests to hold off on DN for now. Proceeded with gentle cervical and shoulder strengthening  and ROM. Patient requires cueing for scapular depression and reducing muscle guarding. Educated on TENs unit and trialed it today on L UT. Patient reported good relief and tolerance. no complaints at end of session.   OBJECTIVE IMPAIRMENTS: Abnormal gait,  decreased activity tolerance, decreased balance, decreased ROM, dizziness, hypomobility, increased muscle spasms, and pain.   ACTIVITY LIMITATIONS: carrying, lifting, bending, reach over head, and locomotion level  PARTICIPATION LIMITATIONS: meal prep, cleaning, laundry, driving, shopping, community activity, yard work, and exercise routine  PERSONAL FACTORS: Age, Time since onset of injury/illness/exacerbation, and 1 comorbidity: PMH  are also affecting patient's functional outcome.   REHAB POTENTIAL: Excellent  CLINICAL DECISION MAKING: Evolving/moderate complexity  EVALUATION COMPLEXITY: Moderate   PLAN:  PT FREQUENCY: 1-2x/week  PT DURATION: 4 weeks  PLANNED INTERVENTIONS: 97164- PT Re-evaluation, 97110-Therapeutic exercises, 97530- Therapeutic activity, [id number]- Neuromuscular re-education, 97535- Self Care, 64332- Manual therapy, [id number]- Gait training, [phone]- Canalith repositioning, [id number]- Aquatic Therapy, [phone]- Electrical stimulation (manual), Patient/Family education, Balance training, Taping, Dry Needling, Joint mobilization, Spinal mobilization, Vestibular training, DME instructions, Cryotherapy, and Moist heat  PLAN FOR NEXT SESSION: manual therapy to neck, treat positional dizziness as indicated; balance on compliant surfaces.   Baldemar Friday, PT, DPT 05/16/23 11:53 AM   Outpatient Rehab at [facility] [address] [place], [address] [place], [place] [address] Phone # [phone] Fax # [phone]

## 2023-05-15 NOTE — Therapy (Signed)
OUTPATIENT PHYSICAL THERAPY VESTIBULAR TREATMENT     Patient Name: Stephanie Aguilar MRN: [id number] DOB:1941/03/09, 83 y.o., female Today's Date: 05/19/2023  END OF SESSION:  PT End of Session - 05/19/23 1012     Visit Number 6    Number of Visits 8    Date for PT Re-Evaluation 05/27/23    Authorization Type [employer]    PT Start Time 0932    PT Stop Time 1015    PT Time Calculation (min) 43 min    Activity Tolerance Patient tolerated treatment well    Behavior During Therapy WFL for tasks assessed/performed                Past Medical History:  Diagnosis Date   Allergy    Anxiety    Cataract    Depression    Hyperlipidemia    Hypertension    Past Surgical History:  Procedure Laterality Date   ABDOMINAL HYSTERECTOMY     APPENDECTOMY     BREAST SURGERY     DENTAL SURGERY     TONSILLECTOMY AND ADENOIDECTOMY     Patient Active Problem List   Diagnosis Date Noted   Benign paroxysmal positional vertigo due to bilateral vestibular disorder 04/30/2023   Peripheral vertigo involving left ear 04/21/2023   CKD (chronic kidney disease), stage III (HCC) 04/13/2016   PAD (peripheral artery disease) (HCC) 04/12/2016   Anxiety disorder 11/07/2015   Carotid bruit 07/06/2013   Essential hypertension 02/04/2012   Dyslipidemia 02/04/2012   Menopausal syndrome on hormone replacement therapy 02/04/2012    PCP: Tressa Busman, MD REFERRING PROVIDER: Lorin Glass, MD  REFERRING DIAG: H81.12 (ICD-10-CM) - Benign paroxysmal positional vertigo of left ear  THERAPY DIAG:  Dizziness and giddiness  Cervicalgia  ONSET DATE: 7 days ago  Rationale for Evaluation and Treatment: Rehabilitation  SUBJECTIVE:   SUBJECTIVE STATEMENT: "I'm fine except for the stupid muscle in my neck." Reports that as the day goes on, it gets worse. Brought in her TENS unit for instruction.   Pt accompanied by: self   PERTINENT HISTORY: PMH significant for  HTN, HLD, anxiety/depression. Patient presented to the ED at [facility] yesterday 1/20 with complaint of dizziness with nausea upon getting up in the morning.  She has been experiencing it for the last 5 days every morning.  She feels the room spins around her for a short time.  The day prior to presentation, it happened when she was having a photo as he had to sit down.  PAIN:  Are you having pain? Yes: NPRS scale: 2-3/10 Pain location: L neck, upper back, shoulders Pain description: sharp Aggravating factors: neck rotation, reaching Relieving factors: slow movements, avoidance  PRECAUTIONS: None  RED FLAGS: None   WEIGHT BEARING RESTRICTIONS: No  FALLS: Has patient fallen in last 6 months? No  LIVING ENVIRONMENT: Lives with: lives with their spouse Lives in: House/apartment, 2nd floor BR Stairs: exterior/interior w/ HR Has following equipment at home: None, has a shower chair and walker at her disposal  PLOF: Independent, walks for exercise, yard work  PATIENT GOALS: eliminate symptoms  OBJECTIVE:     TODAY'S TREATMENT: 05/19/23 Activity Comments  Edu and practice using personal TENS unit to the L UT. Had to readjust the location and mode to reduce eliciting muscle contraction. Pt had most comfort using "normal" mode  Pt reported understanding  isometric cervical extension, sidebending, flexion into ball on wall 5x5"  Cueing for 50% effort; cues to avoid using body  weight   Red TB row 2x10  Verbal and manual cues to avoid shoulder elevation and muscle guarding   L UT stretch 30" Cues for "low and slow" stretch       HOME EXERCISE PROGRAM:  Access Code: [email or web address] URL: [email or web address] Date: 05/05/2023 Prepared by: [facility] - Outpatient  Rehab - [facility]  Exercises - Cervical Extension AROM with Strap  - 1 x daily - 7 x weekly - 3 sets - 10 reps - Mid-Lower Cervical Extension SNAG with Strap  - 1 x daily - 7 x weekly - 3 sets - 10 reps -  Standing Lower Cervical and Upper Thoracic Stretch  - 1 x daily - 7 x weekly - 3 sets - 10 reps - Supine Isometric Neck Extension  - 1 x daily - 7 x weekly - 1-3 sets - 10 reps - 2 sec hold - Seated Assisted Cervical Rotation with Towel  - 1-2 x daily - 7 x weekly - 1 sets - 3-5 reps - 15-30 sec hold   -------------------------------------------------------------- Note: Objective measures were completed at Evaluation unless otherwise noted.  DIAGNOSTIC FINDINGS:   COGNITION: Overall cognitive status: Within functional limits for tasks assessed   SENSATION: WFL  EDEMA:    PALPATION:  tenderness and trigger points appreciated along medial scapular border R > L   DTRs:  NT  POSTURE:  No Significant postural limitations  Cervical ROM:    Active A/PROM (deg) eval  Flexion 60  Extension 20  Right lateral flexion 15  Left lateral flexion 15  Right rotation 25  Left rotation 25  (Blank rows = not tested)  STRENGTH:   LOWER EXTREMITY MMT:   MMT Right eval Left eval  Hip flexion    Hip abduction    Hip adduction    Hip internal rotation    Hip external rotation    Knee flexion    Knee extension    Ankle dorsiflexion    Ankle plantarflexion    Ankle inversion    Ankle eversion    (Blank rows = not tested)  BED MOBILITY:  indep  TRANSFERS: Indep    GAIT: Gait pattern:  decreased speed due to guarding from symptoms Distance walked:  Assistive device utilized: None Level of assistance: Complete Independence Comments:     VESTIBULAR ASSESSMENT:  GENERAL OBSERVATION: wears progressive lens glasses   SYMPTOM BEHAVIOR:  Subjective history: 7 day hx of positional vertigo  Non-Vestibular symptoms: neck pain  Type of dizziness: Spinning/Vertigo  Frequency: daily  Duration: seconds-minutes   Aggravating factors: Induced by position change: lying supine, rolling to the left, and supine to sit  Relieving factors: slow movements  Progression of  symptoms: better  OCULOMOTOR EXAM:  Ocular Alignment: normal  Ocular ROM: No Limitations  Spontaneous Nystagmus: absent  Gaze-Induced Nystagmus: absent  Smooth Pursuits: intact  Saccades: slow  Convergence/Divergence:  cm    VESTIBULAR - OCULAR REFLEX:   Slow VOR: Comment: limited ROM due to neck pain  VOR Cancellation: Comment: slow, notes some provocation of symptoms  Head-Impulse Test: unable to perform due to neck pain  Dynamic Visual Acuity: Not able to be assessed   POSITIONAL TESTING: Right Dix-Hallpike: no nystagmus Left Dix-Hallpike: no nystagmus Right Roll Test: no nystagmus Left Roll Test: no nystagmus  MOTION SENSITIVITY:  Motion Sensitivity Quotient Intensity: 0 = none, 1 = Lightheaded, 2 = Mild, 3 = Moderate, 4 = Severe, 5 = Vomiting  Intensity  1. Sitting to supine   2.  Supine to L side   3. Supine to R side   4. Supine to sitting   5. L Hallpike-Dix   6. Up from L    7. R Hallpike-Dix   8. Up from R    9. Sitting, head tipped to L knee   10. Head up from L knee   11. Sitting, head tipped to R knee   12. Head up from R knee   13. Sitting head turns x5   14.Sitting head nods x5   15. In stance, 180 turn to L    16. In stance, 180 turn to R     OTHOSTATICS: not done  FUNCTIONAL GAIT: Functional gait assessment: TBD M-CTSIB: TBD                                                                                                                            TREATMENT DATE: 04/29/23    PATIENT EDUCATION: Education details: assessment details, HEP initiation Person educated: Patient and Spouse Education method: Explanation and Handouts Education comprehension: needs further education  HOME EXERCISE PROGRAM:  Access Code: [email or web address] URL: [email or web address] Date: 04/29/2023 Prepared by: Shary Decamp  Exercises - Cervical Extension AROM with Strap  - 1 x daily - 7 x weekly - 3 sets - 10 reps - Mid-Lower Cervical Extension SNAG with  Strap  - 1 x daily - 7 x weekly - 3 sets - 10 reps - Standing Lower Cervical and Upper Thoracic Stretch  - 1 x daily - 7 x weekly - 3 sets - 10 reps - Supine Isometric Neck Extension  - 1 x daily - 7 x weekly - 1-3 sets - 10 reps - 2 sec hold  GOALS: Goals reviewed with patient? Yes  SHORT TERM GOALS: Target date: same as LTG    LONG TERM GOALS: Target date: 05/27/2023    Patient will be independent in HEP to improve functional outcomes Baseline:  Goal status: IN PROGRESS  2.  Demo improved cervical spine ROM to 35 degrees rotation to improve comfort/safety when driving Baseline: 25 degrees R/L Goal status: IN PROGRESS  3.  Demo score of 25/30 Functional Gait Assessment to manifest improved balance and reduced risk for falls Baseline: TBD Goal status: IN PROGRESS  4.  Pt to report neck pain not exceeding 2/10 during daily activities to improve comfort and activity tolerance Baseline: 7-8/10 with sharp pains with certain movements/activities Goal status: IN PROGRESS    ASSESSMENT:  CLINICAL IMPRESSION: Patient arrived to session with personal TENS unit in hand, requesting instruction. Patient was thoroughly instructed and again trialed application to L UT, adjusting settings to max comfort. Patient reported good tolerance and understanding. Proceeded with postural strengthening  with cueing for proper positioning and avoiding muscle guarding. Patient tolerated session well and without complaints upon leaving.   OBJECTIVE IMPAIRMENTS: Abnormal gait, decreased activity tolerance, decreased balance, decreased ROM, dizziness, hypomobility, increased muscle spasms, and pain.   ACTIVITY LIMITATIONS: carrying, lifting,  bending, reach over head, and locomotion level  PARTICIPATION LIMITATIONS: meal prep, cleaning, laundry, driving, shopping, community activity, yard work, and exercise routine  PERSONAL FACTORS: Age, Time since onset of injury/illness/exacerbation, and 1 comorbidity:  PMH  are also affecting patient's functional outcome.   REHAB POTENTIAL: Excellent  CLINICAL DECISION MAKING: Evolving/moderate complexity  EVALUATION COMPLEXITY: Moderate   PLAN:  PT FREQUENCY: 1-2x/week  PT DURATION: 4 weeks  PLANNED INTERVENTIONS: 97164- PT Re-evaluation, 97110-Therapeutic exercises, 97530- Therapeutic activity, [id number]- Neuromuscular re-education, 97535- Self Care, [id number]- Manual therapy, [id number]- Gait training, [id number]- Canalith repositioning, [id number]- Aquatic Therapy, [id number]- Electrical stimulation (manual), Patient/Family education, Balance training, Taping, Dry Needling, Joint mobilization, Spinal mobilization, Vestibular training, DME instructions, Cryotherapy, and Moist heat  PLAN FOR NEXT SESSION:  manual therapy to neck, treat positional dizziness as indicated; balance on compliant surfaces.   Baldemar Friday, PT, DPT 05/19/23 10:17 AM  [place] at [facility] [place] [address] [place], [address] [place], [place] [address] Phone # [phone] Fax # [phone]

## 2023-05-16 ENCOUNTER — Encounter: Payer: Self-pay | Admitting: Physical Therapy

## 2023-05-16 ENCOUNTER — Ambulatory Visit: Payer: Medicare Other | Admitting: Physical Therapy

## 2023-05-16 DIAGNOSIS — M542 Cervicalgia: Secondary | ICD-10-CM

## 2023-05-16 DIAGNOSIS — R42 Dizziness and giddiness: Secondary | ICD-10-CM | POA: Diagnosis not present

## 2023-05-16 NOTE — Patient Instructions (Signed)
TENS stands for Transcutaneous Electrical Nerve Stimulation. In other words, electrical impulses are allowed to pass through the skin in order to excite a nerve.   Purpose and Use of TENS:  TENS is a method used to manage acute and chronic pain without the use of drugs. It has been effective in managing pain associated with surgery, sprains, strains, trauma, rheumatoid arthritis, and neuralgias. It is a non-addictive, low risk, and non-invasive technique used to control pain. It is not, by any means, a curative form of treatment.   How TENS Works:  Most TENS units are a [employer]istician unit powered by one 9 volt battery. Attached to the outside of the unit are two lead wires where two pins and/or snaps connect on each wire. All units come with a set of four reusable pads or electrodes. These are placed on the skin surrounding the area involved. By inserting the leads into  the pads, the electricity can pass from the unit making the circuit complete.  As the intensity is turned up slowly, the electrical current enters the body from the electrodes through the skin to the surrounding nerve fibers. This triggers the release of hormones from within the body. These hormones contain pain relievers. By increasing the circulation of these hormones, the person's pain may be lessened. It is also believed that the electrical stimulation itself helps to block the pain messages being sent to the brain, thus also decreasing the body's perception of pain.   Hazards:  TENS units are NOT to be used by patients with PACEMAKERS, DEFIBRILLATORS, DIABETIC PUMPS, PREGNANT WOMEN, and patients with SEIZURE DISORDERS.  TENS units are NOT to be used over the heart, throat, brain, or spinal cord.  One of the major side effects from the TENS unit may be skin irritation. Some people may develop a rash if they are sensitive to the materials used in the electrodes or the connecting wires.   Wear the unit for 15 min.   Avoid  overuse due the body getting used to the stem making it not as effective over time.     TENS UNIT  This is helpful for muscle pain and spasm.   Search and Purchase a TENS 7000 2nd edition at www.tenspros.com or www.amazon.com  (It should be less than $30)     TENS unit instructions:   Do not shower or bathe with the unit on  Turn the unit off before removing electrodes or batteries  If the electrodes lose stickiness add a drop of water to the electrodes after they are disconnected from the unit and place on plastic sheet. If you continued to have difficulty, call the TENS unit company to purchase more electrodes.  Do not apply lotion on the skin area prior to use. Make sure the skin is clean and dry as this will help prolong the life of the electrodes.  After use, always check skin for unusual red areas, rash or other skin difficulties. If there are any skin problems, does not apply electrodes to the same area.  Never remove the electrodes from the unit by pulling the wires.  Do not use the TENS unit or electrodes other than as directed.  Do not change electrode placement without consulting your therapist or physician.  Keep 2 fingers with between each electrode.

## 2023-05-19 ENCOUNTER — Ambulatory Visit: Payer: Medicare Other | Admitting: Physical Therapy

## 2023-05-19 ENCOUNTER — Encounter: Payer: Self-pay | Admitting: Physical Therapy

## 2023-05-19 DIAGNOSIS — R42 Dizziness and giddiness: Secondary | ICD-10-CM

## 2023-05-19 DIAGNOSIS — M542 Cervicalgia: Secondary | ICD-10-CM

## 2023-05-22 NOTE — Therapy (Signed)
 OUTPATIENT PHYSICAL THERAPY VESTIBULAR TREATMENT     Patient Name: Stephanie Aguilar MRN: [id number] DOB:26-Jan-1941, 83 y.o., female Today's Date: 05/26/2023  END OF SESSION:  PT End of Session - 05/26/23 1015     Visit Number 8    Number of Visits 8    Date for PT Re-Evaluation 05/27/23    Authorization Type [employer]    PT Start Time [phone]    PT Stop Time 1013    PT Time Calculation (min) 40 min    Equipment Utilized During Treatment Gait belt    Activity Tolerance Patient tolerated treatment well    Behavior During Therapy WFL for tasks assessed/performed                 Past Medical History:  Diagnosis Date   Allergy    Anxiety    Cataract    Depression    Hyperlipidemia    Hypertension    Past Surgical History:  Procedure Laterality Date   ABDOMINAL HYSTERECTOMY     APPENDECTOMY     BREAST SURGERY     DENTAL SURGERY     TONSILLECTOMY AND ADENOIDECTOMY     Patient Active Problem List   Diagnosis Date Noted   Benign paroxysmal positional vertigo due to bilateral vestibular disorder 04/30/2023   Peripheral vertigo involving left ear 04/21/2023   CKD (chronic kidney disease), stage III (HCC) 04/13/2016   PAD (peripheral artery disease) (HCC) 04/12/2016   Anxiety disorder 11/07/2015   Carotid bruit 07/06/2013   Essential hypertension 02/04/2012   Dyslipidemia 02/04/2012   Menopausal syndrome on hormone replacement therapy 02/04/2012    PCP: Tressa Busman, MD REFERRING PROVIDER: Lorin Glass, MD  REFERRING DIAG: H81.12 (ICD-10-CM) - Benign paroxysmal positional vertigo of left ear  THERAPY DIAG:  Dizziness and giddiness  Cervicalgia  ONSET DATE: 7 days ago  Rationale for Evaluation and Treatment: Rehabilitation  SUBJECTIVE:   SUBJECTIVE STATEMENT: "Had a bumbly day yesterday but I'm okay today." Reports that she felt off-kilter after doing her HEP and took a Meclizine.    Pt accompanied by: self    PERTINENT HISTORY: PMH significant for HTN, HLD, anxiety/depression. Patient presented to the ED at [facility] yesterday 1/20 with complaint of dizziness with nausea upon getting up in the morning.  She has been experiencing it for the last 5 days every morning.  She feels the room spins around her for a short time.  The day prior to presentation, it happened when she was having a photo as he had to sit down.  PAIN:  Are you having pain? Yes: NPRS scale: 0/10 Pain location: L neck, upper back, shoulders Pain description: sharp Aggravating factors: neck rotation, reaching Relieving factors: slow movements, avoidance  PRECAUTIONS: None  RED FLAGS: None   WEIGHT BEARING RESTRICTIONS: No  FALLS: Has patient fallen in last 6 months? No  LIVING ENVIRONMENT: Lives with: lives with their spouse Lives in: House/apartment, 2nd floor BR Stairs: exterior/interior w/ HR Has following equipment at home: None, has a shower chair and walker at her disposal  PLOF: Independent, walks for exercise, yard work  PATIENT GOALS: eliminate symptoms  OBJECTIVE:     TODAY'S TREATMENT: 05/26/23 Activity Comments  FGA 26/30  R brandt daroff x2 C/o 4/10 "bumbly" upon sitting up and lying down   R DH R upbeating torsional nystagmus lasting 40 sec  R Epley Tolerated well             Cervical ROM:  Active A/PROM (deg) eval 2.24.25  Flexion 60 21  Extension 20 33   Right lateral flexion 15 30  Left lateral flexion 15 22  Right rotation 25 44 *tight  Left rotation 25 49 *tight   (Blank rows = not tested)    PATIENT EDUCATION: Education details: edu on progress towards goals and remaining impairments; review of HEP update, 30 day hold, edu on post-CRM expectations  Person educated: Patient Education method: Medical illustrator Education comprehension: verbalized understanding and returned demonstration    HOME EXERCISE PROGRAM: Access Code: [email or web address] URL:  [email or web address] Date: 05/26/2023 Prepared by: [facility] - Outpatient  Rehab - [facility]  Exercises - Cervical Extension AROM with Strap  - 1 x daily - 7 x weekly - 3 sets - 10 reps - Mid-Lower Cervical Extension SNAG with Strap  - 1 x daily - 7 x weekly - 3 sets - 10 reps - Standing Lower Cervical and Upper Thoracic Stretch  - 1 x daily - 7 x weekly - 3 sets - 10 reps - Supine Isometric Neck Extension  - 1 x daily - 7 x weekly - 1-3 sets - 10 reps - 2 sec hold - Seated Assisted Cervical Rotation with Towel  - 1-2 x daily - 7 x weekly - 1 sets - 3-5 reps - 15-30 sec hold - Brandt-Daroff Vestibular Exercise  - 1 x daily - 5 x weekly - 2 sets - 3-5 reps   -------------------------------------------------------------- Note: Objective measures were completed at Evaluation unless otherwise noted.  DIAGNOSTIC FINDINGS:   COGNITION: Overall cognitive status: Within functional limits for tasks assessed   SENSATION: WFL  EDEMA:    PALPATION:  tenderness and trigger points appreciated along medial scapular border R > L   DTRs:  NT  POSTURE:  No Significant postural limitations  Cervical ROM:    Active A/PROM (deg) eval  Flexion 60  Extension 20  Right lateral flexion 15  Left lateral flexion 15  Right rotation 25  Left rotation 25  (Blank rows = not tested)  STRENGTH:   LOWER EXTREMITY MMT:   MMT Right eval Left eval  Hip flexion    Hip abduction    Hip adduction    Hip internal rotation    Hip external rotation    Knee flexion    Knee extension    Ankle dorsiflexion    Ankle plantarflexion    Ankle inversion    Ankle eversion    (Blank rows = not tested)  BED MOBILITY:  indep  TRANSFERS: Indep    GAIT: Gait pattern:  decreased speed due to guarding from symptoms Distance walked:  Assistive device utilized: None Level of assistance: Complete Independence Comments:     VESTIBULAR ASSESSMENT:  GENERAL OBSERVATION:  wears progressive lens glasses   SYMPTOM BEHAVIOR:  Subjective history: 7 day hx of positional vertigo  Non-Vestibular symptoms: neck pain  Type of dizziness: Spinning/Vertigo  Frequency: daily  Duration: seconds-minutes   Aggravating factors: Induced by position change: lying supine, rolling to the left, and supine to sit  Relieving factors: slow movements  Progression of symptoms: better  OCULOMOTOR EXAM:  Ocular Alignment: normal  Ocular ROM: No Limitations  Spontaneous Nystagmus: absent  Gaze-Induced Nystagmus: absent  Smooth Pursuits: intact  Saccades: slow  Convergence/Divergence:  cm    VESTIBULAR - OCULAR REFLEX:   Slow VOR: Comment: limited ROM due to neck pain  VOR Cancellation: Comment: slow, notes some provocation of symptoms  Head-Impulse Test: unable to perform  due to neck pain  Dynamic Visual Acuity: Not able to be assessed   POSITIONAL TESTING: Right Dix-Hallpike: no nystagmus Left Dix-Hallpike: no nystagmus Right Roll Test: no nystagmus Left Roll Test: no nystagmus  MOTION SENSITIVITY:  Motion Sensitivity Quotient Intensity: 0 = none, 1 = Lightheaded, 2 = Mild, 3 = Moderate, 4 = Severe, 5 = Vomiting  Intensity  1. Sitting to supine   2. Supine to L side   3. Supine to R side   4. Supine to sitting   5. L Hallpike-Dix   6. Up from L    7. R Hallpike-Dix   8. Up from R    9. Sitting, head tipped to L knee   10. Head up from L knee   11. Sitting, head tipped to R knee   12. Head up from R knee   13. Sitting head turns x5   14.Sitting head nods x5   15. In stance, 180 turn to L    16. In stance, 180 turn to R     OTHOSTATICS: not done  FUNCTIONAL GAIT: Functional gait assessment: TBD M-CTSIB: TBD                                                                                                                            TREATMENT DATE: 04/29/23    PATIENT EDUCATION: Education details: assessment details, HEP initiation Person educated:  Patient and Spouse Education method: Explanation and Handouts Education comprehension: needs further education  HOME EXERCISE PROGRAM:  Access Code: [email or web address] URL: [email or web address] Date: 04/29/2023 Prepared by: Shary Decamp  Exercises - Cervical Extension AROM with Strap  - 1 x daily - 7 x weekly - 3 sets - 10 reps - Mid-Lower Cervical Extension SNAG with Strap  - 1 x daily - 7 x weekly - 3 sets - 10 reps - Standing Lower Cervical and Upper Thoracic Stretch  - 1 x daily - 7 x weekly - 3 sets - 10 reps - Supine Isometric Neck Extension  - 1 x daily - 7 x weekly - 1-3 sets - 10 reps - 2 sec hold  GOALS: Goals reviewed with patient? Yes  SHORT TERM GOALS: Target date: same as LTG    LONG TERM GOALS: Target date: 05/27/2023    Patient will be independent in HEP to improve functional outcomes Baseline:  Goal status: MET 05/26/23  2.  Demo improved cervical spine ROM to 35 degrees rotation to improve comfort/safety when driving Baseline: 25 degrees R/L; improved 05/26/23 Goal status: MET 05/26/23  3.  Demo score of 25/30 Functional Gait Assessment to manifest improved balance and reduced risk for falls Baseline: 26/30 05/26/23 Goal status: MET 05/26/23  4.  Pt to report neck pain not exceeding 2/10 during daily activities to improve comfort and activity tolerance Baseline: 7-8/10 with sharp pains with certain movements/activities; reports 1-2/10 on average day to day 05/26/23 Goal status: MET 05/26/23    ASSESSMENT:  CLINICAL IMPRESSION: Patient  arrived to session with report of taking Meclizine after performing her habituation exercises. Cervical AROM revealed improvement in extension, B sidebending, and rotation.  She reports 1-2/10 on average with day to day activities at this time. Patient scored 26/30 on FGA, indicating a decreased risk of falls. HEP was reviewed, which revealed mild dizziness. R DH was positive, and treated with R Epley with good tolerance.  Patient agreeable to 30 day hold to allow for observation period of her dizziness and neck pain. No complaints at end of session.   OBJECTIVE IMPAIRMENTS: Abnormal gait, decreased activity tolerance, decreased balance, decreased ROM, dizziness, hypomobility, increased muscle spasms, and pain.   ACTIVITY LIMITATIONS: carrying, lifting, bending, reach over head, and locomotion level  PARTICIPATION LIMITATIONS: meal prep, cleaning, laundry, driving, shopping, community activity, yard work, and exercise routine  PERSONAL FACTORS: Age, Time since onset of injury/illness/exacerbation, and 1 comorbidity: PMH  are also affecting patient's functional outcome.   REHAB POTENTIAL: Excellent  CLINICAL DECISION MAKING: Evolving/moderate complexity  EVALUATION COMPLEXITY: Moderate   PLAN:  PT FREQUENCY: 1-2x/week  PT DURATION: 4 weeks  PLANNED INTERVENTIONS: 97164- PT Re-evaluation, 97110-Therapeutic exercises, 97530- Therapeutic activity, 97112- Neuromuscular re-education, 97535- Self Care, 91478- Manual therapy, [phone]- Gait training, [phone]- Canalith repositioning, [id number]- Aquatic Therapy, [phone]- Electrical stimulation (manual), Patient/Family education, Balance training, Taping, Dry Needling, Joint mobilization, Spinal mobilization, Vestibular training, DME instructions, Cryotherapy, and Moist heat  PLAN FOR NEXT SESSION:  30 dya hold at this time   Baldemar Friday, PT, DPT 05/26/23 10:17 AM  [facility] at [facility] [address] [place], [place] [address] Phone # [phone] Fax # [phone]

## 2023-05-22 NOTE — Therapy (Signed)
OUTPATIENT PHYSICAL THERAPY VESTIBULAR TREATMENT     Patient Name: Stephanie Aguilar MRN: [id number] DOB:08/25/1940, 83 y.o., female Today's Date: 05/23/2023  END OF SESSION:  PT End of Session - 05/23/23 1012     Visit Number 7    Number of Visits 8    Date for PT Re-Evaluation 05/27/23    Authorization Type [employer] Medicare    PT Start Time [phone]    PT Stop Time 1013    PT Time Calculation (min) 46 min    Activity Tolerance Patient tolerated treatment well    Behavior During Therapy WFL for tasks assessed/performed                 Past Medical History:  Diagnosis Date   Allergy    Anxiety    Cataract    Depression    Hyperlipidemia    Hypertension    Past Surgical History:  Procedure Laterality Date   ABDOMINAL HYSTERECTOMY     APPENDECTOMY     BREAST SURGERY     DENTAL SURGERY     TONSILLECTOMY AND ADENOIDECTOMY     Patient Active Problem List   Diagnosis Date Noted   Benign paroxysmal positional vertigo due to bilateral vestibular disorder 04/30/2023   Peripheral vertigo involving left ear 04/21/2023   CKD (chronic kidney disease), stage III (HCC) 04/13/2016   PAD (peripheral artery disease) (HCC) 04/12/2016   Anxiety disorder 11/07/2015   Carotid bruit 07/06/2013   Essential hypertension 02/04/2012   Dyslipidemia 02/04/2012   Menopausal syndrome on hormone replacement therapy 02/04/2012    PCP: Tressa Busman, MD REFERRING PROVIDER: Lorin Glass, MD  REFERRING DIAG: H81.12 (ICD-10-CM) - Benign paroxysmal positional vertigo of left ear  THERAPY DIAG:  Dizziness and giddiness  Cervicalgia  ONSET DATE: 7 days ago  Rationale for Evaluation and Treatment: Rehabilitation  SUBJECTIVE:   SUBJECTIVE STATEMENT: Could not find the picture we took of the electrode last session. Used TENS but has had trouble with muscles contracting. Reports that pain has been less lately.   Pt accompanied by: self   PERTINENT HISTORY:  PMH significant for HTN, HLD, anxiety/depression. Patient presented to the ED at [facility] yesterday 1/20 with complaint of dizziness with nausea upon getting up in the morning.  She has been experiencing it for the last 5 days every morning.  She feels the room spins around her for a short time.  The day prior to presentation, it happened when she was having a photo as he had to sit down.  PAIN:  Are you having pain? Yes: NPRS scale: 1/10 Pain location: L neck, upper back, shoulders Pain description: sharp Aggravating factors: neck rotation, reaching Relieving factors: slow movements, avoidance  PRECAUTIONS: None  RED FLAGS: None   WEIGHT BEARING RESTRICTIONS: No  FALLS: Has patient fallen in last 6 months? No  LIVING ENVIRONMENT: Lives with: lives with their spouse Lives in: House/apartment, 2nd floor BR Stairs: exterior/interior w/ HR Has following equipment at home: None, has a shower chair and walker at her disposal  PLOF: Independent, walks for exercise, yard work  PATIENT GOALS: eliminate symptoms  OBJECTIVE:     TODAY'S TREATMENT: 05/23/23 Activity Comments  Edu and practice placing electrodes over L UT and adjusting settings for comfort on pt's personal TENS unit Patient with best comfort with intensity at 3.5 and adjusted pulse width from 300>200 micro-s  R/L rolling EO and EC 1x each  No dizziness   R 4x /L 1x brandt daroff EO  Report of 3/10 dizziness upon sitting up from R side  No dizziness from L side               PATIENT EDUCATION: Education details: discussed reassessment next session, edu on basis of habituation and how it improves symptoms, HEP update and edu for safety; advised pt to discontinue neck HEP if it causes pain, answered pt's questions on calling MD for new referral if her BPPV returns Person educated: Patient Education method: Explanation, Demonstration, Tactile cues, Verbal cues, and Handouts Education comprehension: verbalized  understanding and returned demonstration    HOME EXERCISE PROGRAM: Access Code: [email or web address] URL: [email or web address] Date: 05/23/2023 Prepared by: [facility] - Outpatient  Rehab - [facility]  Exercises - Cervical Extension AROM with Strap  - 1 x daily - 7 x weekly - 3 sets - 10 reps - Mid-Lower Cervical Extension SNAG with Strap  - 1 x daily - 7 x weekly - 3 sets - 10 reps - Standing Lower Cervical and Upper Thoracic Stretch  - 1 x daily - 7 x weekly - 3 sets - 10 reps - Supine Isometric Neck Extension  - 1 x daily - 7 x weekly - 1-3 sets - 10 reps - 2 sec hold - Seated Assisted Cervical Rotation with Towel  - 1-2 x daily - 7 x weekly - 1 sets - 3-5 reps - 15-30 sec hold - Brandt-Daroff Vestibular Exercise  - 1 x daily - 5 x weekly - 2 sets - 3-5 reps   -------------------------------------------------------------- Note: Objective measures were completed at Evaluation unless otherwise noted.  DIAGNOSTIC FINDINGS:   COGNITION: Overall cognitive status: Within functional limits for tasks assessed   SENSATION: WFL  EDEMA:    PALPATION:  tenderness and trigger points appreciated along medial scapular border R > L   DTRs:  NT  POSTURE:  No Significant postural limitations  Cervical ROM:    Active A/PROM (deg) eval  Flexion 60  Extension 20  Right lateral flexion 15  Left lateral flexion 15  Right rotation 25  Left rotation 25  (Blank rows = not tested)  STRENGTH:   LOWER EXTREMITY MMT:   MMT Right eval Left eval  Hip flexion    Hip abduction    Hip adduction    Hip internal rotation    Hip external rotation    Knee flexion    Knee extension    Ankle dorsiflexion    Ankle plantarflexion    Ankle inversion    Ankle eversion    (Blank rows = not tested)  BED MOBILITY:  indep  TRANSFERS: Indep    GAIT: Gait pattern:  decreased speed due to guarding from symptoms Distance walked:  Assistive device utilized: None Level of  assistance: Complete Independence Comments:     VESTIBULAR ASSESSMENT:  GENERAL OBSERVATION: wears progressive lens glasses   SYMPTOM BEHAVIOR:  Subjective history: 7 day hx of positional vertigo  Non-Vestibular symptoms: neck pain  Type of dizziness: Spinning/Vertigo  Frequency: daily  Duration: seconds-minutes   Aggravating factors: Induced by position change: lying supine, rolling to the left, and supine to sit  Relieving factors: slow movements  Progression of symptoms: better  OCULOMOTOR EXAM:  Ocular Alignment: normal  Ocular ROM: No Limitations  Spontaneous Nystagmus: absent  Gaze-Induced Nystagmus: absent  Smooth Pursuits: intact  Saccades: slow  Convergence/Divergence:  cm    VESTIBULAR - OCULAR REFLEX:   Slow VOR: Comment: limited ROM due to neck pain  VOR Cancellation: Comment: slow, notes  some provocation of symptoms  Head-Impulse Test: unable to perform due to neck pain  Dynamic Visual Acuity: Not able to be assessed   POSITIONAL TESTING: Right Dix-Hallpike: no nystagmus Left Dix-Hallpike: no nystagmus Right Roll Test: no nystagmus Left Roll Test: no nystagmus  MOTION SENSITIVITY:  Motion Sensitivity Quotient Intensity: 0 = none, 1 = Lightheaded, 2 = Mild, 3 = Moderate, 4 = Severe, 5 = Vomiting  Intensity  1. Sitting to supine   2. Supine to L side   3. Supine to R side   4. Supine to sitting   5. L Hallpike-Dix   6. Up from L    7. R Hallpike-Dix   8. Up from R    9. Sitting, head tipped to L knee   10. Head up from L knee   11. Sitting, head tipped to R knee   12. Head up from R knee   13. Sitting head turns x5   14.Sitting head nods x5   15. In stance, 180 turn to L    16. In stance, 180 turn to R     OTHOSTATICS: not done  FUNCTIONAL GAIT: Functional gait assessment: TBD M-CTSIB: TBD                                                                                                                            TREATMENT DATE:  04/29/23    PATIENT EDUCATION: Education details: assessment details, HEP initiation Person educated: Patient and Spouse Education method: Explanation and Handouts Education comprehension: needs further education  HOME EXERCISE PROGRAM:  Access Code: [email or web address] URL: [email or web address] Date: 04/29/2023 Prepared by: Shary Decamp  Exercises - Cervical Extension AROM with Strap  - 1 x daily - 7 x weekly - 3 sets - 10 reps - Mid-Lower Cervical Extension SNAG with Strap  - 1 x daily - 7 x weekly - 3 sets - 10 reps - Standing Lower Cervical and Upper Thoracic Stretch  - 1 x daily - 7 x weekly - 3 sets - 10 reps - Supine Isometric Neck Extension  - 1 x daily - 7 x weekly - 1-3 sets - 10 reps - 2 sec hold  GOALS: Goals reviewed with patient? Yes  SHORT TERM GOALS: Target date: same as LTG    LONG TERM GOALS: Target date: 05/27/2023    Patient will be independent in HEP to improve functional outcomes Baseline:  Goal status: IN PROGRESS  2.  Demo improved cervical spine ROM to 35 degrees rotation to improve comfort/safety when driving Baseline: 25 degrees R/L Goal status: IN PROGRESS  3.  Demo score of 25/30 Functional Gait Assessment to manifest improved balance and reduced risk for falls Baseline: TBD Goal status: IN PROGRESS  4.  Pt to report neck pain not exceeding 2/10 during daily activities to improve comfort and activity tolerance Baseline: 7-8/10 with sharp pains with certain movements/activities Goal status: IN PROGRESS    ASSESSMENT:  CLINICAL IMPRESSION: Patient arrived  to session with some remaining questions on TENS unit use. Provided edu on this and adjusted settings for max comfort. Patient reported understanding. Initiated habituation with thorough education on its benefits and principles. Patient reported understanding of HEP update and without complaints upon leaving.   OBJECTIVE IMPAIRMENTS: Abnormal gait, decreased activity tolerance,  decreased balance, decreased ROM, dizziness, hypomobility, increased muscle spasms, and pain.   ACTIVITY LIMITATIONS: carrying, lifting, bending, reach over head, and locomotion level  PARTICIPATION LIMITATIONS: meal prep, cleaning, laundry, driving, shopping, community activity, yard work, and exercise routine  PERSONAL FACTORS: Age, Time since onset of injury/illness/exacerbation, and 1 comorbidity: PMH  are also affecting patient's functional outcome.   REHAB POTENTIAL: Excellent  CLINICAL DECISION MAKING: Evolving/moderate complexity  EVALUATION COMPLEXITY: Moderate   PLAN:  PT FREQUENCY: 1-2x/week  PT DURATION: 4 weeks  PLANNED INTERVENTIONS: 97164- PT Re-evaluation, 97110-Therapeutic exercises, 97530- Therapeutic activity, [id number]- Neuromuscular re-education, 97535- Self Care, 98119- Manual therapy, [id number]- Gait training, [phone]- Canalith repositioning, [id number]- Aquatic Therapy, [phone]- Electrical stimulation (manual), Patient/Family education, Balance training, Taping, Dry Needling, Joint mobilization, Spinal mobilization, Vestibular training, DME instructions, Cryotherapy, and Moist heat  PLAN FOR NEXT SESSION:  reassessment; manual therapy to neck, treat positional dizziness as indicated; balance on compliant surfaces.   Baldemar Friday, PT, DPT 05/23/23 11:11 AM  [facility] at [facility] [address] [place], [address] [place], [place] [address] Phone # [phone] Fax # [phone]

## 2023-05-23 ENCOUNTER — Ambulatory Visit: Payer: Medicare Other | Admitting: Physical Therapy

## 2023-05-23 ENCOUNTER — Encounter: Payer: Self-pay | Admitting: Physical Therapy

## 2023-05-23 DIAGNOSIS — R42 Dizziness and giddiness: Secondary | ICD-10-CM | POA: Diagnosis not present

## 2023-05-23 DIAGNOSIS — M542 Cervicalgia: Secondary | ICD-10-CM

## 2023-05-26 ENCOUNTER — Ambulatory Visit: Payer: Medicare Other | Admitting: Physical Therapy

## 2023-05-26 ENCOUNTER — Encounter: Payer: Self-pay | Admitting: Physical Therapy

## 2023-05-26 DIAGNOSIS — R42 Dizziness and giddiness: Secondary | ICD-10-CM | POA: Diagnosis not present

## 2023-05-26 DIAGNOSIS — M542 Cervicalgia: Secondary | ICD-10-CM

## 2023-06-12 NOTE — Therapy (Signed)
 OUTPATIENT PHYSICAL THERAPY VESTIBULAR RE-CERTIFICATION     Patient Name: Stephanie Aguilar MRN: [id number] DOB:05-23-1940, 83 y.o., female Today's Date: 06/13/2023  END OF SESSION:  PT End of Session - 06/13/23 1144     Visit Number 9    Number of Visits 17    Date for PT Re-Evaluation 07/11/23    Authorization Type [employer]    Authorization Time Period new auth submitted    PT Start Time 1102    PT Stop Time 1144    PT Time Calculation (min) 42 min    Activity Tolerance Patient tolerated treatment well    Behavior During Therapy WFL for tasks assessed/performed                  Past Medical History:  Diagnosis Date   Allergy    Anxiety    Cataract    Depression    Hyperlipidemia    Hypertension    Past Surgical History:  Procedure Laterality Date   ABDOMINAL HYSTERECTOMY     APPENDECTOMY     BREAST SURGERY     DENTAL SURGERY     TONSILLECTOMY AND ADENOIDECTOMY     Patient Active Problem List   Diagnosis Date Noted   Benign paroxysmal positional vertigo due to bilateral vestibular disorder 04/30/2023   Peripheral vertigo involving left ear 04/21/2023   CKD (chronic kidney disease), stage III (HCC) 04/13/2016   PAD (peripheral artery disease) (HCC) 04/12/2016   Anxiety disorder 11/07/2015   Carotid bruit 07/06/2013   Essential hypertension 02/04/2012   Dyslipidemia 02/04/2012   Menopausal syndrome on hormone replacement therapy 02/04/2012    PCP: Tressa Busman, MD REFERRING PROVIDER: Lorin Glass, MD  REFERRING DIAG: H81.12 (ICD-10-CM) - Benign paroxysmal positional vertigo of left ear  THERAPY DIAG:  Dizziness and giddiness  Cervicalgia  ONSET DATE: 7 days ago  Rationale for Evaluation and Treatment: Rehabilitation  SUBJECTIVE:   SUBJECTIVE STATEMENT: "I just can't get out of bed without getting bumbly." Reports that she also has neck pain when walking up a hill, lifting. TENS helps some.  Reports  occasionally getting diplopia at night when she is tired.     Pt accompanied by: self   PERTINENT HISTORY: PMH significant for HTN, HLD, anxiety/depression. Patient presented to the ED at [facility] yesterday 1/20 with complaint of dizziness with nausea upon getting up in the morning.  She has been experiencing it for the last 5 days every morning.  She feels the room spins around her for a short time.  The day prior to presentation, it happened when she was having a photo as he had to sit down.  PAIN:  Are you having pain? Yes: NPRS scale: 0/10 Pain location: L neck, upper back, shoulders Pain description: sharp Aggravating factors: neck rotation, reaching Relieving factors: slow movements, avoidance  PRECAUTIONS: None  RED FLAGS: None   WEIGHT BEARING RESTRICTIONS: No  FALLS: Has patient fallen in last 6 months? No  LIVING ENVIRONMENT: Lives with: lives with their spouse Lives in: House/apartment, 2nd floor BR Stairs: exterior/interior w/ HR Has following equipment at home: None, has a shower chair and walker at her disposal  PLOF: Independent, walks for exercise, yard work  PATIENT GOALS: eliminate symptoms  OBJECTIVE:     TODAY'S TREATMENT: 06/13/23 Activity Comments  L roll test  negative  R roll test Subtle L upbeating torsional nystagmus ~30 sec; pt asymptomatic   L DH Negative   R DH L upbeating torsional nystagmus lasting ~  60 sec  R epley Tolerated well; c/o mild dizziness in position 3  R DH Subtle L upbeating torsional nystagmus persisting; pt reports diplopia   R brandt daroff  Required correction of technique as patient describes some confusion on this         PATIENT EDUCATION: Education details: readministered brandt daroff , answered pt's questions, reiterated principles of habituation and intended level of sx with HEP Person educated: Patient Education method: Medical illustrator, handout Education comprehension: verbalized  understanding and returned demonstration    HOME EXERCISE PROGRAM: Access Code: [email or web address] URL: [email or web address] Date: 06/13/2023 Prepared by: [facility] - Outpatient  Rehab - [facility]  Exercises - Cervical Extension AROM with Strap  - 1 x daily - 7 x weekly - 3 sets - 10 reps - Mid-Lower Cervical Extension SNAG with Strap  - 1 x daily - 7 x weekly - 3 sets - 10 reps - Standing Lower Cervical and Upper Thoracic Stretch  - 1 x daily - 7 x weekly - 3 sets - 10 reps - Supine Isometric Neck Extension  - 1 x daily - 7 x weekly - 1-3 sets - 10 reps - 2 sec hold - Seated Assisted Cervical Rotation with Towel  - 1-2 x daily - 7 x weekly - 1 sets - 3-5 reps - 15-30 sec hold - Brandt-Daroff Vestibular Exercise (Mirrored)  - 1 x daily - 5 x weekly - 2 sets - 3-5 reps   -------------------------------------------------------------- Note: Objective measures were completed at Evaluation unless otherwise noted.  DIAGNOSTIC FINDINGS:   COGNITION: Overall cognitive status: Within functional limits for tasks assessed   SENSATION: WFL  EDEMA:    PALPATION:  tenderness and trigger points appreciated along medial scapular border R > L   DTRs:  NT  POSTURE:  No Significant postural limitations  Cervical ROM:    Active A/PROM (deg) eval  Flexion 60  Extension 20  Right lateral flexion 15  Left lateral flexion 15  Right rotation 25  Left rotation 25  (Blank rows = not tested)  STRENGTH:   LOWER EXTREMITY MMT:   MMT Right eval Left eval  Hip flexion    Hip abduction    Hip adduction    Hip internal rotation    Hip external rotation    Knee flexion    Knee extension    Ankle dorsiflexion    Ankle plantarflexion    Ankle inversion    Ankle eversion    (Blank rows = not tested)  BED MOBILITY:  indep  TRANSFERS: Indep    GAIT: Gait pattern:  decreased speed due to guarding from symptoms Distance walked:  Assistive device utilized:  None Level of assistance: Complete Independence Comments:     VESTIBULAR ASSESSMENT:  GENERAL OBSERVATION: wears progressive lens glasses   SYMPTOM BEHAVIOR:  Subjective history: 7 day hx of positional vertigo  Non-Vestibular symptoms: neck pain  Type of dizziness: Spinning/Vertigo  Frequency: daily  Duration: seconds-minutes   Aggravating factors: Induced by position change: lying supine, rolling to the left, and supine to sit  Relieving factors: slow movements  Progression of symptoms: better  OCULOMOTOR EXAM:  Ocular Alignment: normal  Ocular ROM: No Limitations  Spontaneous Nystagmus: absent  Gaze-Induced Nystagmus: absent  Smooth Pursuits: intact  Saccades: slow  Convergence/Divergence:  cm    VESTIBULAR - OCULAR REFLEX:   Slow VOR: Comment: limited ROM due to neck pain  VOR Cancellation: Comment: slow, notes some provocation of symptoms  Head-Impulse Test: unable  to perform due to neck pain  Dynamic Visual Acuity: Not able to be assessed   POSITIONAL TESTING: Right Dix-Hallpike: no nystagmus Left Dix-Hallpike: no nystagmus Right Roll Test: no nystagmus Left Roll Test: no nystagmus  MOTION SENSITIVITY:  Motion Sensitivity Quotient Intensity: 0 = none, 1 = Lightheaded, 2 = Mild, 3 = Moderate, 4 = Severe, 5 = Vomiting  Intensity  1. Sitting to supine   2. Supine to L side   3. Supine to R side   4. Supine to sitting   5. L Hallpike-Dix   6. Up from L    7. R Hallpike-Dix   8. Up from R    9. Sitting, head tipped to L knee   10. Head up from L knee   11. Sitting, head tipped to R knee   12. Head up from R knee   13. Sitting head turns x5   14.Sitting head nods x5   15. In stance, 180 turn to L    16. In stance, 180 turn to R     OTHOSTATICS: not done  FUNCTIONAL GAIT: Functional gait assessment: TBD M-CTSIB: TBD                                                                                                                            TREATMENT  DATE: 04/29/23    PATIENT EDUCATION: Education details: assessment details, HEP initiation Person educated: Patient and Spouse Education method: Explanation and Handouts Education comprehension: needs further education  HOME EXERCISE PROGRAM:  Access Code: [email or web address] URL: [email or web address] Date: 04/29/2023 Prepared by: Shary Decamp  Exercises - Cervical Extension AROM with Strap  - 1 x daily - 7 x weekly - 3 sets - 10 reps - Mid-Lower Cervical Extension SNAG with Strap  - 1 x daily - 7 x weekly - 3 sets - 10 reps - Standing Lower Cervical and Upper Thoracic Stretch  - 1 x daily - 7 x weekly - 3 sets - 10 reps - Supine Isometric Neck Extension  - 1 x daily - 7 x weekly - 1-3 sets - 10 reps - 2 sec hold  GOALS: Goals reviewed with patient? Yes  SHORT TERM GOALS: Target date: same as LTG    LONG TERM GOALS: Target date: 07/11/2023    Patient will be independent in HEP to improve functional outcomes Baseline:  Goal status: MET 05/26/23  2.  Demo improved cervical spine ROM to 35 degrees rotation to improve comfort/safety when driving Baseline: 25 degrees R/L; improved 05/26/23 Goal status: MET 05/26/23  3.  Demo score of 25/30 Functional Gait Assessment to manifest improved balance and reduced risk for falls Baseline: 26/30 05/26/23 Goal status: MET 05/26/23  4.  Pt to report neck pain not exceeding 2/10 during daily activities to improve comfort and activity tolerance Baseline: 7-8/10 with sharp pains with certain movements/activities; reports 1-2/10 on average day to day 05/26/23 Goal status: MET 05/26/23  4.  Pt to report  50% improvement in neck pain when climbing hills.  Baseline: reports pain Goal status: INITIAL 06/13/23  4.  Pt to report 0/10 dizziness with bed mobility.  Baseline: c/o dizziness sitting up in the AM and + positional dizziness  Goal status: INITIAL 06/13/23     ASSESSMENT:  CLINICAL IMPRESSION: Patient arrived to session with  report of remaining dizziness upon sitting out of bed and c/o neck pain when walking up a hill .Positional testing revealed positive R DH, treated with R epley x 1 without improvement in symptoms. Reviewed habituation which was provided last session to address this. Goals were updated to reflect patient's remaining deficits. Would benefit from additional skilled PT services 1-2x/week for 4 weeks to address goals.   OBJECTIVE IMPAIRMENTS: Abnormal gait, decreased activity tolerance, decreased balance, decreased ROM, dizziness, hypomobility, increased muscle spasms, and pain.   ACTIVITY LIMITATIONS: carrying, lifting, bending, reach over head, and locomotion level  PARTICIPATION LIMITATIONS: meal prep, cleaning, laundry, driving, shopping, community activity, yard work, and exercise routine  PERSONAL FACTORS: Age, Time since onset of injury/illness/exacerbation, and 1 comorbidity: PMH  are also affecting patient's functional outcome.   REHAB POTENTIAL: Excellent  CLINICAL DECISION MAKING: Evolving/moderate complexity  EVALUATION COMPLEXITY: Moderate   PLAN:  PT FREQUENCY: 1-2x/week  PT DURATION: 4 weeks  PLANNED INTERVENTIONS: 97164- PT Re-evaluation, 97110-Therapeutic exercises, 97530- Therapeutic activity, 97112- Neuromuscular re-education, 97535- Self Care, 16109- Manual therapy, [phone]- Gait training, [phone]- Canalith repositioning, [id number]- Aquatic Therapy, [phone]- Electrical stimulation (manual), Patient/Family education, Balance training, Taping, Dry Needling, Joint mobilization, Spinal mobilization, Vestibular training, DME instructions, Cryotherapy, and Moist heat  PLAN FOR NEXT SESSION:  retest R DH   Baldemar Friday, PT, DPT 06/13/23 11:50 AM  [facility] [address], [address] [place], [place] [address] Phone # [phone] Fax # [phone]

## 2023-06-13 ENCOUNTER — Ambulatory Visit: Attending: Internal Medicine | Admitting: Physical Therapy

## 2023-06-13 ENCOUNTER — Encounter: Payer: Self-pay | Admitting: Physical Therapy

## 2023-06-13 DIAGNOSIS — R42 Dizziness and giddiness: Secondary | ICD-10-CM | POA: Insufficient documentation

## 2023-06-13 DIAGNOSIS — M542 Cervicalgia: Secondary | ICD-10-CM | POA: Insufficient documentation

## 2023-06-16 ENCOUNTER — Encounter: Payer: Self-pay | Admitting: Physical Therapy

## 2023-06-16 ENCOUNTER — Ambulatory Visit: Admitting: Physical Therapy

## 2023-06-16 DIAGNOSIS — M542 Cervicalgia: Secondary | ICD-10-CM

## 2023-06-16 DIAGNOSIS — R42 Dizziness and giddiness: Secondary | ICD-10-CM

## 2023-06-16 NOTE — Therapy (Signed)
 OUTPATIENT PHYSICAL THERAPY VESTIBULAR TREATMENT NOTE/PROGRESS NOTE     Patient Name: Stephanie Aguilar MRN: [id number] DOB:1940/06/25, 83 y.o., female Today's Date: 06/16/2023  Progress Note Reporting Period 04/29/2023 to 06/16/2023  See note below for Objective Data and Assessment of Progress/Goals.      END OF SESSION:  PT End of Session - 06/16/23 0937     Visit Number 10    Number of Visits 17    Date for PT Re-Evaluation 07/11/23    Authorization Type [employer]    Authorization Time Period new auth submitted    PT Start Time 0850    PT Stop Time 0928    PT Time Calculation (min) 38 min    Activity Tolerance Patient tolerated treatment well    Behavior During Therapy WFL for tasks assessed/performed                   Past Medical History:  Diagnosis Date   Allergy    Anxiety    Cataract    Depression    Hyperlipidemia    Hypertension    Past Surgical History:  Procedure Laterality Date   ABDOMINAL HYSTERECTOMY     APPENDECTOMY     BREAST SURGERY     DENTAL SURGERY     TONSILLECTOMY AND ADENOIDECTOMY     Patient Active Problem List   Diagnosis Date Noted   Benign paroxysmal positional vertigo due to bilateral vestibular disorder 04/30/2023   Peripheral vertigo involving left ear 04/21/2023   CKD (chronic kidney disease), stage III (HCC) 04/13/2016   PAD (peripheral artery disease) (HCC) 04/12/2016   Anxiety disorder 11/07/2015   Carotid bruit 07/06/2013   Essential hypertension 02/04/2012   Dyslipidemia 02/04/2012   Menopausal syndrome on hormone replacement therapy 02/04/2012    PCP: Tressa Busman, MD REFERRING PROVIDER: Lorin Glass, MD  REFERRING DIAG: H81.12 (ICD-10-CM) - Benign paroxysmal positional vertigo of left ear  THERAPY DIAG:  Dizziness and giddiness  Cervicalgia  ONSET DATE: 7 days ago  Rationale for Evaluation and Treatment: Rehabilitation  SUBJECTIVE:   SUBJECTIVE  STATEMENT: Figured out with [name] that the R side was involved with vertigo and the treatment has really helped.  Still being careful, but I'm very thankful that it is better.     Pt accompanied by: self   PERTINENT HISTORY: PMH significant for HTN, HLD, anxiety/depression. Patient presented to the ED at [facility] yesterday 1/20 with complaint of dizziness with nausea upon getting up in the morning.  She has been experiencing it for the last 5 days every morning.  She feels the room spins around her for a short time.  The day prior to presentation, it happened when she was having a photo as he had to sit down.  PAIN:  Are you having pain? Yes: NPRS scale: 0/10 Pain location: L neck, upper back, shoulders Pain description: sharp Aggravating factors: neck rotation, reaching Relieving factors: slow movements, avoidance  PRECAUTIONS: None  RED FLAGS: None   WEIGHT BEARING RESTRICTIONS: No  FALLS: Has patient fallen in last 6 months? No  LIVING ENVIRONMENT: Lives with: lives with their spouse Lives in: House/apartment, 2nd floor BR Stairs: exterior/interior w/ HR Has following equipment at home: None, has a shower chair and walker at her disposal  PLOF: Independent, walks for exercise, yard work  PATIENT GOALS: eliminate symptoms  OBJECTIVE:    TODAY'S TREATMENT: 06/16/2023 Activity Comments  R DH Mild nystagmus, R rotary, mild dizziness <30 sec  R Epley (pillow  under shoulders) Tolerates well, pt does report diplopia in position 2 (no c/o upon sitting  R DH Mild nystagmus, R rotary and double vision  R Epley pillow under shoulders Tolerates well, no c/o upon sitting  Chin tucks Seated, cues to avoid pain  Shoulder rolls Seated, cues for relaxed motion       PATIENT EDUCATION: Education details: Reviewed brandt daroff , answered pt's questions, discussed gentle shoulder motions/posture breaks Person educated: Patient Education method: Medical illustrator,  handout Education comprehension: verbalized understanding and returned demonstration    HOME EXERCISE PROGRAM: Access Code: [email or web address] URL: [email or web address] Date: 06/13/2023 Prepared by: [facility] - Outpatient  Rehab - [facility]  Exercises - Cervical Extension AROM with Strap  - 1 x daily - 7 x weekly - 3 sets - 10 reps - Mid-Lower Cervical Extension SNAG with Strap  - 1 x daily - 7 x weekly - 3 sets - 10 reps - Standing Lower Cervical and Upper Thoracic Stretch  - 1 x daily - 7 x weekly - 3 sets - 10 reps - Supine Isometric Neck Extension  - 1 x daily - 7 x weekly - 1-3 sets - 10 reps - 2 sec hold - Seated Assisted Cervical Rotation with Towel  - 1-2 x daily - 7 x weekly - 1 sets - 3-5 reps - 15-30 sec hold - Brandt-Daroff Vestibular Exercise (Mirrored)  - 1 x daily - 5 x weekly - 2 sets - 3-5 reps   -------------------------------------------------------------- Note: Objective measures were completed at Evaluation unless otherwise noted.  DIAGNOSTIC FINDINGS:   COGNITION: Overall cognitive status: Within functional limits for tasks assessed   SENSATION: WFL  EDEMA:    PALPATION:  tenderness and trigger points appreciated along medial scapular border R > L   DTRs:  NT  POSTURE:  No Significant postural limitations  Cervical ROM:    Active A/PROM (deg) eval  Flexion 60  Extension 20  Right lateral flexion 15  Left lateral flexion 15  Right rotation 25  Left rotation 25  (Blank rows = not tested)  STRENGTH:   LOWER EXTREMITY MMT:   MMT Right eval Left eval  Hip flexion    Hip abduction    Hip adduction    Hip internal rotation    Hip external rotation    Knee flexion    Knee extension    Ankle dorsiflexion    Ankle plantarflexion    Ankle inversion    Ankle eversion    (Blank rows = not tested)  BED MOBILITY:  indep  TRANSFERS: Indep    GAIT: Gait pattern:  decreased speed due to guarding from  symptoms Distance walked:  Assistive device utilized: None Level of assistance: Complete Independence Comments:     VESTIBULAR ASSESSMENT:  GENERAL OBSERVATION: wears progressive lens glasses   SYMPTOM BEHAVIOR:  Subjective history: 7 day hx of positional vertigo  Non-Vestibular symptoms: neck pain  Type of dizziness: Spinning/Vertigo  Frequency: daily  Duration: seconds-minutes   Aggravating factors: Induced by position change: lying supine, rolling to the left, and supine to sit  Relieving factors: slow movements  Progression of symptoms: better  OCULOMOTOR EXAM:  Ocular Alignment: normal  Ocular ROM: No Limitations  Spontaneous Nystagmus: absent  Gaze-Induced Nystagmus: absent  Smooth Pursuits: intact  Saccades: slow  Convergence/Divergence:  cm    VESTIBULAR - OCULAR REFLEX:   Slow VOR: Comment: limited ROM due to neck pain  VOR Cancellation: Comment: slow, notes some provocation of symptoms  Head-Impulse Test: unable to perform due to neck pain  Dynamic Visual Acuity: Not able to be assessed   POSITIONAL TESTING: Right Dix-Hallpike: no nystagmus Left Dix-Hallpike: no nystagmus Right Roll Test: no nystagmus Left Roll Test: no nystagmus  MOTION SENSITIVITY:  Motion Sensitivity Quotient Intensity: 0 = none, 1 = Lightheaded, 2 = Mild, 3 = Moderate, 4 = Severe, 5 = Vomiting  Intensity  1. Sitting to supine   2. Supine to L side   3. Supine to R side   4. Supine to sitting   5. L Hallpike-Dix   6. Up from L    7. R Hallpike-Dix   8. Up from R    9. Sitting, head tipped to L knee   10. Head up from L knee   11. Sitting, head tipped to R knee   12. Head up from R knee   13. Sitting head turns x5   14.Sitting head nods x5   15. In stance, 180 turn to L    16. In stance, 180 turn to R     OTHOSTATICS: not done  FUNCTIONAL GAIT: Functional gait assessment: TBD M-CTSIB: TBD                                                                                                                             TREATMENT DATE: 04/29/23    PATIENT EDUCATION: Education details: assessment details, HEP initiation Person educated: Patient and Spouse Education method: Explanation and Handouts Education comprehension: needs further education  HOME EXERCISE PROGRAM:  Access Code: [email or web address] URL: [email or web address] Date: 04/29/2023 Prepared by: Shary Decamp  Exercises - Cervical Extension AROM with Strap  - 1 x daily - 7 x weekly - 3 sets - 10 reps - Mid-Lower Cervical Extension SNAG with Strap  - 1 x daily - 7 x weekly - 3 sets - 10 reps - Standing Lower Cervical and Upper Thoracic Stretch  - 1 x daily - 7 x weekly - 3 sets - 10 reps - Supine Isometric Neck Extension  - 1 x daily - 7 x weekly - 1-3 sets - 10 reps - 2 sec hold  GOALS: Goals reviewed with patient? Yes  SHORT TERM GOALS: Target date: same as LTG    LONG TERM GOALS: Target date: 07/11/2023    Patient will be independent in HEP to improve functional outcomes Baseline:  Goal status: MET 05/26/23  2.  Demo improved cervical spine ROM to 35 degrees rotation to improve comfort/safety when driving Baseline: 25 degrees R/L; improved 05/26/23 Goal status: MET 05/26/23  3.  Demo score of 25/30 Functional Gait Assessment to manifest improved balance and reduced risk for falls Baseline: 26/30 05/26/23 Goal status: MET 05/26/23  4.  Pt to report neck pain not exceeding 2/10 during daily activities to improve comfort and activity tolerance Baseline: 7-8/10 with sharp pains with certain movements/activities; reports 1-2/10 on average day to day 05/26/23 Goal status: MET 05/26/23  4.  Pt to report 50% improvement in neck pain when climbing hills.  Baseline: reports pain Goal status: INITIAL 06/13/23  4.  Pt to report 0/10 dizziness with bed mobility.  Baseline: c/o dizziness sitting up in the AM and + positional dizziness  Goal status: INITIAL  06/13/23     ASSESSMENT:  CLINICAL IMPRESSION: 10th Visit PN:  Pt presents today with definite improvement with dizziness with bed mobility. Skilled PT session focused on assessing positional vertigo; she does have nystagmus (R rotational) with R DH; treated with Epley x 2.   She does not have symptoms upon sitting up from maneuver.  She continues to report some increased neck pain towards the end of the day.  Discussed/educated pt on gentle neck exercises throughout the day to lessen neck pain at the end of the day.  Pt is continuing to progress towards goals and will continue to benefit from skilled PT towards goals for improved functional mobility and decreased fall risk.   OBJECTIVE IMPAIRMENTS: Abnormal gait, decreased activity tolerance, decreased balance, decreased ROM, dizziness, hypomobility, increased muscle spasms, and pain.   ACTIVITY LIMITATIONS: carrying, lifting, bending, reach over head, and locomotion level  PARTICIPATION LIMITATIONS: meal prep, cleaning, laundry, driving, shopping, community activity, yard work, and exercise routine  PERSONAL FACTORS: Age, Time since onset of injury/illness/exacerbation, and 1 comorbidity: PMH  are also affecting patient's functional outcome.   REHAB POTENTIAL: Excellent  CLINICAL DECISION MAKING: Evolving/moderate complexity  EVALUATION COMPLEXITY: Moderate   PLAN:  PT FREQUENCY: 1-2x/week  PT DURATION: 4 weeks  PLANNED INTERVENTIONS: 97164- PT Re-evaluation, 97110-Therapeutic exercises, 97530- Therapeutic activity, [id number]- Neuromuscular re-education, 97535- Self Care, [id number]- Manual therapy, [id number]- Gait training, [phone]- Canalith repositioning, [id number]- Aquatic Therapy, [phone]- Electrical stimulation (manual), Patient/Family education, Balance training, Taping, Dry Needling, Joint mobilization, Spinal mobilization, Vestibular training, DME instructions, Cryotherapy, and Moist heat  PLAN FOR NEXT SESSION:  retest R DH and treat as needed.   Review and progress gentle neck exercises.   Lonia Blood, PT 06/16/23 9:38 AM Phone: [phone] Fax: [phone]  [facility] at [facility] [address] [place], [address] [place], [place] [address] Phone # [phone] Fax # [phone]

## 2023-06-19 NOTE — Therapy (Addendum)
 OUTPATIENT PHYSICAL THERAPY VESTIBULAR TREATMENT NOTE     Patient Name: Stephanie Aguilar MRN: [id number] DOB:Dec 29, 1940, 83 y.o., female Today's Date: 06/23/2023      END OF SESSION:  PT End of Session - 06/23/23 0922     Visit Number 12    Number of Visits 17    Date for PT Re-Evaluation 07/11/23    Authorization Type [employer]    Authorization Time Period approved 4 PT visits from 06/13/2023 to 07/11/2023    PT Start Time 0847    PT Stop Time 0925    PT Time Calculation (min) 38 min    Activity Tolerance Patient tolerated treatment well    Behavior During Therapy [facility] for tasks assessed/performed                    Past Medical History:  Diagnosis Date   Allergy    Anxiety    Cataract    Depression    Hyperlipidemia    Hypertension    Past Surgical History:  Procedure Laterality Date   ABDOMINAL HYSTERECTOMY     APPENDECTOMY     BREAST SURGERY     DENTAL SURGERY     TONSILLECTOMY AND ADENOIDECTOMY     Patient Active Problem List   Diagnosis Date Noted   Benign paroxysmal positional vertigo due to bilateral vestibular disorder 04/30/2023   Peripheral vertigo involving left ear 04/21/2023   CKD (chronic kidney disease), stage III (HCC) 04/13/2016   PAD (peripheral artery disease) (HCC) 04/12/2016   Anxiety disorder 11/07/2015   Carotid bruit 07/06/2013   Essential hypertension 02/04/2012   Dyslipidemia 02/04/2012   Menopausal syndrome on hormone replacement therapy 02/04/2012    PCP: Tressa Busman, MD REFERRING PROVIDER: Lorin Glass, MD  REFERRING DIAG: H81.12 (ICD-10-CM) - Benign paroxysmal positional vertigo of left ear  THERAPY DIAG:  Dizziness and giddiness  Cervicalgia  ONSET DATE: 7 days ago  Rationale for Evaluation and Treatment: Rehabilitation  SUBJECTIVE:   SUBJECTIVE STATEMENT: "I did great Friday, great Saturday, and yesterday I was bumbly. Not terrible, not awful." Yesterday she has  reached down to pick something up and when she came up she felt pretty dizzy and unsteady.     Pt accompanied by: self   PERTINENT HISTORY: PMH significant for HTN, HLD, anxiety/depression. Patient presented to the ED at [facility] yesterday 1/20 with complaint of dizziness with nausea upon getting up in the morning.  She has been experiencing it for the last 5 days every morning.  She feels the room spins around her for a short time.  The day prior to presentation, it happened when she was having a photo as he had to sit down.  PAIN:  Are you having pain? Yes: NPRS scale: 0/10 Pain location: L neck, upper back, shoulders Pain description: sharp Aggravating factors: neck rotation, reaching Relieving factors: slow movements, avoidance  PRECAUTIONS: None  RED FLAGS: None   WEIGHT BEARING RESTRICTIONS: No  FALLS: Has patient fallen in last 6 months? No  LIVING ENVIRONMENT: Lives with: lives with their spouse Lives in: House/apartment, 2nd floor BR Stairs: exterior/interior w/ HR Has following equipment at home: None, has a shower chair and walker at her disposal  PLOF: Independent, walks for exercise, yard work  PATIENT GOALS: eliminate symptoms  OBJECTIVE:     TODAY'S TREATMENT: 06/23/23 Activity Comments  R DH Small amplitude R upbeating torsional nystagmus lasting 25 sec; c/o diplopia. C/o mild dizziness/unsteadiness upon sitting up  L [facility]  Negative; c/o diplopia. C/o "very slight" dizziness/unsteadiness upon sitting up  sitting anterior canal habituation 3x10" each C/o mild wooziness upon sitting up. Cueing for proper positioning and quicker pace on the way up. Cueing to stabilize head/neck   bending to overhead reach, placing cones on tall mirror  Good stability; c/o 1 episode of imbalance   Standing bending to pick up and match cones by color from floor Good stability; cues to tuck chin slightly. Pt reported very mild imbalance               HOME EXERCISE  PROGRAM Last updated: 06/23/23 Access Code: [email or web address] URL: [email or web address] Date: 06/23/2023 Prepared by: [facility] - Outpatient  Rehab - [facility]  Exercises - Cervical Extension AROM with Strap  - 1 x daily - 7 x weekly - 3 sets - 10 reps - Mid-Lower Cervical Extension SNAG with Strap  - 1 x daily - 7 x weekly - 3 sets - 10 reps - Standing Lower Cervical and Upper Thoracic Stretch  - 1 x daily - 7 x weekly - 3 sets - 10 reps - Supine Isometric Neck Extension  - 1 x daily - 7 x weekly - 1-3 sets - 10 reps - 2 sec hold - Seated Assisted Cervical Rotation with Towel  - 1-2 x daily - 7 x weekly - 1 sets - 3-5 reps - 15-30 sec hold - Brandt-Daroff Vestibular Exercise (Mirrored)  - 1 x daily - 5 x weekly - 2 sets - 3-5 reps - Seated Nose to Left Knee Vestibular Habituation  - 1 x daily - 5 x weekly - 1-2 sets - 3 reps - 10 sec  hold - Seated Nose to Left Knee Vestibular Habituation  - 1 x daily - 5 x weekly - 1-2 sets - 3 reps - 10 sec  hold     PATIENT EDUCATION: Education details: HEP update with edu for safety  Person educated: Patient Education method: Explanation, Demonstration, Tactile cues, Verbal cues, and Handouts Education comprehension: verbalized understanding and returned demonstration   -------------------------------------------------------------- Note: Objective measures were completed at Evaluation unless otherwise noted.  DIAGNOSTIC FINDINGS:   COGNITION: Overall cognitive status: Within functional limits for tasks assessed   SENSATION: WFL  EDEMA:    PALPATION:  tenderness and trigger points appreciated along medial scapular border R > L   DTRs:  NT  POSTURE:  No Significant postural limitations  Cervical ROM:    Active A/PROM (deg) eval  Flexion 60  Extension 20  Right lateral flexion 15  Left lateral flexion 15  Right rotation 25  Left rotation 25  (Blank rows = not tested)  STRENGTH:   LOWER EXTREMITY MMT:    MMT Right eval Left eval  Hip flexion    Hip abduction    Hip adduction    Hip internal rotation    Hip external rotation    Knee flexion    Knee extension    Ankle dorsiflexion    Ankle plantarflexion    Ankle inversion    Ankle eversion    (Blank rows = not tested)  BED MOBILITY:  indep  TRANSFERS: Indep    GAIT: Gait pattern:  decreased speed due to guarding from symptoms Distance walked:  Assistive device utilized: None Level of assistance: Complete Independence Comments:     VESTIBULAR ASSESSMENT:  GENERAL OBSERVATION: wears progressive lens glasses   SYMPTOM BEHAVIOR:  Subjective history: 7 day hx of positional vertigo  Non-Vestibular symptoms: neck pain  Type  of dizziness: Spinning/Vertigo  Frequency: daily  Duration: seconds-minutes   Aggravating factors: Induced by position change: lying supine, rolling to the left, and supine to sit  Relieving factors: slow movements  Progression of symptoms: better  OCULOMOTOR EXAM:  Ocular Alignment: normal  Ocular ROM: No Limitations  Spontaneous Nystagmus: absent  Gaze-Induced Nystagmus: absent  Smooth Pursuits: intact  Saccades: slow  Convergence/Divergence:  cm    VESTIBULAR - OCULAR REFLEX:   Slow VOR: Comment: limited ROM due to neck pain  VOR Cancellation: Comment: slow, notes some provocation of symptoms  Head-Impulse Test: unable to perform due to neck pain  Dynamic Visual Acuity: Not able to be assessed   POSITIONAL TESTING: Right Dix-Hallpike: no nystagmus Left Dix-Hallpike: no nystagmus Right Roll Test: no nystagmus Left Roll Test: no nystagmus  MOTION SENSITIVITY:  Motion Sensitivity Quotient Intensity: 0 = none, 1 = Lightheaded, 2 = Mild, 3 = Moderate, 4 = Severe, 5 = Vomiting  Intensity  1. Sitting to supine   2. Supine to L side   3. Supine to R side   4. Supine to sitting   5. L Hallpike-Dix   6. Up from L    7. R Hallpike-Dix   8. Up from R    9. Sitting, head tipped to  L knee   10. Head up from L knee   11. Sitting, head tipped to R knee   12. Head up from R knee   13. Sitting head turns x5   14.Sitting head nods x5   15. In stance, 180 turn to L    16. In stance, 180 turn to R     OTHOSTATICS: not done  FUNCTIONAL GAIT: Functional gait assessment: TBD M-CTSIB: TBD                                                                                                                            TREATMENT DATE: 04/29/23    PATIENT EDUCATION: Education details: assessment details, HEP initiation Person educated: Patient and Spouse Education method: Explanation and Handouts Education comprehension: needs further education  HOME EXERCISE PROGRAM:  Access Code: [email or web address] URL: [email or web address] Date: 04/29/2023 Prepared by: Shary Decamp  Exercises - Cervical Extension AROM with Strap  - 1 x daily - 7 x weekly - 3 sets - 10 reps - Mid-Lower Cervical Extension SNAG with Strap  - 1 x daily - 7 x weekly - 3 sets - 10 reps - Standing Lower Cervical and Upper Thoracic Stretch  - 1 x daily - 7 x weekly - 3 sets - 10 reps - Supine Isometric Neck Extension  - 1 x daily - 7 x weekly - 1-3 sets - 10 reps - 2 sec hold  GOALS: Goals reviewed with patient? Yes  SHORT TERM GOALS: Target date: same as LTG    LONG TERM GOALS: Target date: 07/11/2023    Patient will be independent in HEP to improve functional outcomes Baseline:  Goal status: MET  05/26/23  2.  Demo improved cervical spine ROM to 35 degrees rotation to improve comfort/safety when driving Baseline: 25 degrees R/L; improved 05/26/23 Goal status: MET 05/26/23  3.  Demo score of 25/30 Functional Gait Assessment to manifest improved balance and reduced risk for falls Baseline: 26/30 05/26/23 Goal status: MET 05/26/23  4.  Pt to report neck pain not exceeding 2/10 during daily activities to improve comfort and activity tolerance Baseline: 7-8/10 with sharp pains with certain  movements/activities; reports 1-2/10 on average day to day 05/26/23 Goal status: MET 05/26/23  4.  Pt to report 50% improvement in neck pain when climbing hills.  Baseline: reports pain Goal status: INITIAL 06/13/23  4.  Pt to report 0/10 dizziness with bed mobility.  Baseline: c/o dizziness sitting up in the AM and + positional dizziness  Goal status: INITIAL 06/13/23     ASSESSMENT:  CLINICAL IMPRESSION: Patient arrived to session with report of improved dizziness for 2 days after last session, however some dizziness and imbalance occurred yesterday. Positional testing was still positive for R BPPV however patient demonstrates smaller amplitude and less symptoms compared to last session.  Provided additional habituation activities for bending- patient tolerated this well. Only complaint was mild imbalance which occurred very rarely. Updated HEP with seated habituation which was well-tolerated today. Patient reported understanding and without complaints upon leaving.   OBJECTIVE IMPAIRMENTS: Abnormal gait, decreased activity tolerance, decreased balance, decreased ROM, dizziness, hypomobility, increased muscle spasms, and pain.   ACTIVITY LIMITATIONS: carrying, lifting, bending, reach over head, and locomotion level  PARTICIPATION LIMITATIONS: meal prep, cleaning, laundry, driving, shopping, community activity, yard work, and exercise routine  PERSONAL FACTORS: Age, Time since onset of injury/illness/exacerbation, and 1 comorbidity: PMH  are also affecting patient's functional outcome.   REHAB POTENTIAL: Excellent  CLINICAL DECISION MAKING: Evolving/moderate complexity  EVALUATION COMPLEXITY: Moderate   PLAN:  PT FREQUENCY: 1-2x/week  PT DURATION: 4 weeks  PLANNED INTERVENTIONS: 97164- PT Re-evaluation, 97110-Therapeutic exercises, 97530- Therapeutic activity, [id number]- Neuromuscular re-education, 97535- Self Care, 16109- Manual therapy, [id number]- Gait training, [id number]- Canalith  repositioning, [id number]- Aquatic Therapy, [phone]- Electrical stimulation (manual), Patient/Family education, Balance training, Taping, Dry Needling, Joint mobilization, Spinal mobilization, Vestibular training, DME instructions, Cryotherapy, and Moist heat  PLAN FOR NEXT SESSION:  retest R DH and treat as needed.  Continue habituation. Review and progress gentle neck exercises.   Baldemar Friday, PT, DPT 06/23/23 9:27 AM  [facility] [address] [place], [address] [place], [place] [address] Phone # [phone] Fax # [phone]   Referring diagnosis?  H81.12 (ICD-10-CM) - Benign paroxysmal positional vertigo of left ear Treatment diagnosis? (if different than referring diagnosis)  Dizziness and giddiness Cervicalgia  What was this (referring dx) caused by? []  Surgery []  Fall []  Ongoing issue []  Arthritis [x]  Other: insidious  Laterality: [x]  Rt []  Lt []  Both  Check all possible CPT codes:  *CHOOSE 10 OR LESS*    See Planned Interventions listed in the Plan section of the Evaluation.

## 2023-06-19 NOTE — Therapy (Signed)
 OUTPATIENT PHYSICAL THERAPY VESTIBULAR TREATMENT NOTE     Patient Name: Stephanie Aguilar MRN: [id number] DOB:Jul 15, 1940, 83 y.o., female Today's Date: 06/20/2023      END OF SESSION:  PT End of Session - 06/20/23 0930     Visit Number 11    Number of Visits 17    Date for PT Re-Evaluation 07/11/23    Authorization Type [employer] Medicare    Authorization Time Period approved 4 PT visits from 06/13/2023 to 07/11/2023    PT Start Time 0848    PT Stop Time 0930    PT Time Calculation (min) 42 min    Activity Tolerance Patient tolerated treatment well    Behavior During Therapy [facility] for tasks assessed/performed                    Past Medical History:  Diagnosis Date   Allergy    Anxiety    Cataract    Depression    Hyperlipidemia    Hypertension    Past Surgical History:  Procedure Laterality Date   ABDOMINAL HYSTERECTOMY     APPENDECTOMY     BREAST SURGERY     DENTAL SURGERY     TONSILLECTOMY AND ADENOIDECTOMY     Patient Active Problem List   Diagnosis Date Noted   Benign paroxysmal positional vertigo due to bilateral vestibular disorder 04/30/2023   Peripheral vertigo involving left ear 04/21/2023   CKD (chronic kidney disease), stage III (HCC) 04/13/2016   PAD (peripheral artery disease) (HCC) 04/12/2016   Anxiety disorder 11/07/2015   Carotid bruit 07/06/2013   Essential hypertension 02/04/2012   Dyslipidemia 02/04/2012   Menopausal syndrome on hormone replacement therapy 02/04/2012    PCP: Tressa Busman, MD REFERRING PROVIDER: Lorin Glass, MD  REFERRING DIAG: H81.12 (ICD-10-CM) - Benign paroxysmal positional vertigo of left ear  THERAPY DIAG:  Dizziness and giddiness  Cervicalgia  ONSET DATE: 7 days ago  Rationale for Evaluation and Treatment: Rehabilitation  SUBJECTIVE:   SUBJECTIVE STATEMENT: Good this morning, yesterday was not good. Felt good on Monday and worked outside. Tuesday and Thursday I felt  bumbly. The neck is getting better.    Pt accompanied by: self   PERTINENT HISTORY: PMH significant for HTN, HLD, anxiety/depression. Patient presented to the ED at [facility] yesterday 1/20 with complaint of dizziness with nausea upon getting up in the morning.  She has been experiencing it for the last 5 days every morning.  She feels the room spins around her for a short time.  The day prior to presentation, it happened when she was having a photo as he had to sit down.  PAIN:  Are you having pain? Yes: NPRS scale: 0/10 Pain location: L neck, upper back, shoulders Pain description: sharp Aggravating factors: neck rotation, reaching Relieving factors: slow movements, avoidance  PRECAUTIONS: None  RED FLAGS: None   WEIGHT BEARING RESTRICTIONS: No  FALLS: Has patient fallen in last 6 months? No  LIVING ENVIRONMENT: Lives with: lives with their spouse Lives in: House/apartment, 2nd floor BR Stairs: exterior/interior w/ HR Has following equipment at home: None, has a shower chair and walker at her disposal  PLOF: Independent, walks for exercise, yard work  PATIENT GOALS: eliminate symptoms  OBJECTIVE:     TODAY'S TREATMENT: 06/20/23 Activity Comments  review of R brandt daroff 3x  Required correction of head position. C/o a little woozy. Improved with additional reps   R DH R upbeating torsional nystagmus followed by smaller amplitude persisting  nystagmus   R mastoid vibration 20 sec   R DH with R mastoid vibration 20 sec No dizziness, less intense nystagmus  R epley  Tolerated well  R DH C/o diplopia; R upbeating torsional nystagmus followed by smaller amplitude persisting nystagmus   R universal repositioning maneuver  Tolerated well   R DH C/o diplopia; much less intense R upbeating torsional nystagmus lasting ~15 sec            HOME EXERCISE PROGRAM: Access Code: [email or web address] URL: [email or web address] Date: 06/13/2023 Prepared by: [facility] - Outpatient   Rehab - [facility]  Exercises - Cervical Extension AROM with Strap  - 1 x daily - 7 x weekly - 3 sets - 10 reps - Mid-Lower Cervical Extension SNAG with Strap  - 1 x daily - 7 x weekly - 3 sets - 10 reps - Standing Lower Cervical and Upper Thoracic Stretch  - 1 x daily - 7 x weekly - 3 sets - 10 reps - Supine Isometric Neck Extension  - 1 x daily - 7 x weekly - 1-3 sets - 10 reps - 2 sec hold - Seated Assisted Cervical Rotation with Towel  - 1-2 x daily - 7 x weekly - 1 sets - 3-5 reps - 15-30 sec hold - Brandt-Daroff Vestibular Exercise (Mirrored)  - 1 x daily - 5 x weekly - 2 sets - 3-5 reps   -------------------------------------------------------------- Note: Objective measures were completed at Evaluation unless otherwise noted.  DIAGNOSTIC FINDINGS:   COGNITION: Overall cognitive status: Within functional limits for tasks assessed   SENSATION: WFL  EDEMA:    PALPATION:  tenderness and trigger points appreciated along medial scapular border R > L   DTRs:  NT  POSTURE:  No Significant postural limitations  Cervical ROM:    Active A/PROM (deg) eval  Flexion 60  Extension 20  Right lateral flexion 15  Left lateral flexion 15  Right rotation 25  Left rotation 25  (Blank rows = not tested)  STRENGTH:   LOWER EXTREMITY MMT:   MMT Right eval Left eval  Hip flexion    Hip abduction    Hip adduction    Hip internal rotation    Hip external rotation    Knee flexion    Knee extension    Ankle dorsiflexion    Ankle plantarflexion    Ankle inversion    Ankle eversion    (Blank rows = not tested)  BED MOBILITY:  indep  TRANSFERS: Indep    GAIT: Gait pattern:  decreased speed due to guarding from symptoms Distance walked:  Assistive device utilized: None Level of assistance: Complete Independence Comments:     VESTIBULAR ASSESSMENT:  GENERAL OBSERVATION: wears progressive lens glasses   SYMPTOM BEHAVIOR:  Subjective history: 7  day hx of positional vertigo  Non-Vestibular symptoms: neck pain  Type of dizziness: Spinning/Vertigo  Frequency: daily  Duration: seconds-minutes   Aggravating factors: Induced by position change: lying supine, rolling to the left, and supine to sit  Relieving factors: slow movements  Progression of symptoms: better  OCULOMOTOR EXAM:  Ocular Alignment: normal  Ocular ROM: No Limitations  Spontaneous Nystagmus: absent  Gaze-Induced Nystagmus: absent  Smooth Pursuits: intact  Saccades: slow  Convergence/Divergence:  cm    VESTIBULAR - OCULAR REFLEX:   Slow VOR: Comment: limited ROM due to neck pain  VOR Cancellation: Comment: slow, notes some provocation of symptoms  Head-Impulse Test: unable to perform due to neck pain  Dynamic Visual Acuity: Not  able to be assessed   POSITIONAL TESTING: Right Dix-Hallpike: no nystagmus Left Dix-Hallpike: no nystagmus Right Roll Test: no nystagmus Left Roll Test: no nystagmus  MOTION SENSITIVITY:  Motion Sensitivity Quotient Intensity: 0 = none, 1 = Lightheaded, 2 = Mild, 3 = Moderate, 4 = Severe, 5 = Vomiting  Intensity  1. Sitting to supine   2. Supine to L side   3. Supine to R side   4. Supine to sitting   5. L Hallpike-Dix   6. Up from L    7. R Hallpike-Dix   8. Up from R    9. Sitting, head tipped to L knee   10. Head up from L knee   11. Sitting, head tipped to R knee   12. Head up from R knee   13. Sitting head turns x5   14.Sitting head nods x5   15. In stance, 180 turn to L    16. In stance, 180 turn to R     OTHOSTATICS: not done  FUNCTIONAL GAIT: Functional gait assessment: TBD M-CTSIB: TBD                                                                                                                            TREATMENT DATE: 04/29/23    PATIENT EDUCATION: Education details: assessment details, HEP initiation Person educated: Patient and Spouse Education method: Explanation and Handouts Education  comprehension: needs further education  HOME EXERCISE PROGRAM:  Access Code: [email or web address] URL: [email or web address] Date: 04/29/2023 Prepared by: Shary Decamp  Exercises - Cervical Extension AROM with Strap  - 1 x daily - 7 x weekly - 3 sets - 10 reps - Mid-Lower Cervical Extension SNAG with Strap  - 1 x daily - 7 x weekly - 3 sets - 10 reps - Standing Lower Cervical and Upper Thoracic Stretch  - 1 x daily - 7 x weekly - 3 sets - 10 reps - Supine Isometric Neck Extension  - 1 x daily - 7 x weekly - 1-3 sets - 10 reps - 2 sec hold  GOALS: Goals reviewed with patient? Yes  SHORT TERM GOALS: Target date: same as LTG    LONG TERM GOALS: Target date: 07/11/2023    Patient will be independent in HEP to improve functional outcomes Baseline:  Goal status: MET 05/26/23  2.  Demo improved cervical spine ROM to 35 degrees rotation to improve comfort/safety when driving Baseline: 25 degrees R/L; improved 05/26/23 Goal status: MET 05/26/23  3.  Demo score of 25/30 Functional Gait Assessment to manifest improved balance and reduced risk for falls Baseline: 26/30 05/26/23 Goal status: MET 05/26/23  4.  Pt to report neck pain not exceeding 2/10 during daily activities to improve comfort and activity tolerance Baseline: 7-8/10 with sharp pains with certain movements/activities; reports 1-2/10 on average day to day 05/26/23 Goal status: MET 05/26/23  4.  Pt to report 50% improvement in neck pain when climbing hills.  Baseline: reports  pain Goal status: INITIAL 06/13/23  4.  Pt to report 0/10 dizziness with bed mobility.  Baseline: c/o dizziness sitting up in the AM and + positional dizziness  Goal status: INITIAL 06/13/23     ASSESSMENT:  CLINICAL IMPRESSION: Patient arrived to session with report of fluctuating dizziness this week. Reviewed habituation with minor correction given. Retest of positional testing revealed remaining R BPPV. Patient treated with R Epley with  vibration without resolution, then R Universal Repositioning maneuver with improvement in symptoms, but not resolution. No complaints upon leaving.   OBJECTIVE IMPAIRMENTS: Abnormal gait, decreased activity tolerance, decreased balance, decreased ROM, dizziness, hypomobility, increased muscle spasms, and pain.   ACTIVITY LIMITATIONS: carrying, lifting, bending, reach over head, and locomotion level  PARTICIPATION LIMITATIONS: meal prep, cleaning, laundry, driving, shopping, community activity, yard work, and exercise routine  PERSONAL FACTORS: Age, Time since onset of injury/illness/exacerbation, and 1 comorbidity: PMH  are also affecting patient's functional outcome.   REHAB POTENTIAL: Excellent  CLINICAL DECISION MAKING: Evolving/moderate complexity  EVALUATION COMPLEXITY: Moderate   PLAN:  PT FREQUENCY: 1-2x/week  PT DURATION: 4 weeks  PLANNED INTERVENTIONS: 97164- PT Re-evaluation, 97110-Therapeutic exercises, 97530- Therapeutic activity, [id number]- Neuromuscular re-education, 97535- Self Care, 91478- Manual therapy, [id number]- Gait training, [id number]- Canalith repositioning, [id number]- Aquatic Therapy, [phone]- Electrical stimulation (manual), Patient/Family education, Balance training, Taping, Dry Needling, Joint mobilization, Spinal mobilization, Vestibular training, DME instructions, Cryotherapy, and Moist heat  PLAN FOR NEXT SESSION:  retest R DH and treat as needed.  Review and progress gentle neck exercises.   Baldemar Friday, PT, DPT 06/20/23 9:32 AM  [facility] at [facility] [address] [place], [address] [place], [place] [address] Phone # [phone] Fax # [phone]

## 2023-06-20 ENCOUNTER — Encounter: Payer: Self-pay | Admitting: Physical Therapy

## 2023-06-20 ENCOUNTER — Ambulatory Visit: Admitting: Physical Therapy

## 2023-06-20 DIAGNOSIS — R42 Dizziness and giddiness: Secondary | ICD-10-CM

## 2023-06-20 DIAGNOSIS — M542 Cervicalgia: Secondary | ICD-10-CM

## 2023-06-23 ENCOUNTER — Ambulatory Visit: Admitting: Physical Therapy

## 2023-06-23 ENCOUNTER — Encounter: Payer: Self-pay | Admitting: Physical Therapy

## 2023-06-23 DIAGNOSIS — R42 Dizziness and giddiness: Secondary | ICD-10-CM

## 2023-06-23 DIAGNOSIS — M542 Cervicalgia: Secondary | ICD-10-CM

## 2023-06-26 NOTE — Therapy (Signed)
 OUTPATIENT PHYSICAL THERAPY VESTIBULAR TREATMENT NOTE     Patient Name: Stephanie Aguilar MRN: [id number] DOB:10/07/1940, 83 y.o., female Today's Date: 06/30/2023      END OF SESSION:  PT End of Session - 06/30/23 0927     Visit Number 14    Number of Visits 17    Date for PT Re-Evaluation 07/11/23    Authorization Type [employer]    Authorization Time Period approved 5 PT visits from 06/27/2023-07/25/2023    PT Start Time 0848    PT Stop Time 0930    PT Time Calculation (min) 42 min    Equipment Utilized During Treatment Gait belt    Activity Tolerance Patient tolerated treatment well    Behavior During Therapy WFL for tasks assessed/performed                     Past Medical History:  Diagnosis Date   Allergy    Anxiety    Cataract    Depression    Hyperlipidemia    Hypertension    Past Surgical History:  Procedure Laterality Date   ABDOMINAL HYSTERECTOMY     APPENDECTOMY     BREAST SURGERY     DENTAL SURGERY     TONSILLECTOMY AND ADENOIDECTOMY     Patient Active Problem List   Diagnosis Date Noted   Benign paroxysmal positional vertigo due to bilateral vestibular disorder 04/30/2023   Peripheral vertigo involving left ear 04/21/2023   CKD (chronic kidney disease), stage III (HCC) 04/13/2016   PAD (peripheral artery disease) (HCC) 04/12/2016   Anxiety disorder 11/07/2015   Carotid bruit 07/06/2013   Essential hypertension 02/04/2012   Dyslipidemia 02/04/2012   Menopausal syndrome on hormone replacement therapy 02/04/2012    PCP: Tressa Busman, MD REFERRING PROVIDER: Lorin Glass, MD  REFERRING DIAG: H81.12 (ICD-10-CM) - Benign paroxysmal positional vertigo of left ear  THERAPY DIAG:  Dizziness and giddiness  Cervicalgia  ONSET DATE: 7 days ago  Rationale for Evaluation and Treatment: Rehabilitation  SUBJECTIVE:   SUBJECTIVE STATEMENT: Have been practicing my rolling. Reports 2 mornings in a row of  no dizziness. Did a 3 mile hike and did not have dizziness. Worked in the yard all day Saturday.     Pt accompanied by: self   PERTINENT HISTORY: PMH significant for HTN, HLD, anxiety/depression. Patient presented to the ED at [facility] yesterday 1/20 with complaint of dizziness with nausea upon getting up in the morning.  She has been experiencing it for the last 5 days every morning.  She feels the room spins around her for a short time.  The day prior to presentation, it happened when she was having a photo as he had to sit down.  PAIN:  Are you having pain? Yes: NPRS scale: 1/10 Pain location: L neck Pain description: sharp Aggravating factors: neck rotation, reaching Relieving factors: slow movements, avoidance  PRECAUTIONS: None  RED FLAGS: None   WEIGHT BEARING RESTRICTIONS: No  FALLS: Has patient fallen in last 6 months? No  LIVING ENVIRONMENT: Lives with: lives with their spouse Lives in: House/apartment, 2nd floor BR Stairs: exterior/interior w/ HR Has following equipment at home: None, has a shower chair and walker at her disposal  PLOF: Independent, walks for exercise, yard work  PATIENT GOALS: eliminate symptoms  OBJECTIVE:     TODAY'S TREATMENT: 06/30/23 Activity Comments  review of L rolling EO 3x /EC Cueing to bend knees and roll entire body, roll quickly. Pt tolerated well and  without dizziness   After completing activity pt reported mild sense of spinning- small amplitude R upbeating torsional nystagmus evident which resolved   L rolling EC 3x C/o mild dizziness on 1st and 3rd rep. C/o wooziness upon sitting up on EOM  review of sitting anterior canal habituation EO (however patient reports she was performing with EC) 3x each Good tolerance, good speed   Standing on foam: EC 30" EO head turns 30" EO head nods30" EO 30" Romberg EC 2x30" Moderate sway with last condition initially; improved to mild sway after cues to tighten core and redirect wt  shift anteriorly     PATIENT EDUCATION: Education details: discussion on remainder of POC, encouraged return to HEP in the case of a flare up Person educated: Patient Education method: Explanation, Demonstration, Tactile cues, and Verbal cues Education comprehension: verbalized understanding      HOME EXERCISE PROGRAM Last updated: 06/23/23 Access Code: [email or web address] URL: [email or web address] Date: 06/23/2023 Prepared by: [facility] Outpatient  Rehab - [facility]  Exercises - Cervical Extension AROM with Strap  - 1 x daily - 7 x weekly - 3 sets - 10 reps - Mid-Lower Cervical Extension SNAG with Strap  - 1 x daily - 7 x weekly - 3 sets - 10 reps - Standing Lower Cervical and Upper Thoracic Stretch  - 1 x daily - 7 x weekly - 3 sets - 10 reps - Supine Isometric Neck Extension  - 1 x daily - 7 x weekly - 1-3 sets - 10 reps - 2 sec hold - Seated Assisted Cervical Rotation with Towel  - 1-2 x daily - 7 x weekly - 1 sets - 3-5 reps - 15-30 sec hold - Brandt-Daroff Vestibular Exercise (Mirrored)  - 1 x daily - 5 x weekly - 2 sets - 3-5 reps - Seated Nose to Left Knee Vestibular Habituation  - 1 x daily - 5 x weekly - 1-2 sets - 3 reps - 10 sec  hold - Seated Nose to Left Knee Vestibular Habituation  - 1 x daily - 5 x weekly - 1-2 sets - 3 reps - 10 sec  hold    -------------------------------------------------------------- Note: Objective measures were completed at Evaluation unless otherwise noted.  DIAGNOSTIC FINDINGS:   COGNITION: Overall cognitive status: Within functional limits for tasks assessed   SENSATION: WFL  EDEMA:    PALPATION:  tenderness and trigger points appreciated along medial scapular border R > L   DTRs:  NT  POSTURE:  No Significant postural limitations  Cervical ROM:    Active A/PROM (deg) eval  Flexion 60  Extension 20  Right lateral flexion 15  Left lateral flexion 15  Right rotation 25  Left rotation 25  (Blank rows =  not tested)  STRENGTH:   LOWER EXTREMITY MMT:   MMT Right eval Left eval  Hip flexion    Hip abduction    Hip adduction    Hip internal rotation    Hip external rotation    Knee flexion    Knee extension    Ankle dorsiflexion    Ankle plantarflexion    Ankle inversion    Ankle eversion    (Blank rows = not tested)  BED MOBILITY:  indep  TRANSFERS: Indep    GAIT: Gait pattern:  decreased speed due to guarding from symptoms Distance walked:  Assistive device utilized: None Level of assistance: Complete Independence Comments:     VESTIBULAR ASSESSMENT:  GENERAL OBSERVATION: wears progressive lens glasses  SYMPTOM BEHAVIOR:  Subjective history: 7 day hx of positional vertigo  Non-Vestibular symptoms: neck pain  Type of dizziness: Spinning/Vertigo  Frequency: daily  Duration: seconds-minutes   Aggravating factors: Induced by position change: lying supine, rolling to the left, and supine to sit  Relieving factors: slow movements  Progression of symptoms: better  OCULOMOTOR EXAM:  Ocular Alignment: normal  Ocular ROM: No Limitations  Spontaneous Nystagmus: absent  Gaze-Induced Nystagmus: absent  Smooth Pursuits: intact  Saccades: slow  Convergence/Divergence:  cm    VESTIBULAR - OCULAR REFLEX:   Slow VOR: Comment: limited ROM due to neck pain  VOR Cancellation: Comment: slow, notes some provocation of symptoms  Head-Impulse Test: unable to perform due to neck pain  Dynamic Visual Acuity: Not able to be assessed   POSITIONAL TESTING: Right Dix-Hallpike: no nystagmus Left Dix-Hallpike: no nystagmus Right Roll Test: no nystagmus Left Roll Test: no nystagmus  MOTION SENSITIVITY:  Motion Sensitivity Quotient Intensity: 0 = none, 1 = Lightheaded, 2 = Mild, 3 = Moderate, 4 = Severe, 5 = Vomiting  Intensity  1. Sitting to supine   2. Supine to L side   3. Supine to R side   4. Supine to sitting   5. L Hallpike-Dix   6. Up from L    7. R  Hallpike-Dix   8. Up from R    9. Sitting, head tipped to L knee   10. Head up from L knee   11. Sitting, head tipped to R knee   12. Head up from R knee   13. Sitting head turns x5   14.Sitting head nods x5   15. In stance, 180 turn to L    16. In stance, 180 turn to R     OTHOSTATICS: not done  FUNCTIONAL GAIT: Functional gait assessment: TBD M-CTSIB: TBD                                                                                                                            TREATMENT DATE: 04/29/23    PATIENT EDUCATION: Education details: assessment details, HEP initiation Person educated: Patient and Spouse Education method: Explanation and Handouts Education comprehension: needs further education  HOME EXERCISE PROGRAM:  Access Code: [email or web address] URL: [email or web address] Date: 04/29/2023 Prepared by: Shary Decamp  Exercises - Cervical Extension AROM with Strap  - 1 x daily - 7 x weekly - 3 sets - 10 reps - Mid-Lower Cervical Extension SNAG with Strap  - 1 x daily - 7 x weekly - 3 sets - 10 reps - Standing Lower Cervical and Upper Thoracic Stretch  - 1 x daily - 7 x weekly - 3 sets - 10 reps - Supine Isometric Neck Extension  - 1 x daily - 7 x weekly - 1-3 sets - 10 reps - 2 sec hold  GOALS: Goals reviewed with patient? Yes  SHORT TERM GOALS: Target date: same as LTG    LONG TERM GOALS: Target date: 07/11/2023  Patient will be independent in HEP to improve functional outcomes Baseline:  Goal status: MET 05/26/23  2.  Demo improved cervical spine ROM to 35 degrees rotation to improve comfort/safety when driving Baseline: 25 degrees R/L; improved 05/26/23 Goal status: MET 05/26/23  3.  Demo score of 25/30 Functional Gait Assessment to manifest improved balance and reduced risk for falls Baseline: 26/30 05/26/23 Goal status: MET 05/26/23  4.  Pt to report neck pain not exceeding 2/10 during daily activities to improve comfort and activity  tolerance Baseline: 7-8/10 with sharp pains with certain movements/activities; reports 1-2/10 on average day to day 05/26/23 Goal status: MET 05/26/23  4.  Pt to report 50% improvement in neck pain when climbing hills.  Baseline: reports pain Goal status: INITIAL 06/13/23  4.  Pt to report 0/10 dizziness with bed mobility.  Baseline: c/o dizziness sitting up in the AM and + positional dizziness  Goal status: INITIAL 06/13/23     ASSESSMENT:  CLINICAL IMPRESSION: Patient arrived to session with  report of return to hiking and yardwork without symptoms. Reports less dizziness in the mornings recently. Reviewed habituation from HEP for max carryover and progressed with EC. Patient reported no dizziness with EO rolling initially, however upon lying supine after this activity she reported sense of mild spinning and nystagmus indicating remaining R posterior canalithiasis was evident. Able to proceed with habituation without remaining symptoms. No complaints at end of session.   OBJECTIVE IMPAIRMENTS: Abnormal gait, decreased activity tolerance, decreased balance, decreased ROM, dizziness, hypomobility, increased muscle spasms, and pain.   ACTIVITY LIMITATIONS: carrying, lifting, bending, reach over head, and locomotion level  PARTICIPATION LIMITATIONS: meal prep, cleaning, laundry, driving, shopping, community activity, yard work, and exercise routine  PERSONAL FACTORS: Age, Time since onset of injury/illness/exacerbation, and 1 comorbidity: PMH  are also affecting patient's functional outcome.   REHAB POTENTIAL: Excellent  CLINICAL DECISION MAKING: Evolving/moderate complexity  EVALUATION COMPLEXITY: Moderate   PLAN:  PT FREQUENCY: 1-2x/week  PT DURATION: 4 weeks  PLANNED INTERVENTIONS: 97164- PT Re-evaluation, 97110-Therapeutic exercises, 97530- Therapeutic activity, [id number]- Neuromuscular re-education, 97535- Self Care, [id number]- Manual therapy, [id number]- Gait training, [phone]- Canalith  repositioning, [id number]- Aquatic Therapy, [phone]- Electrical stimulation (manual), Patient/Family education, Balance training, Taping, Dry Needling, Joint mobilization, Spinal mobilization, Vestibular training, DME instructions, Cryotherapy, and Moist heat  PLAN FOR NEXT SESSION:  work on lying flat > sitting up habituation; retest R DH and treat as needed.  Continue habituation. Review and progress gentle neck exercises.   Baldemar Friday, PT, DPT 06/30/23 9:35 AM  [facility] [address] [place], [address] [place], [place] [address] Phone # [phone] Fax # [phone]

## 2023-06-27 ENCOUNTER — Encounter: Payer: Self-pay | Admitting: Physical Therapy

## 2023-06-27 ENCOUNTER — Ambulatory Visit: Admitting: Physical Therapy

## 2023-06-27 DIAGNOSIS — R42 Dizziness and giddiness: Secondary | ICD-10-CM

## 2023-06-27 DIAGNOSIS — M542 Cervicalgia: Secondary | ICD-10-CM

## 2023-06-27 NOTE — Therapy (Signed)
 OUTPATIENT PHYSICAL THERAPY VESTIBULAR TREATMENT NOTE     Patient Name: Stephanie Aguilar MRN: [id number] DOB:[birth year], 83 y.o., female Today's Date: 06/27/2023      END OF SESSION:  PT End of Session - 06/27/23 0846     Visit Number 13    Number of Visits 17    Date for PT Re-Evaluation 07/11/23    Authorization Type [employer]    Authorization Time Period approved 5 PT visits from 06/27/2023-07/25/2023    PT Start Time 0847    PT Stop Time 0930    PT Time Calculation (min) 43 min    Activity Tolerance Patient tolerated treatment well    Behavior During Therapy WFL for tasks assessed/performed                     Past Medical History:  Diagnosis Date   Allergy    Anxiety    Cataract    Depression    Hyperlipidemia    Hypertension    Past Surgical History:  Procedure Laterality Date   ABDOMINAL HYSTERECTOMY     APPENDECTOMY     BREAST SURGERY     DENTAL SURGERY     TONSILLECTOMY AND ADENOIDECTOMY     Patient Active Problem List   Diagnosis Date Noted   Benign paroxysmal positional vertigo due to bilateral vestibular disorder 04/30/2023   Peripheral vertigo involving left ear 04/21/2023   CKD (chronic kidney disease), stage III (HCC) 04/13/2016   PAD (peripheral artery disease) (HCC) 04/12/2016   Anxiety disorder 11/07/2015   Carotid bruit 07/06/2013   Essential hypertension 02/04/2012   Dyslipidemia 02/04/2012   Menopausal syndrome on hormone replacement therapy 02/04/2012    PCP: Tressa Busman, MD REFERRING PROVIDER: Lorin Glass, MD  REFERRING DIAG: H81.12 (ICD-10-CM) - Benign paroxysmal positional vertigo of left ear  THERAPY DIAG:  Dizziness and giddiness  Cervicalgia  ONSET DATE: 7 days ago  Rationale for Evaluation and Treatment: Rehabilitation  SUBJECTIVE:   SUBJECTIVE STATEMENT: Sciatica has flared up this morning for some reason.  I was great after last visit for several days.  Wednesday  and this morning, I tried to get up and have felt "bumbly" with getting up.     Pt accompanied by: self   PERTINENT HISTORY: PMH significant for HTN, HLD, anxiety/depression. Patient presented to the ED at [facility] yesterday 1/20 with complaint of dizziness with nausea upon getting up in the morning.  She has been experiencing it for the last 5 days every morning.  She feels the room spins around her for a short time.  The day prior to presentation, it happened when she was having a photo as he had to sit down.  PAIN:  Are you having pain? Yes: NPRS scale: 0/10 Pain location: L neck, upper back, shoulders Pain description: sharp Aggravating factors: neck rotation, reaching Relieving factors: slow movements, avoidance  Sciatic nerve pain rates 5/10 today.  Longstanding hx of this and it will resolve.  PRECAUTIONS: None  RED FLAGS: None   WEIGHT BEARING RESTRICTIONS: No  FALLS: Has patient fallen in last 6 months? No  LIVING ENVIRONMENT: Lives with: lives with their spouse Lives in: House/apartment, 2nd floor BR Stairs: exterior/interior w/ HR Has following equipment at home: None, has a shower chair and walker at her disposal  PLOF: Independent, walks for exercise, yard work  PATIENT GOALS: eliminate symptoms  OBJECTIVE:   Rates baseline symptoms of 0/10   TODAY'S TREATMENT: 06/27/2023 Activity Comments  Review  of HEP Good return demo  R DH Negative, no nystagmus, no diplopia  L DH Negative, c/o diplopia  R roll Negative  L roll Negative, but roll back to supine, pt notes spinning as 4-5/10  Repeated L roll<>supine for habituation 2nd rep-1/10 3rd rep 0/10  Supine>sit C/o mild wooziness upon sitting up       M-CTSIB  Condition 1: Firm Surface, EO 30 Sec, Normal Sway  Condition 2: Firm Surface, EC 30 Sec, Normal Sway  Condition 3: Foam Surface, EO 30 Sec, Mild Sway  Condition 4: Foam Surface, EC 12.34  Sec, Severe Sway     Access Code: [email or web address] URL:  [email or web address] Date: 06/27/2023 Prepared by: [facility] - Outpatient  Rehab - [facility]  Exercises - Cervical Extension AROM with Strap  - 1 x daily - 7 x weekly - 3 sets - 10 reps - Mid-Lower Cervical Extension SNAG with Strap  - 1 x daily - 7 x weekly - 3 sets - 10 reps - Standing Lower Cervical and Upper Thoracic Stretch  - 1 x daily - 7 x weekly - 3 sets - 10 reps - Supine Isometric Neck Extension  - 1 x daily - 7 x weekly - 1-3 sets - 10 reps - 2 sec hold - Seated Assisted Cervical Rotation with Towel  - 1-2 x daily - 7 x weekly - 1 sets - 3-5 reps - 15-30 sec hold - Brandt-Daroff Vestibular Exercise (Mirrored)  - 1 x daily - 5 x weekly - 2 sets - 3-5 reps - Seated Nose to Left Knee Vestibular Habituation  - 1 x daily - 5 x weekly - 1-2 sets - 3 reps - 10 sec  hold - Seated Nose to Left Knee Vestibular Habituation  - 1 x daily - 5 x weekly - 1-2 sets - 3 reps - 10 sec  hold - Supine to Left Sidelying Vestibular Habituation  - 1-2 x daily - 7 x weekly - 1 sets - 3 reps    PATIENT EDUCATION: Education details: HEP update, answered pt's questions and discussed rolling for habituation  Person educated: Patient Education method: Explanation, Demonstration, Tactile cues, Verbal cues, and Handouts Education comprehension: verbalized understanding and returned demonstration   -------------------------------------------------------------- Note: Objective measures were completed at Evaluation unless otherwise noted.  DIAGNOSTIC FINDINGS:   COGNITION: Overall cognitive status: Within functional limits for tasks assessed   SENSATION: WFL  EDEMA:    PALPATION:  tenderness and trigger points appreciated along medial scapular border R > L   DTRs:  NT  POSTURE:  No Significant postural limitations  Cervical ROM:    Active A/PROM (deg) eval  Flexion 60  Extension 20  Right lateral flexion 15  Left lateral flexion 15  Right rotation 25  Left  rotation 25  (Blank rows = not tested)  STRENGTH:   LOWER EXTREMITY MMT:   MMT Right eval Left eval  Hip flexion    Hip abduction    Hip adduction    Hip internal rotation    Hip external rotation    Knee flexion    Knee extension    Ankle dorsiflexion    Ankle plantarflexion    Ankle inversion    Ankle eversion    (Blank rows = not tested)  BED MOBILITY:  indep  TRANSFERS: Indep    GAIT: Gait pattern:  decreased speed due to guarding from symptoms Distance walked:  Assistive device utilized: None Level of assistance: Complete Independence Comments:  VESTIBULAR ASSESSMENT:  GENERAL OBSERVATION: wears progressive lens glasses   SYMPTOM BEHAVIOR:  Subjective history: 7 day hx of positional vertigo  Non-Vestibular symptoms: neck pain  Type of dizziness: Spinning/Vertigo  Frequency: daily  Duration: seconds-minutes   Aggravating factors: Induced by position change: lying supine, rolling to the left, and supine to sit  Relieving factors: slow movements  Progression of symptoms: better  OCULOMOTOR EXAM:  Ocular Alignment: normal  Ocular ROM: No Limitations  Spontaneous Nystagmus: absent  Gaze-Induced Nystagmus: absent  Smooth Pursuits: intact  Saccades: slow  Convergence/Divergence:  cm    VESTIBULAR - OCULAR REFLEX:   Slow VOR: Comment: limited ROM due to neck pain  VOR Cancellation: Comment: slow, notes some provocation of symptoms  Head-Impulse Test: unable to perform due to neck pain  Dynamic Visual Acuity: Not able to be assessed   POSITIONAL TESTING: Right Dix-Hallpike: no nystagmus Left Dix-Hallpike: no nystagmus Right Roll Test: no nystagmus Left Roll Test: no nystagmus  MOTION SENSITIVITY:  Motion Sensitivity Quotient Intensity: 0 = none, 1 = Lightheaded, 2 = Mild, 3 = Moderate, 4 = Severe, 5 = Vomiting  Intensity  1. Sitting to supine   2. Supine to L side   3. Supine to R side   4. Supine to sitting   5. L Hallpike-Dix   6.  Up from L    7. R Hallpike-Dix   8. Up from R    9. Sitting, head tipped to L knee   10. Head up from L knee   11. Sitting, head tipped to R knee   12. Head up from R knee   13. Sitting head turns x5   14.Sitting head nods x5   15. In stance, 180 turn to L    16. In stance, 180 turn to R     OTHOSTATICS: not done  FUNCTIONAL GAIT: Functional gait assessment: TBD M-CTSIB: TBD                                                                                                                            TREATMENT DATE: 04/29/23    PATIENT EDUCATION: Education details: assessment details, HEP initiation Person educated: Patient and Spouse Education method: Explanation and Handouts Education comprehension: needs further education  HOME EXERCISE PROGRAM:  Access Code: [email or web address] URL: [email or web address] Date: 04/29/2023 Prepared by: Shary Decamp  Exercises - Cervical Extension AROM with Strap  - 1 x daily - 7 x weekly - 3 sets - 10 reps - Mid-Lower Cervical Extension SNAG with Strap  - 1 x daily - 7 x weekly - 3 sets - 10 reps - Standing Lower Cervical and Upper Thoracic Stretch  - 1 x daily - 7 x weekly - 3 sets - 10 reps - Supine Isometric Neck Extension  - 1 x daily - 7 x weekly - 1-3 sets - 10 reps - 2 sec hold  GOALS: Goals reviewed with patient? Yes  SHORT TERM GOALS: Target date: same  as LTG    LONG TERM GOALS: Target date: 07/11/2023    Patient will be independent in HEP to improve functional outcomes Baseline:  Goal status: MET 05/26/23  2.  Demo improved cervical spine ROM to 35 degrees rotation to improve comfort/safety when driving Baseline: 25 degrees R/L; improved 05/26/23 Goal status: MET 05/26/23  3.  Demo score of 25/30 Functional Gait Assessment to manifest improved balance and reduced risk for falls Baseline: 26/30 05/26/23 Goal status: MET 05/26/23  4.  Pt to report neck pain not exceeding 2/10 during daily activities to improve comfort  and activity tolerance Baseline: 7-8/10 with sharp pains with certain movements/activities; reports 1-2/10 on average day to day 05/26/23 Goal status: MET 05/26/23  4.  Pt to report 50% improvement in neck pain when climbing hills.  Baseline: reports pain Goal status: INITIAL 06/13/23  4.  Pt to report 0/10 dizziness with bed mobility.  Baseline: c/o dizziness sitting up in the AM and + positional dizziness  Goal status: INITIAL 06/13/23     ASSESSMENT:  CLINICAL IMPRESSION: Pt presents today with reports of continued improvement, but several days of feeling "bumbly". Skilled PT session focused on reassessment of positional vertigo, with no nystagmus noted in positions for posterior and horizontal canals.  She reports dizziness with rolling to L and admits she avoids this movement at home; she also reports dizziness upon sitting up.  Added supine>Left rolling for habituation, as with repeated motion of this motion today, her symptoms subside.  Of note, also looked at MCTSIB, and pt has increased sway and is unable to maintain balance on Condition 4.  She will continue to benefit from skilled PT towards goals for improved functional mobility and decreased dizziness.  OBJECTIVE IMPAIRMENTS: Abnormal gait, decreased activity tolerance, decreased balance, decreased ROM, dizziness, hypomobility, increased muscle spasms, and pain.   ACTIVITY LIMITATIONS: carrying, lifting, bending, reach over head, and locomotion level  PARTICIPATION LIMITATIONS: meal prep, cleaning, laundry, driving, shopping, community activity, yard work, and exercise routine  PERSONAL FACTORS: Age, Time since onset of injury/illness/exacerbation, and 1 comorbidity: PMH  are also affecting patient's functional outcome.   REHAB POTENTIAL: Excellent  CLINICAL DECISION MAKING: Evolving/moderate complexity  EVALUATION COMPLEXITY: Moderate   PLAN:  PT FREQUENCY: 1-2x/week  PT DURATION: 4 weeks  PLANNED INTERVENTIONS:  97164- PT Re-evaluation, 97110-Therapeutic exercises, 97530- Therapeutic activity, [id number]- Neuromuscular re-education, 97535- Self Care, 54098- Manual therapy, [id number]- Gait training, [phone]- Canalith repositioning, [id number]- Aquatic Therapy, [phone]- Electrical stimulation (manual), Patient/Family education, Balance training, Taping, Dry Needling, Joint mobilization, Spinal mobilization, Vestibular training, DME instructions, Cryotherapy, and Moist heat  PLAN FOR NEXT SESSION:  Retest R DH/positional vertigo and treat as needed.  Continue habituation. Review and progress gentle neck exercises.  Consider vestibular system balance exercises-corner/compliant surfaces.   Lonia Blood, PT 06/27/23 9:14 PM Phone: [phone] Fax: [phone]  [facility] at [facility], [facility] [address] [place], [place] [address] Phone # [phone] Fax # [phone]   Referring diagnosis?  H81.12 (ICD-10-CM) - Benign paroxysmal positional vertigo of left ear Treatment diagnosis? (if different than referring diagnosis)  Dizziness and giddiness Cervicalgia  What was this (referring dx) caused by? []  Surgery []  Fall []  Ongoing issue []  Arthritis [x]  Other: insidious  Laterality: [x]  Rt []  Lt []  Both  Check all possible CPT codes:  *CHOOSE 10 OR LESS*    See Planned Interventions listed in the Plan section of the Evaluation.

## 2023-06-30 ENCOUNTER — Encounter: Payer: Self-pay | Admitting: Physical Therapy

## 2023-06-30 ENCOUNTER — Ambulatory Visit: Admitting: Physical Therapy

## 2023-06-30 DIAGNOSIS — M542 Cervicalgia: Secondary | ICD-10-CM

## 2023-06-30 DIAGNOSIS — R42 Dizziness and giddiness: Secondary | ICD-10-CM | POA: Diagnosis not present

## 2023-07-01 ENCOUNTER — Encounter: Payer: Self-pay | Admitting: Emergency Medicine

## 2023-07-01 ENCOUNTER — Ambulatory Visit: Payer: Medicare Other | Admitting: Emergency Medicine

## 2023-07-01 VITALS — BP 150/78 | HR 53 | Temp 97.6°F | Ht 59.0 in | Wt 103.0 lb

## 2023-07-01 DIAGNOSIS — F411 Generalized anxiety disorder: Secondary | ICD-10-CM

## 2023-07-01 DIAGNOSIS — I10 Essential (primary) hypertension: Secondary | ICD-10-CM

## 2023-07-01 DIAGNOSIS — I739 Peripheral vascular disease, unspecified: Secondary | ICD-10-CM | POA: Diagnosis not present

## 2023-07-01 DIAGNOSIS — N1831 Chronic kidney disease, stage 3a: Secondary | ICD-10-CM

## 2023-07-01 DIAGNOSIS — E785 Hyperlipidemia, unspecified: Secondary | ICD-10-CM

## 2023-07-01 LAB — LIPID PANEL
Cholesterol: 119 mg/dL (ref 0–200)
HDL: 59.9 mg/dL (ref >39.00)
LDL Cholesterol: 39 mg/dL (ref 0–99)
NonHDL: 59.5
Total CHOL/HDL Ratio: 2
Triglycerides: 103 mg/dL (ref 0.0–149.0)
VLDL: 20.6 mg/dL (ref 0.0–40.0)

## 2023-07-01 LAB — CBC WITH DIFFERENTIAL/PLATELET
Basophils Absolute: 0.1 10*3/uL (ref 0.0–0.1)
Basophils Relative: 1.1 % (ref 0.0–3.0)
Eosinophils Absolute: 0.3 10*3/uL (ref 0.0–0.7)
Eosinophils Relative: 3.9 % (ref 0.0–5.0)
HCT: 43.9 % (ref 36.0–46.0)
Hemoglobin: 14.6 g/dL (ref 12.0–15.0)
Lymphocytes Relative: 19.2 % (ref 12.0–46.0)
Lymphs Abs: 1.4 10*3/uL (ref 0.7–4.0)
MCHC: 33.4 g/dL (ref 30.0–36.0)
MCV: 92.1 fl (ref 78.0–100.0)
Monocytes Absolute: 0.7 10*3/uL (ref 0.1–1.0)
Monocytes Relative: 9.4 % (ref 3.0–12.0)
Neutro Abs: 4.8 10*3/uL (ref 1.4–7.7)
Neutrophils Relative %: 66.4 % (ref 43.0–77.0)
Platelets: 189 10*3/uL (ref 150.0–400.0)
RBC: 4.76 Mil/uL (ref 3.87–5.11)
RDW: 13.9 % (ref 11.5–15.5)
WBC: 7.2 10*3/uL (ref 4.0–10.5)

## 2023-07-01 LAB — COMPREHENSIVE METABOLIC PANEL WITH GFR
ALT: 11 U/L (ref 0–35)
AST: 17 U/L (ref 0–37)
Albumin: 4.2 g/dL (ref 3.5–5.2)
Alkaline Phosphatase: 71 U/L (ref 39–117)
BUN: 21 mg/dL (ref 6–23)
CO2: 30 meq/L (ref 19–32)
Calcium: 9.8 mg/dL (ref 8.4–10.5)
Chloride: 105 meq/L (ref 96–112)
Creatinine, Ser: 1.05 mg/dL (ref 0.40–1.20)
GFR: 49.62 mL/min — ABNORMAL LOW (ref >60.00)
Glucose, Bld: 94 mg/dL (ref 70–99)
Potassium: 4.6 meq/L (ref 3.5–5.1)
Sodium: 141 meq/L (ref 135–145)
Total Bilirubin: 0.7 mg/dL (ref 0.2–1.2)
Total Protein: 7.2 g/dL (ref 6.0–8.3)

## 2023-07-01 LAB — MICROALBUMIN / CREATININE URINE RATIO
Creatinine,U: 39.6 mg/dL
Microalb Creat Ratio: UNDETERMINED mg/g (ref 0.0–30.0)
Microalb, Ur: <0.7 mg/dL

## 2023-07-01 LAB — HEMOGLOBIN A1C: Hgb A1c MFr Bld: 5.8 % (ref 4.6–6.5)

## 2023-07-01 MED ORDER — LORAZEPAM 0.5 MG PO TABS: 0.5000 mg | ORAL_TABLET | Freq: Two times a day (BID) | ORAL | 1 refills | Status: DC | PRN

## 2023-07-01 NOTE — Assessment & Plan Note (Signed)
 Stable and well-controlled Continues lorazepam 0.5 mg daily as needed

## 2023-07-01 NOTE — Assessment & Plan Note (Signed)
 Stable chronic condition Continues low-dose rosuvastatin 2.5 mg daily Higher doses gave her musculoskeletal side effects

## 2023-07-01 NOTE — Assessment & Plan Note (Signed)
 BP Readings from Last 3 Encounters:  07/01/23 (!) 150/78  04/30/23 134/78  04/23/23 (!) 136/51  Normal blood pressure readings at home Well-controlled hypertension off olmesartan. Continue amlodipine 2.5 mg daily.  Continue Sectral 200 mg daily. Cardiovascular risks associated with hypertension discussed Diet and nutrition discussed

## 2023-07-01 NOTE — Assessment & Plan Note (Signed)
Stable chronic condition Continues daily baby aspirin

## 2023-07-01 NOTE — Patient Instructions (Signed)
 Health Maintenance After Age 82 After age 4, you are at a higher risk for certain long-term diseases and infections as well as injuries from falls. Falls are a major cause of broken bones and head injuries in people who are older than age 47. Getting regular preventive care can help to keep you healthy and well. Preventive care includes getting regular testing and making lifestyle changes as recommended by your health care provider. Talk with your health care provider about: Which screenings and tests you should have. A screening is a test that checks for a disease when you have no symptoms. A diet and exercise plan that is right for you. What should I know about screenings and tests to prevent falls? Screening and testing are the best ways to find a health problem early. Early diagnosis and treatment give you the best chance of managing medical conditions that are common after age 37. Certain conditions and lifestyle choices may make you more likely to have a fall. Your health care provider may recommend: Regular vision checks. Poor vision and conditions such as cataracts can make you more likely to have a fall. If you wear glasses, make sure to get your prescription updated if your vision changes. Medicine review. Work with your health care provider to regularly review all of the medicines you are taking, including over-the-counter medicines. Ask your health care provider about any side effects that may make you more likely to have a fall. Tell your health care provider if any medicines that you take make you feel dizzy or sleepy. Strength and balance checks. Your health care provider may recommend certain tests to check your strength and balance while standing, walking, or changing positions. Foot health exam. Foot pain and numbness, as well as not wearing proper footwear, can make you more likely to have a fall. Screenings, including: Osteoporosis screening. Osteoporosis is a condition that causes  the bones to get weaker and break more easily. Blood pressure screening. Blood pressure changes and medicines to control blood pressure can make you feel dizzy. Depression screening. You may be more likely to have a fall if you have a fear of falling, feel depressed, or feel unable to do activities that you used to do. Alcohol use screening. Using too much alcohol can affect your balance and may make you more likely to have a fall. Follow these instructions at home: Lifestyle Do not drink alcohol if: Your health care provider tells you not to drink. If you drink alcohol: Limit how much you have to: 0-1 drink a day for women. 0-2 drinks a day for men. Know how much alcohol is in your drink. In the [place] one drink equals one 12 oz bottle of beer (355 mL), one 5 oz glass of wine (148 mL), or one 1 oz glass of hard liquor (44 mL). Do not use any products that contain nicotine or tobacco. These products include cigarettes, chewing tobacco, and vaping devices, such as e-cigarettes. If you need help quitting, ask your health care provider. Activity  Follow a regular exercise program to stay fit. This will help you maintain your balance. Ask your health care provider what types of exercise are appropriate for you. If you need a cane or walker, use it as recommended by your health care provider. Wear supportive shoes that have nonskid soles. Safety  Remove any tripping hazards, such as rugs, cords, and clutter. Install safety equipment such as grab bars in bathrooms and safety rails on stairs. Keep rooms and walkways  well-lit. General instructions Talk with your health care provider about your risks for falling. Tell your health care provider if: You fall. Be sure to tell your health care provider about all falls, even ones that seem minor. You feel dizzy, tiredness (fatigue), or off-balance. Take over-the-counter and prescription medicines only as told by your health care provider. These include  supplements. Eat a healthy diet and maintain a healthy weight. A healthy diet includes low-fat dairy products, low-fat (lean) meats, and fiber from whole grains, beans, and lots of fruits and vegetables. Stay current with your vaccines. Schedule regular health, dental, and eye exams. Summary Having a healthy lifestyle and getting preventive care can help to protect your health and wellness after age 11. Screening and testing are the best way to find a health problem early and help you avoid having a fall. Early diagnosis and treatment give you the best chance for managing medical conditions that are more common for people who are older than age 28. Falls are a major cause of broken bones and head injuries in people who are older than age 48. Take precautions to prevent a fall at home. Work with your health care provider to learn what changes you can make to improve your health and wellness and to prevent falls. This information is not intended to replace advice given to you by your health care provider. Make sure you discuss any questions you have with your health care provider. Document Revised: 08/07/2020 Document Reviewed: 08/07/2020 Elsevier Patient Education  2024 [employer].

## 2023-07-01 NOTE — Progress Notes (Signed)
 Stephanie Aguilar 83 y.o.   Chief Complaint  Patient presents with   Follow-up    6 month f/u. Medication questions also need refill for lorazepam 0.5mg      HISTORY OF PRESENT ILLNESS: This is a 83 y.o. female here for 77-month follow-up of chronic medical conditions including hypertension. Overall doing well.  Has no complaints or medical concerns today  BP Readings from Last 3 Encounters:  04/30/23 134/78  04/23/23 (!) 136/51  12/31/22 132/78   Wt Readings from Last 3 Encounters:  07/01/23 103 lb (46.7 kg)  04/30/23 101 lb (45.8 kg)  04/22/23 103 lb 6.3 oz (46.9 kg)     HPI   Prior to Admission medications   Medication Sig Start Date End Date Taking? Authorizing Provider  acebutolol (SECTRAL) 200 MG capsule Take 1 capsule (200 mg total) by mouth daily. 12/31/22 12/26/23  Georgina Quint, MD  amLODipine (NORVASC) 2.5 MG tablet TAKE 1 TABLET EACH DAY. 12/31/22   Georgina Quint, MD  aspirin 81 MG tablet Take 81 mg by mouth daily.    [provider]  carboxymethylcellulose (REFRESH PLUS) 0.5 % SOLN Place 2 drops into both eyes 3 (three) times daily as needed.    [provider]  Cholecalciferol (VITAMIN D) 2000 units CAPS Take 1 capsule by mouth daily.    [provider]  clobetasol (TEMOVATE) 0.05 % external solution Apply 10 drops to scalp every night at bedtime. 04/11/15   Collene Gobble, MD  cycloSPORINE (RESTASIS) 0.05 % ophthalmic emulsion 1 drop 2 (two) times daily.    [provider]  desloratadine (CLARINEX) 5 MG tablet Take 1 tablet (5 mg total) by mouth daily. as directed 12/31/22   Georgina Quint, MD  DOTTI 0.025 MG/24HR Place 1 patch onto the skin 2 (two) times a week. 04/14/23   [provider]  fluticasone (FLONASE) 50 MCG/ACT nasal spray USE 2 SPRAYS INTO EACH NOSTRIL DAILY 09/20/14   Collene Gobble, MD  LORazepam (ATIVAN) 0.5 MG tablet Take 1 tablet (0.5 mg total) by mouth 2 (two) times daily as  needed for anxiety. 12/31/22   Georgina Quint, MD  meclizine (ANTIVERT) 12.5 MG tablet Take 1 tablet (12.5 mg total) by mouth 3 (three) times daily as needed for dizziness. 04/30/23   Georgina Quint, MD  METRONIDAZOLE, TOPICAL, 0.75 % LOTN APPLY TOPICALLY TO AFFECTED AREA. 09/21/15   Collene Gobble, MD  olmesartan (BENICAR) 40 MG tablet Take 1 tablet (40 mg total) by mouth daily. Patient not taking: Reported on 04/30/2023 12/31/22   Georgina Quint, MD  ondansetron (ZOFRAN-ODT) 4 MG disintegrating tablet Take 1 tablet (4 mg total) by mouth every 8 (eight) hours as needed for nausea or vomiting. Patient not taking: Reported on 04/30/2023 04/21/23   Loetta Rough, MD  OVER THE COUNTER MEDICATION OTC Imodium taking daily for diarrrhea    [provider]  rosuvastatin (CRESTOR) 5 MG tablet Take 0.5 tablets (2.5 mg total) by mouth daily. 12/31/22 12/26/23  Georgina Quint, MD    Allergies  Allergen Reactions   Lactose Intolerance (Gi) Diarrhea   Codeine Nausea Only   Erythromycin Nausea Only    Sick on stomach   Lisinopril Cough   Sulfa Antibiotics     rash   Latex Rash    Patient Active Problem List   Diagnosis Date Noted   CKD (chronic kidney disease), stage III (HCC) 04/13/2016   PAD (peripheral artery disease) (HCC) 04/12/2016  Anxiety disorder 11/07/2015   Carotid bruit 07/06/2013   Essential hypertension 02/04/2012   Dyslipidemia 02/04/2012   Menopausal syndrome on hormone replacement therapy 02/04/2012    Past Medical History:  Diagnosis Date   Allergy    Anxiety    Cataract    Depression    Hyperlipidemia    Hypertension     Past Surgical History:  Procedure Laterality Date   ABDOMINAL HYSTERECTOMY     APPENDECTOMY     BREAST SURGERY     DENTAL SURGERY     TONSILLECTOMY AND ADENOIDECTOMY      Social History   Socioeconomic History   Marital status: Married    Spouse name: Not on file   Number of children: Not on file   Years  of education: Not on file   Highest education level: Bachelor's degree (e.g., BA, AB, BS)  Occupational History   Occupation: retired/homemaker  Tobacco Use   Smoking status: Never   Smokeless tobacco: Never  Substance and Sexual Activity   Alcohol use: Yes    Comment: 1-2 oz weekly   Drug use: No   Sexual activity: Never  Other Topics Concern   Not on file  Social History Narrative   Not on file   Social Drivers of Health   Financial Resource Strain: Low Risk  (06/28/2023)   Overall Financial Resource Strain (CARDIA)    Difficulty of Paying Living Expenses: Not hard at all  Food Insecurity: No Food Insecurity (06/28/2023)   Hunger Vital Sign    Worried About Running Out of Food in the Last Year: Never true    Ran Out of Food in the Last Year: Never true  Transportation Needs: No Transportation Needs (06/28/2023)   PRAPARE - Administrator, Civil Service (Medical): No    Lack of Transportation (Non-Medical): No  Physical Activity: Sufficiently Active (06/28/2023)   Exercise Vital Sign    Days of Exercise per Week: 7 days    Minutes of Exercise per Session: 40 min  Stress: Stress Concern Present (06/28/2023)   [employer] of Occupational Health - Occupational Stress Questionnaire    Feeling of Stress : To some extent  Social Connections: Socially Integrated (06/28/2023)   Social Connection and Isolation Panel [NHANES]    Frequency of Communication with Friends and Family: More than three times a week    Frequency of Social Gatherings with Friends and Family: More than three times a week    Attends Religious Services: More than 4 times per year    Active Member of [employer] or Organizations: Yes    Attends Engineer, structural: More than 4 times per year    Marital Status: Married  Catering manager Violence: Not At Risk (04/22/2023)   Humiliation, Afraid, Rape, and Kick questionnaire    Fear of Current or Ex-Partner: No    Emotionally Abused: No     Physically Abused: No    Sexually Abused: No    Family History  Problem Relation Age of Onset   Hypertension Mother    Alcohol abuse Father    COPD Brother    Hypertension Brother    Alcohol abuse Brother    Heart disease Maternal Grandmother    Cancer Maternal Grandfather    Stroke Paternal Grandmother    Gout Brother    Alcohol abuse Brother      Review of Systems  Constitutional: Negative.  Negative for chills and fever.  HENT: Negative.  Negative for congestion and sore  throat.   Respiratory: Negative.  Negative for cough and shortness of breath.   Cardiovascular: Negative.  Negative for chest pain and palpitations.  Gastrointestinal:  Negative for abdominal pain, diarrhea, nausea and vomiting.  Genitourinary: Negative.  Negative for dysuria and hematuria.  Skin: Negative.  Negative for rash.  Neurological: Negative.  Negative for dizziness and headaches.  All other systems reviewed and are negative.   Today's Vitals   07/01/23 0835  BP: (!) 150/78  Pulse: (!) 53  Temp: 97.6 F (36.4 C)  TempSrc: Oral  SpO2: 98%  Weight: 103 lb (46.7 kg)  Height: 4\' 11"  (1.499 m)   Body mass index is 20.8 kg/m.   Physical Exam Vitals reviewed.  Constitutional:      Appearance: Normal appearance.  HENT:     Head: Normocephalic.  Eyes:     Extraocular Movements: Extraocular movements intact.  Cardiovascular:     Rate and Rhythm: Normal rate.  Pulmonary:     Effort: Pulmonary effort is normal.  Skin:    General: Skin is warm and dry.  Neurological:     Mental Status: She is alert and oriented to person, place, and time.  Psychiatric:        Behavior: Behavior normal.      ASSESSMENT & PLAN: A total of 44 minutes was spent with the patient and counseling/coordination of care regarding preparing for this visit, review of most recent office visit notes, review of multiple chronic medical conditions and their management, cardiovascular risks associated with  hypertension, review of all medications, review of most recent bloodwork results, review of health maintenance items, education on nutrition, prognosis, documentation, and need for follow up.   Problem List Items Addressed This Visit       Cardiovascular and Mediastinum   Essential hypertension - Primary   BP Readings from Last 3 Encounters:  07/01/23 (!) 150/78  04/30/23 134/78  04/23/23 (!) 136/51  Normal blood pressure readings at home Well-controlled hypertension off olmesartan. Continue amlodipine 2.5 mg daily.  Continue Sectral 200 mg daily. Cardiovascular risks associated with hypertension discussed Diet and nutrition discussed       Relevant Orders   CBC with Differential/Platelet   Comprehensive metabolic panel with GFR   PAD (peripheral artery disease) (HCC)   Stable chronic condition Continues daily baby aspirin        Genitourinary   CKD (chronic kidney disease), stage III (HCC)   Chronic stable condition Advised to stay well-hydrated and avoid NSAIDs      Relevant Orders   Comprehensive metabolic panel with GFR   Microalbumin / creatinine urine ratio     Other   Dyslipidemia   Stable chronic condition Continues low-dose rosuvastatin 2.5 mg daily Higher doses gave her musculoskeletal side effects      Relevant Orders   Hemoglobin A1c   Lipid panel   Anxiety disorder   Stable and well-controlled Continues lorazepam 0.5 mg daily as needed      Relevant Medications   LORazepam (ATIVAN) 0.5 MG tablet   Patient Instructions  Health Maintenance After Age 1 After age 83, you are at a higher risk for certain long-term diseases and infections as well as injuries from falls. Falls are a major cause of broken bones and head injuries in people who are older than age 34. Getting regular preventive care can help to keep you healthy and well. Preventive care includes getting regular testing and making lifestyle changes as recommended by your health care  provider. Talk  with your health care provider about: Which screenings and tests you should have. A screening is a test that checks for a disease when you have no symptoms. A diet and exercise plan that is right for you. What should I know about screenings and tests to prevent falls? Screening and testing are the best ways to find a health problem early. Early diagnosis and treatment give you the best chance of managing medical conditions that are common after age 31. Certain conditions and lifestyle choices may make you more likely to have a fall. Your health care provider may recommend: Regular vision checks. Poor vision and conditions such as cataracts can make you more likely to have a fall. If you wear glasses, make sure to get your prescription updated if your vision changes. Medicine review. Work with your health care provider to regularly review all of the medicines you are taking, including over-the-counter medicines. Ask your health care provider about any side effects that may make you more likely to have a fall. Tell your health care provider if any medicines that you take make you feel dizzy or sleepy. Strength and balance checks. Your health care provider may recommend certain tests to check your strength and balance while standing, walking, or changing positions. Foot health exam. Foot pain and numbness, as well as not wearing proper footwear, can make you more likely to have a fall. Screenings, including: Osteoporosis screening. Osteoporosis is a condition that causes the bones to get weaker and break more easily. Blood pressure screening. Blood pressure changes and medicines to control blood pressure can make you feel dizzy. Depression screening. You may be more likely to have a fall if you have a fear of falling, feel depressed, or feel unable to do activities that you used to do. Alcohol use screening. Using too much alcohol can affect your balance and may make you more likely to have  a fall. Follow these instructions at home: Lifestyle Do not drink alcohol if: Your health care provider tells you not to drink. If you drink alcohol: Limit how much you have to: 0-1 drink a day for women. 0-2 drinks a day for men. Know how much alcohol is in your drink. In the [place] one drink equals one 12 oz bottle of beer (355 mL), one 5 oz glass of wine (148 mL), or one 1 oz glass of hard liquor (44 mL). Do not use any products that contain nicotine or tobacco. These products include cigarettes, chewing tobacco, and vaping devices, such as e-cigarettes. If you need help quitting, ask your health care provider. Activity  Follow a regular exercise program to stay fit. This will help you maintain your balance. Ask your health care provider what types of exercise are appropriate for you. If you need a cane or walker, use it as recommended by your health care provider. Wear supportive shoes that have nonskid soles. Safety  Remove any tripping hazards, such as rugs, cords, and clutter. Install safety equipment such as grab bars in bathrooms and safety rails on stairs. Keep rooms and walkways well-lit. General instructions Talk with your health care provider about your risks for falling. Tell your health care provider if: You fall. Be sure to tell your health care provider about all falls, even ones that seem minor. You feel dizzy, tiredness (fatigue), or off-balance. Take over-the-counter and prescription medicines only as told by your health care provider. These include supplements. Eat a healthy diet and maintain a healthy weight. A healthy diet includes low-fat  dairy products, low-fat (lean) meats, and fiber from whole grains, beans, and lots of fruits and vegetables. Stay current with your vaccines. Schedule regular health, dental, and eye exams. Summary Having a healthy lifestyle and getting preventive care can help to protect your health and wellness after age 54. Screening and  testing are the best way to find a health problem early and help you avoid having a fall. Early diagnosis and treatment give you the best chance for managing medical conditions that are more common for people who are older than age 63. Falls are a major cause of broken bones and head injuries in people who are older than age 16. Take precautions to prevent a fall at home. Work with your health care provider to learn what changes you can make to improve your health and wellness and to prevent falls. This information is not intended to replace advice given to you by your health care provider. Make sure you discuss any questions you have with your health care provider. Document Revised: 08/07/2020 Document Reviewed: 08/07/2020 Elsevier Patient Education  2024 [employer]     Edwina Barth, MD [facility]

## 2023-07-01 NOTE — Assessment & Plan Note (Signed)
 Chronic stable condition. Advised to stay well-hydrated and avoid NSAIDs

## 2023-07-04 ENCOUNTER — Ambulatory Visit: Attending: Internal Medicine

## 2023-07-04 DIAGNOSIS — M542 Cervicalgia: Secondary | ICD-10-CM | POA: Diagnosis present

## 2023-07-04 DIAGNOSIS — R42 Dizziness and giddiness: Secondary | ICD-10-CM | POA: Diagnosis present

## 2023-07-04 NOTE — Therapy (Signed)
 OUTPATIENT PHYSICAL THERAPY VESTIBULAR TREATMENT NOTE     Patient Name: Stephanie Aguilar MRN: [id number] DOB:[birth year], 83 y.o., female Today's Date: 07/04/2023      END OF SESSION:  PT End of Session - 07/04/23 0851     Visit Number 15    Number of Visits 17    Date for PT Re-Evaluation 07/11/23    Authorization Type [employer]    Authorization Time Period approved 5 PT visits from 06/27/2023-07/25/2023    PT Start Time 0845    PT Stop Time 0930    PT Time Calculation (min) 45 min    Equipment Utilized During Treatment Gait belt    Activity Tolerance Patient tolerated treatment well    Behavior During Therapy WFL for tasks assessed/performed                     Past Medical History:  Diagnosis Date   Allergy    Anxiety    Cataract    Depression    Hyperlipidemia    Hypertension    Past Surgical History:  Procedure Laterality Date   ABDOMINAL HYSTERECTOMY     APPENDECTOMY     BREAST SURGERY     DENTAL SURGERY     TONSILLECTOMY AND ADENOIDECTOMY     Patient Active Problem List   Diagnosis Date Noted   CKD (chronic kidney disease), stage III (HCC) 04/13/2016   PAD (peripheral artery disease) (HCC) 04/12/2016   Anxiety disorder 11/07/2015   Carotid bruit 07/06/2013   Essential hypertension 02/04/2012   Dyslipidemia 02/04/2012   Menopausal syndrome on hormone replacement therapy 02/04/2012    PCP: Tressa Busman, MD REFERRING PROVIDER: Lorin Glass, MD  REFERRING DIAG: H81.12 (ICD-10-CM) - Benign paroxysmal positional vertigo of left ear  THERAPY DIAG:  Dizziness and giddiness  Cervicalgia  ONSET DATE: 7 days ago  Rationale for Evaluation and Treatment: Rehabilitation  SUBJECTIVE:   SUBJECTIVE STATEMENT: The week has been good, but last night tossing and turning     Pt accompanied by: self   PERTINENT HISTORY: PMH significant for HTN, HLD, anxiety/depression. Patient presented to the ED at  [facility] yesterday 1/20 with complaint of dizziness with nausea upon getting up in the morning.  She has been experiencing it for the last 5 days every morning.  She feels the room spins around her for a short time.  The day prior to presentation, it happened when she was having a photo as he had to sit down.  PAIN:  Are you having pain? Yes: NPRS scale: 1/10 Pain location: L neck Pain description: sharp Aggravating factors: neck rotation, reaching Relieving factors: slow movements, avoidance  PRECAUTIONS: None  RED FLAGS: None   WEIGHT BEARING RESTRICTIONS: No  FALLS: Has patient fallen in last 6 months? No  LIVING ENVIRONMENT: Lives with: lives with their spouse Lives in: House/apartment, 2nd floor BR Stairs: exterior/interior w/ HR Has following equipment at home: None, has a shower chair and walker at her disposal  PLOF: Independent, walks for exercise, yard work  PATIENT GOALS: eliminate symptoms  OBJECTIVE:    TODAY'S TREATMENT: 07/04/23 Activity Comments  Right Dix-Hallpike Very small amplitude right upbeating nystagmus, minimal symptoms  Left Dix-Hallpike Larger amplitude left upbeating x 55 sec  Loaded left Dix-Hallpike Left upbeating x 45 sec  Left Epley   Left Dix-Hallpike 15 sec latency then return of left upbeating nystagmus x 25 sec  Left Epley   Left Dix-Hallpike No nystagmus, no symptoms  TODAY'S TREATMENT: 06/30/23 Activity Comments  review of L rolling EO 3x /EC Cueing to bend knees and roll entire body, roll quickly. Pt tolerated well and without dizziness   After completing activity pt reported mild sense of spinning- small amplitude R upbeating torsional nystagmus evident which resolved   L rolling EC 3x C/o mild dizziness on 1st and 3rd rep. C/o wooziness upon sitting up on EOM  review of sitting anterior canal habituation EO (however patient reports she was performing with EC) 3x each Good tolerance, good speed   Standing on foam: EC 30" EO  head turns 30" EO head nods30" EO 30" Romberg EC 2x30" Moderate sway with last condition initially; improved to mild sway after cues to tighten core and redirect wt shift anteriorly     PATIENT EDUCATION: Education details: discussion on remainder of POC, encouraged return to HEP in the case of a flare up Person educated: Patient Education method: Explanation, Demonstration, Tactile cues, and Verbal cues Education comprehension: verbalized understanding      HOME EXERCISE PROGRAM Last updated: 06/23/23 Access Code: [email or web address] URL: [email or web address] Date: 06/23/2023 Prepared by: [facility] Outpatient  Re[facility] - [facility]  Exercises - Cervical Extension AROM with Strap  - 1 x daily - 7 x weekly - 3 sets - 10 reps - Mid-Lower Cervical Extension SNAG with Strap  - 1 x daily - 7 x weekly - 3 sets - 10 reps - Standing Lower Cervical and Upper Thoracic Stretch  - 1 x daily - 7 x weekly - 3 sets - 10 reps - Supine Isometric Neck Extension  - 1 x daily - 7 x weekly - 1-3 sets - 10 reps - 2 sec hold - Seated Assisted Cervical Rotation with Towel  - 1-2 x daily - 7 x weekly - 1 sets - 3-5 reps - 15-30 sec hold - Brandt-Daroff Vestibular Exercise (Mirrored)  - 1 x daily - 5 x weekly - 2 sets - 3-5 reps - Seated Nose to Left Knee Vestibular Habituation  - 1 x daily - 5 x weekly - 1-2 sets - 3 reps - 10 sec  hold - Seated Nose to Left Knee Vestibular Habituation  - 1 x daily - 5 x weekly - 1-2 sets - 3 reps - 10 sec  hold    -------------------------------------------------------------- Note: Objective measures were completed at Evaluation unless otherwise noted.  DIAGNOSTIC FINDINGS:   COGNITION: Overall cognitive status: Within functional limits for tasks assessed   SENSATION: WFL  EDEMA:    PALPATION:  tenderness and trigger points appreciated along medial scapular border R > L   DTRs:  NT  POSTURE:  No Significant postural limitations  Cervical  ROM:    Active A/PROM (deg) eval  Flexion 60  Extension 20  Right lateral flexion 15  Left lateral flexion 15  Right rotation 25  Left rotation 25  (Blank rows = not tested)  STRENGTH:   LOWER EXTREMITY MMT:   MMT Right eval Left eval  Hip flexion    Hip abduction    Hip adduction    Hip internal rotation    Hip external rotation    Knee flexion    Knee extension    Ankle dorsiflexion    Ankle plantarflexion    Ankle inversion    Ankle eversion    (Blank rows = not tested)  BED MOBILITY:  indep  TRANSFERS: Indep    GAIT: Gait pattern:  decreased speed due to guarding from symptoms Distance  walked:  Assistive device utilized: None Level of assistance: Complete Independence Comments:     VESTIBULAR ASSESSMENT:  GENERAL OBSERVATION: wears progressive lens glasses   SYMPTOM BEHAVIOR:  Subjective history: 7 day hx of positional vertigo  Non-Vestibular symptoms: neck pain  Type of dizziness: Spinning/Vertigo  Frequency: daily  Duration: seconds-minutes   Aggravating factors: Induced by position change: lying supine, rolling to the left, and supine to sit  Relieving factors: slow movements  Progression of symptoms: better  OCULOMOTOR EXAM:  Ocular Alignment: normal  Ocular ROM: No Limitations  Spontaneous Nystagmus: absent  Gaze-Induced Nystagmus: absent  Smooth Pursuits: intact  Saccades: slow  Convergence/Divergence:  cm    VESTIBULAR - OCULAR REFLEX:   Slow VOR: Comment: limited ROM due to neck pain  VOR Cancellation: Comment: slow, notes some provocation of symptoms  Head-Impulse Test: unable to perform due to neck pain  Dynamic Visual Acuity: Not able to be assessed   POSITIONAL TESTING: Right Dix-Hallpike: no nystagmus Left Dix-Hallpike: no nystagmus Right Roll Test: no nystagmus Left Roll Test: no nystagmus  MOTION SENSITIVITY:  Motion Sensitivity Quotient Intensity: 0 = none, 1 = Lightheaded, 2 = Mild, 3 = Moderate, 4 = Severe, 5  = Vomiting  Intensity  1. Sitting to supine   2. Supine to L side   3. Supine to R side   4. Supine to sitting   5. L Hallpike-Dix   6. Up from L    7. R Hallpike-Dix   8. Up from R    9. Sitting, head tipped to L knee   10. Head up from L knee   11. Sitting, head tipped to R knee   12. Head up from R knee   13. Sitting head turns x5   14.Sitting head nods x5   15. In stance, 180 turn to L    16. In stance, 180 turn to R     OTHOSTATICS: not done  FUNCTIONAL GAIT: Functional gait assessment: TBD M-CTSIB: TBD                                                                                                                            TREATMENT DATE: 04/29/23    PATIENT EDUCATION: Education details: assessment details, HEP initiation Person educated: Patient and Spouse Education method: Explanation and Handouts Education comprehension: needs further education  HOME EXERCISE PROGRAM:  Access Code: [email or web address] URL: [email or web address] Date: 04/29/2023 Prepared by: Shary Decamp  Exercises - Cervical Extension AROM with Strap  - 1 x daily - 7 x weekly - 3 sets - 10 reps - Mid-Lower Cervical Extension SNAG with Strap  - 1 x daily - 7 x weekly - 3 sets - 10 reps - Standing Lower Cervical and Upper Thoracic Stretch  - 1 x daily - 7 x weekly - 3 sets - 10 reps - Supine Isometric Neck Extension  - 1 x daily - 7 x weekly - 1-3 sets - 10 reps - 2  sec hold  GOALS: Goals reviewed with patient? Yes  SHORT TERM GOALS: Target date: same as LTG    LONG TERM GOALS: Target date: 07/11/2023    Patient will be independent in HEP to improve functional outcomes Baseline:  Goal status: MET 05/26/23  2.  Demo improved cervical spine ROM to 35 degrees rotation to improve comfort/safety when driving Baseline: 25 degrees R/L; improved 05/26/23 Goal status: MET 05/26/23  3.  Demo score of 25/30 Functional Gait Assessment to manifest improved balance and reduced risk for  falls Baseline: 26/30 05/26/23 Goal status: MET 05/26/23  4.  Pt to report neck pain not exceeding 2/10 during daily activities to improve comfort and activity tolerance Baseline: 7-8/10 with sharp pains with certain movements/activities; reports 1-2/10 on average day to day 05/26/23 Goal status: MET 05/26/23  4.  Pt to report 50% improvement in neck pain when climbing hills.  Baseline: reports pain Goal status: INITIAL 06/13/23  4.  Pt to report 0/10 dizziness with bed mobility.  Baseline: c/o dizziness sitting up in the AM and + positional dizziness  Goal status: INITIAL 06/13/23     ASSESSMENT:  CLINICAL IMPRESSION: Reports return of positional vertigo symptoms.  Deom +Left Dix-Hallpike with left, upbeating nystagmus.  Able to tolerate several reps of Left Epley maneuver with resolution of nystagmus and symptoms after final repetition and re-check to left Dix-Hallpike.  Notes ongoing motion sensitivity which has overall improved.  Reports difficulty with neck extension often requiring to hold her head when tipping head into extension as to look up in the trees and would like to address at next session  OBJECTIVE IMPAIRMENTS: Abnormal gait, decreased activity tolerance, decreased balance, decreased ROM, dizziness, hypomobility, increased muscle spasms, and pain.   ACTIVITY LIMITATIONS: carrying, lifting, bending, reach over head, and locomotion level  PARTICIPATION LIMITATIONS: meal prep, cleaning, laundry, driving, shopping, community activity, yard work, and exercise routine  PERSONAL FACTORS: Age, Time since onset of injury/illness/exacerbation, and 1 comorbidity: PMH  are also affecting patient's functional outcome.   REHAB POTENTIAL: Excellent  CLINICAL DECISION MAKING: Evolving/moderate complexity  EVALUATION COMPLEXITY: Moderate   PLAN:  PT FREQUENCY: 1-2x/week  PT DURATION: 4 weeks  PLANNED INTERVENTIONS: 97164- PT Re-evaluation, 97110-Therapeutic exercises, 97530-  Therapeutic activity, [id number]- Neuromuscular re-education, 97535- Self Care, [id number]- Manual therapy, [id number]- Gait training, [phone]- Canalith repositioning, [id number]- Aquatic Therapy, [phone]- Electrical stimulation (manual), Patient/Family education, Balance training, Taping, Dry Needling, Joint mobilization, Spinal mobilization, Vestibular training, DME instructions, Cryotherapy, and Moist heat  PLAN FOR NEXT SESSION:  Would like to address deficits with cervical extension (looking up to trees to see birds) work on lying flat > sitting up habituation; retest R DH and treat as needed.  Continue habituation. Review and progress gentle neck exercises.   10:01 AM, 07/04/23 M. Shary Decamp, PT, DPT Physical Therapist- [place] Office Number: [phone]

## 2023-07-07 ENCOUNTER — Encounter: Payer: Self-pay | Admitting: Physical Therapy

## 2023-07-07 ENCOUNTER — Ambulatory Visit: Admitting: Physical Therapy

## 2023-07-07 DIAGNOSIS — R42 Dizziness and giddiness: Secondary | ICD-10-CM

## 2023-07-07 DIAGNOSIS — M542 Cervicalgia: Secondary | ICD-10-CM

## 2023-07-07 NOTE — Therapy (Signed)
 OUTPATIENT PHYSICAL THERAPY VESTIBULAR TREATMENT NOTE     Patient Name: Stephanie Aguilar MRN: [id number] DOB:[birth year], 83 y.o., female Today's Date: 07/07/2023      END OF SESSION:  PT End of Session - 07/07/23 0852     Visit Number 16    Number of Visits 17    Date for PT Re-Evaluation 07/11/23    Authorization Type [employer]    Authorization Time Period approved 5 PT visits from 06/27/2023-07/25/2023    PT Start Time 0849    PT Stop Time 0929    PT Time Calculation (min) 40 min    Equipment Utilized During Treatment Gait belt    Activity Tolerance Patient tolerated treatment well    Behavior During Therapy WFL for tasks assessed/performed                      Past Medical History:  Diagnosis Date   Allergy    Anxiety    Cataract    Depression    Hyperlipidemia    Hypertension    Past Surgical History:  Procedure Laterality Date   ABDOMINAL HYSTERECTOMY     APPENDECTOMY     BREAST SURGERY     DENTAL SURGERY     TONSILLECTOMY AND ADENOIDECTOMY     Patient Active Problem List   Diagnosis Date Noted   CKD (chronic kidney disease), stage III (HCC) 04/13/2016   PAD (peripheral artery disease) (HCC) 04/12/2016   Anxiety disorder 11/07/2015   Carotid bruit 07/06/2013   Essential hypertension 02/04/2012   Dyslipidemia 02/04/2012   Menopausal syndrome on hormone replacement therapy 02/04/2012    PCP: Tressa Busman, MD REFERRING PROVIDER: Lorin Glass, MD  REFERRING DIAG: H81.12 (ICD-10-CM) - Benign paroxysmal positional vertigo of left ear  THERAPY DIAG:  Dizziness and giddiness  Cervicalgia  ONSET DATE: 7 days ago  Rationale for Evaluation and Treatment: Rehabilitation  SUBJECTIVE:   SUBJECTIVE STATEMENT: Was a little "bumbly" yesterday morning, but did a few exercises and felt okay.  I was able to walk 4 miles at [facility].  At the end of the day, my neck muscle "seizes up"-not as bad as it  was.  Pt accompanied by: self   PERTINENT HISTORY: PMH significant for HTN, HLD, anxiety/depression. Patient presented to the ED at [facility] yesterday 1/20 with complaint of dizziness with nausea upon getting up in the morning.  She has been experiencing it for the last 5 days every morning.  She feels the room spins around her for a short time.  The day prior to presentation, it happened when she was having a photo as he had to sit down.  PAIN:  Are you having pain? Yes: NPRS scale: 1/10 Pain location: L neck Pain description: sharp Aggravating factors: neck rotation, reaching Relieving factors: slow movements, avoidance  PRECAUTIONS: None  RED FLAGS: None   WEIGHT BEARING RESTRICTIONS: No  FALLS: Has patient fallen in last 6 months? No  LIVING ENVIRONMENT: Lives with: lives with their spouse Lives in: House/apartment, 2nd floor BR Stairs: exterior/interior w/ HR Has following equipment at home: None, has a shower chair and walker at her disposal  PLOF: Independent, walks for exercise, yard work  PATIENT GOALS: eliminate symptoms  OBJECTIVE:    TODAY'S TREATMENT: 07/07/2023 Activity Comments  Seated chin tuck 2 x 5 reps 3 sec hold  Attempted treadmill gait on incline to simulate outdoor walking Pt takes small steps and difficult to take longer strides and work  on posture with inclined gait  Standing chin tuck at doorframe, 3 sec, 5 reps Cues for technique  Resisted gait with blue band, 8 reps x 50 ft Cues to relax and avoid forward lean, forward neck posture; pt reports this is more like walking inclines outdoors and notices she does tend to have forward lean/forward neck and has to actively work on retraction/more upright posture  Reviewed bed mobility-pt would go from L sidelying up to sit No symptoms noted  She demo Brandt-Daroff from L She is guarded/ demo well; discussed holding off on Brandt-Daroff since she is not currently having symptoms with bed mobility   HOME  EXERCISE PROGRAM:  Access Code: [email or web address] URL: [email or web address] Date: 07/07/2023 Prepared by: [facility] - Outpatient  Rehab - [facility]  Exercises - Standing Lower Cervical and Upper Thoracic Stretch  - 1 x daily - 7 x weekly - 3 sets - 10 reps - Supine Isometric Neck Extension  - 1 x daily - 7 x weekly - 1-3 sets - 10 reps - 2 sec hold - Seated Assisted Cervical Rotation with Towel  - 1-2 x daily - 7 x weekly - 1 sets - 3-5 reps - 15-30 sec hold - Brandt-Daroff Vestibular Exercise (Mirrored)  - 1 x daily - 5 x weekly - 2 sets - 3-5 reps - Seated Nose to Left Knee Vestibular Habituation  - 1 x daily - 5 x weekly - 1-2 sets - 3 reps - 10 sec  hold - Seated Nose to Left Knee Vestibular Habituation  - 1 x daily - 5 x weekly - 1-2 sets - 3 reps - 10 sec  hold - Supine to Left Sidelying Vestibular Habituation  - 1-2 x daily - 7 x weekly - 1 sets - 3 reps - Seated Passive Cervical Retraction  - 1- x daily - 7 x weekly - 3 sets - 5 reps - 3-5 sec hold  TODAY'S TREATMENT: 07/04/23 Activity Comments  Right Dix-Hallpike Very small amplitude right upbeating nystagmus, minimal symptoms  Left Dix-Hallpike Larger amplitude left upbeating x 55 sec  Loaded left Dix-Hallpike Left upbeating x 45 sec  Left Epley   Left Dix-Hallpike 15 sec latency then return of left upbeating nystagmus x 25 sec  Left Epley   Left Dix-Hallpike No nystagmus, no symptoms     TODAY'S TREATMENT: 06/30/23 Activity Comments  review of L rolling EO 3x /EC Cueing to bend knees and roll entire body, roll quickly. Pt tolerated well and without dizziness   After completing activity pt reported mild sense of spinning- small amplitude R upbeating torsional nystagmus evident which resolved   L rolling EC 3x C/o mild dizziness on 1st and 3rd rep. C/o wooziness upon sitting up on EOM  review of sitting anterior canal habituation EO (however patient reports she was performing with EC) 3x each Good tolerance, good  speed   Standing on foam: EC 30" EO head turns 30" EO head nods30" EO 30" Romberg EC 2x30" Moderate sway with last condition initially; improved to mild sway after cues to tighten core and redirect wt shift anteriorly     PATIENT EDUCATION: Education details: posture with gait; normal bed mobility movement (holding off on Brandt-Daroff if she no longer has symtpoms); reviewed benefits of habituation and progression to normal movement patterns Person educated: Patient Education method: Explanation, Demonstration, Tactile cues, and Verbal cues Education comprehension: verbalized understanding        -------------------------------------------------------------- Note: Objective measures were completed at Evaluation unless otherwise  noted.  DIAGNOSTIC FINDINGS:   COGNITION: Overall cognitive status: Within functional limits for tasks assessed   SENSATION: WFL  EDEMA:    PALPATION:  tenderness and trigger points appreciated along medial scapular border R > L   DTRs:  NT  POSTURE:  No Significant postural limitations  Cervical ROM:    Active A/PROM (deg) eval  Flexion 60  Extension 20  Right lateral flexion 15  Left lateral flexion 15  Right rotation 25  Left rotation 25  (Blank rows = not tested)  STRENGTH:   LOWER EXTREMITY MMT:   MMT Right eval Left eval  Hip flexion    Hip abduction    Hip adduction    Hip internal rotation    Hip external rotation    Knee flexion    Knee extension    Ankle dorsiflexion    Ankle plantarflexion    Ankle inversion    Ankle eversion    (Blank rows = not tested)  BED MOBILITY:  indep  TRANSFERS: Indep    GAIT: Gait pattern:  decreased speed due to guarding from symptoms Distance walked:  Assistive device utilized: None Level of assistance: Complete Independence Comments:     VESTIBULAR ASSESSMENT:  GENERAL OBSERVATION: wears progressive lens glasses   SYMPTOM BEHAVIOR:  Subjective history: 7  day hx of positional vertigo  Non-Vestibular symptoms: neck pain  Type of dizziness: Spinning/Vertigo  Frequency: daily  Duration: seconds-minutes   Aggravating factors: Induced by position change: lying supine, rolling to the left, and supine to sit  Relieving factors: slow movements  Progression of symptoms: better  OCULOMOTOR EXAM:  Ocular Alignment: normal  Ocular ROM: No Limitations  Spontaneous Nystagmus: absent  Gaze-Induced Nystagmus: absent  Smooth Pursuits: intact  Saccades: slow  Convergence/Divergence:  cm    VESTIBULAR - OCULAR REFLEX:   Slow VOR: Comment: limited ROM due to neck pain  VOR Cancellation: Comment: slow, notes some provocation of symptoms  Head-Impulse Test: unable to perform due to neck pain  Dynamic Visual Acuity: Not able to be assessed   POSITIONAL TESTING: Right Dix-Hallpike: no nystagmus Left Dix-Hallpike: no nystagmus Right Roll Test: no nystagmus Left Roll Test: no nystagmus  MOTION SENSITIVITY:  Motion Sensitivity Quotient Intensity: 0 = none, 1 = Lightheaded, 2 = Mild, 3 = Moderate, 4 = Severe, 5 = Vomiting  Intensity  1. Sitting to supine   2. Supine to L side   3. Supine to R side   4. Supine to sitting   5. L Hallpike-Dix   6. Up from L    7. R Hallpike-Dix   8. Up from R    9. Sitting, head tipped to L knee   10. Head up from L knee   11. Sitting, head tipped to R knee   12. Head up from R knee   13. Sitting head turns x5   14.Sitting head nods x5   15. In stance, 180 turn to L    16. In stance, 180 turn to R     OTHOSTATICS: not done  FUNCTIONAL GAIT: Functional gait assessment: TBD M-CTSIB: TBD  TREATMENT DATE: 04/29/23    PATIENT EDUCATION: Education details: assessment details, HEP initiation Person educated: Patient and Spouse Education method: Explanation and Handouts Education  comprehension: needs further education  HOME EXERCISE PROGRAM:  Access Code: [email or web address] URL: [email or web address] Date: 04/29/2023 Prepared by: Shary Decamp  Exercises - Cervical Extension AROM with Strap  - 1 x daily - 7 x weekly - 3 sets - 10 reps - Mid-Lower Cervical Extension SNAG with Strap  - 1 x daily - 7 x weekly - 3 sets - 10 reps - Standing Lower Cervical and Upper Thoracic Stretch  - 1 x daily - 7 x weekly - 3 sets - 10 reps - Supine Isometric Neck Extension  - 1 x daily - 7 x weekly - 1-3 sets - 10 reps - 2 sec hold  GOALS: Goals reviewed with patient? Yes  SHORT TERM GOALS: Target date: same as LTG    LONG TERM GOALS: Target date: 07/11/2023    Patient will be independent in HEP to improve functional outcomes Baseline:  Goal status: MET 05/26/23  2.  Demo improved cervical spine ROM to 35 degrees rotation to improve comfort/safety when driving Baseline: 25 degrees R/L; improved 05/26/23 Goal status: MET 05/26/23  3.  Demo score of 25/30 Functional Gait Assessment to manifest improved balance and reduced risk for falls Baseline: 26/30 05/26/23 Goal status: MET 05/26/23  4.  Pt to report neck pain not exceeding 2/10 during daily activities to improve comfort and activity tolerance Baseline: 7-8/10 with sharp pains with certain movements/activities; reports 1-2/10 on average day to day 05/26/23 Goal status: MET 05/26/23  4.  Pt to report 50% improvement in neck pain when climbing hills.  Baseline: reports pain Goal status: INITIAL 06/13/23  4.  Pt to report 0/10 dizziness with bed mobility.  Baseline: c/o dizziness sitting up in the AM and + positional dizziness  Goal status: INITIAL 06/13/23     ASSESSMENT:  CLINICAL IMPRESSION: Pt presents today with no new complaints; no episodes of vertigo since last visit. Skilled PT session focused on simulating hills and inclines, with attention to posture; pt tends to have forward trunk flexion and forward  head posture and indicates this may be why she has more pain during walking on inclines.  She responds well to cues for more upright posture and chin tuck position with simulate incline gait.  Updated HEP to add seated and standing chin tuck exercises to try to incorporate this to avoid pain in neck by end of day.  Also, focused time to educate patient on importance of normal movement patterns for bed mobility and to perhaps hold on Brandt-Daroff (which brings her into a more guarded position) for the next few days and see how she does.  Pt will continue to benefit from skilled PT towards goals for improved functional mobility and decreased fall risk.    OBJECTIVE IMPAIRMENTS: Abnormal gait, decreased activity tolerance, decreased balance, decreased ROM, dizziness, hypomobility, increased muscle spasms, and pain.   ACTIVITY LIMITATIONS: carrying, lifting, bending, reach over head, and locomotion level  PARTICIPATION LIMITATIONS: meal prep, cleaning, laundry, driving, shopping, community activity, yard work, and exercise routine  PERSONAL FACTORS: Age, Time since onset of injury/illness/exacerbation, and 1 comorbidity: PMH  are also affecting patient's functional outcome.   REHAB POTENTIAL: Excellent  CLINICAL DECISION MAKING: Evolving/moderate complexity  EVALUATION COMPLEXITY: Moderate   PLAN:  PT FREQUENCY: 1-2x/week  PT DURATION: 4 weeks  PLANNED INTERVENTIONS: [id number]- PT Re-evaluation, 97110-Therapeutic exercises, 97530- Therapeutic activity, [id number]- Neuromuscular re-education,  [id number]- Self Care, [id number]- Manual therapy, [id number]- Gait training, [phone]- Canalith repositioning, [id number]- Aquatic Therapy, [phone]- Electrical stimulation (manual), Patient/Family education, Balance training, Taping, Dry Needling, Joint mobilization, Spinal mobilization, Vestibular training, DME instructions, Cryotherapy, and Moist heat  PLAN FOR NEXT SESSION:  Check LTGs and discuss POC.  Ask how she did with regular bed  mobility and with inclined gait with focus on posture.   Lonia Blood, PT 07/07/23 9:43 AM Phone: [phone] Fax: [phone]  [facility] at [facility] [address] [place], [address] [place], [place] [address] Phone # [phone] Fax # [phone]

## 2023-07-10 NOTE — Therapy (Signed)
 OUTPATIENT PHYSICAL THERAPY VESTIBULAR TREATMENT NOTE     Patient Name: Stephanie Aguilar MRN: [id number] DOB:04/06/1940, 83 y.o., female Today's Date: 07/10/2023      END OF SESSION:             Past Medical History:  Diagnosis Date   Allergy    Anxiety    Cataract    Depression    Hyperlipidemia    Hypertension    Past Surgical History:  Procedure Laterality Date   ABDOMINAL HYSTERECTOMY     APPENDECTOMY     BREAST SURGERY     DENTAL SURGERY     TONSILLECTOMY AND ADENOIDECTOMY     Patient Active Problem List   Diagnosis Date Noted   CKD (chronic kidney disease), stage III (HCC) 04/13/2016   PAD (peripheral artery disease) (HCC) 04/12/2016   Anxiety disorder 11/07/2015   Carotid bruit 07/06/2013   Essential hypertension 02/04/2012   Dyslipidemia 02/04/2012   Menopausal syndrome on hormone replacement therapy 02/04/2012    PCP: Tressa Busman, MD REFERRING PROVIDER: Lorin Glass, MD  REFERRING DIAG: H81.12 (ICD-10-CM) - Benign paroxysmal positional vertigo of left ear  THERAPY DIAG:  No diagnosis found.  ONSET DATE: 7 days ago  Rationale for Evaluation and Treatment: Rehabilitation  SUBJECTIVE:   SUBJECTIVE STATEMENT: Was a little "bumbly" yesterday morning, but did a few exercises and felt okay.  I was able to walk 4 miles at [facility].  At the end of the day, my neck muscle "seizes up"-not as bad as it was.  Pt accompanied by: self   PERTINENT HISTORY: PMH significant for HTN, HLD, anxiety/depression. Patient presented to the ED at [facility] yesterday 1/20 with complaint of dizziness with nausea upon getting up in the morning.  She has been experiencing it for the last 5 days every morning.  She feels the room spins around her for a short time.  The day prior to presentation, it happened when she was having a photo as he had to sit down.  PAIN:  Are you having pain? Yes: NPRS scale: 1/10 Pain location: L  neck Pain description: sharp Aggravating factors: neck rotation, reaching Relieving factors: slow movements, avoidance  PRECAUTIONS: None  RED FLAGS: None   WEIGHT BEARING RESTRICTIONS: No  FALLS: Has patient fallen in last 6 months? No  LIVING ENVIRONMENT: Lives with: lives with their spouse Lives in: House/apartment, 2nd floor BR Stairs: exterior/interior w/ HR Has following equipment at home: None, has a shower chair and walker at her disposal  PLOF: Independent, walks for exercise, yard work  PATIENT GOALS: eliminate symptoms  OBJECTIVE:    TODAY'S TREATMENT: 07/11/23 Activity Comments                       TODAY'S TREATMENT: 07/07/2023 Activity Comments  Seated chin tuck 2 x 5 reps 3 sec hold  Attempted treadmill gait on incline to simulate outdoor walking Pt takes small steps and difficult to take longer strides and work on posture with inclined gait  Standing chin tuck at doorframe, 3 sec, 5 reps Cues for technique  Resisted gait with blue band, 8 reps x 50 ft Cues to relax and avoid forward lean, forward neck posture; pt reports this is more like walking inclines outdoors and notices she does tend to have forward lean/forward neck and has to actively work on retraction/more upright posture  Reviewed bed mobility-pt would go from L sidelying up to sit No symptoms noted  She demo  Brandt-Daroff from L She is guarded/ demo well; discussed holding off on Brandt-Daroff since she is not currently having symptoms with bed mobility   HOME EXERCISE PROGRAM:  Access Code: [email or web address] URL: [email or web address] Date: 07/07/2023 Prepared by: [facility] - Outpatient  Rehab - [facility]  Exercises - Standing Lower Cervical and Upper Thoracic Stretch  - 1 x daily - 7 x weekly - 3 sets - 10 reps - Supine Isometric Neck Extension  - 1 x daily - 7 x weekly - 1-3 sets - 10 reps - 2 sec hold - Seated Assisted Cervical Rotation with Towel  - 1-2 x daily - 7 x  weekly - 1 sets - 3-5 reps - 15-30 sec hold - Brandt-Daroff Vestibular Exercise (Mirrored)  - 1 x daily - 5 x weekly - 2 sets - 3-5 reps - Seated Nose to Left Knee Vestibular Habituation  - 1 x daily - 5 x weekly - 1-2 sets - 3 reps - 10 sec  hold - Seated Nose to Left Knee Vestibular Habituation  - 1 x daily - 5 x weekly - 1-2 sets - 3 reps - 10 sec  hold - Supine to Left Sidelying Vestibular Habituation  - 1-2 x daily - 7 x weekly - 1 sets - 3 reps - Seated Passive Cervical Retraction  - 1- x daily - 7 x weekly - 3 sets - 5 reps - 3-5 sec hold  TODAY'S TREATMENT: 07/04/23 Activity Comments  Right Dix-Hallpike Very small amplitude right upbeating nystagmus, minimal symptoms  Left Dix-Hallpike Larger amplitude left upbeating x 55 sec  Loaded left Dix-Hallpike Left upbeating x 45 sec  Left Epley   Left Dix-Hallpike 15 sec latency then return of left upbeating nystagmus x 25 sec  Left Epley   Left Dix-Hallpike No nystagmus, no symptoms     TODAY'S TREATMENT: 06/30/23 Activity Comments  review of L rolling EO 3x /EC Cueing to bend knees and roll entire body, roll quickly. Pt tolerated well and without dizziness   After completing activity pt reported mild sense of spinning- small amplitude R upbeating torsional nystagmus evident which resolved   L rolling EC 3x C/o mild dizziness on 1st and 3rd rep. C/o wooziness upon sitting up on EOM  review of sitting anterior canal habituation EO (however patient reports she was performing with EC) 3x each Good tolerance, good speed   Standing on foam: EC 30" EO head turns 30" EO head nods30" EO 30" Romberg EC 2x30" Moderate sway with last condition initially; improved to mild sway after cues to tighten core and redirect wt shift anteriorly     PATIENT EDUCATION: Education details: posture with gait; normal bed mobility movement (holding off on Brandt-Daroff if she no longer has symtpoms); reviewed benefits of habituation and progression to normal  movement patterns Person educated: Patient Education method: Explanation, Demonstration, Tactile cues, and Verbal cues Education comprehension: verbalized understanding        -------------------------------------------------------------- Note: Objective measures were completed at Evaluation unless otherwise noted.  DIAGNOSTIC FINDINGS:   COGNITION: Overall cognitive status: Within functional limits for tasks assessed   SENSATION: WFL  EDEMA:    PALPATION:  tenderness and trigger points appreciated along medial scapular border R > L   DTRs:  NT  POSTURE:  No Significant postural limitations  Cervical ROM:    Active A/PROM (deg) eval  Flexion 60  Extension 20  Right lateral flexion 15  Left lateral flexion 15  Right rotation 25  Left rotation 25  (  Blank rows = not tested)  STRENGTH:   LOWER EXTREMITY MMT:   MMT Right eval Left eval  Hip flexion    Hip abduction    Hip adduction    Hip internal rotation    Hip external rotation    Knee flexion    Knee extension    Ankle dorsiflexion    Ankle plantarflexion    Ankle inversion    Ankle eversion    (Blank rows = not tested)  BED MOBILITY:  indep  TRANSFERS: Indep    GAIT: Gait pattern:  decreased speed due to guarding from symptoms Distance walked:  Assistive device utilized: None Level of assistance: Complete Independence Comments:     VESTIBULAR ASSESSMENT:  GENERAL OBSERVATION: wears progressive lens glasses   SYMPTOM BEHAVIOR:  Subjective history: 7 day hx of positional vertigo  Non-Vestibular symptoms: neck pain  Type of dizziness: Spinning/Vertigo  Frequency: daily  Duration: seconds-minutes   Aggravating factors: Induced by position change: lying supine, rolling to the left, and supine to sit  Relieving factors: slow movements  Progression of symptoms: better  OCULOMOTOR EXAM:  Ocular Alignment: normal  Ocular ROM: No Limitations  Spontaneous Nystagmus:  absent  Gaze-Induced Nystagmus: absent  Smooth Pursuits: intact  Saccades: slow  Convergence/Divergence:  cm    VESTIBULAR - OCULAR REFLEX:   Slow VOR: Comment: limited ROM due to neck pain  VOR Cancellation: Comment: slow, notes some provocation of symptoms  Head-Impulse Test: unable to perform due to neck pain  Dynamic Visual Acuity: Not able to be assessed   POSITIONAL TESTING: Right Dix-Hallpike: no nystagmus Left Dix-Hallpike: no nystagmus Right Roll Test: no nystagmus Left Roll Test: no nystagmus  MOTION SENSITIVITY:  Motion Sensitivity Quotient Intensity: 0 = none, 1 = Lightheaded, 2 = Mild, 3 = Moderate, 4 = Severe, 5 = Vomiting  Intensity  1. Sitting to supine   2. Supine to L side   3. Supine to R side   4. Supine to sitting   5. L Hallpike-Dix   6. Up from L    7. R Hallpike-Dix   8. Up from R    9. Sitting, head tipped to L knee   10. Head up from L knee   11. Sitting, head tipped to R knee   12. Head up from R knee   13. Sitting head turns x5   14.Sitting head nods x5   15. In stance, 180 turn to L    16. In stance, 180 turn to R     OTHOSTATICS: not done  FUNCTIONAL GAIT: Functional gait assessment: TBD M-CTSIB: TBD                                                                                                                            TREATMENT DATE: 04/29/23    PATIENT EDUCATION: Education details: assessment details, HEP initiation Person educated: Patient and Spouse Education method: Explanation and Handouts Education comprehension: needs further education  HOME EXERCISE PROGRAM:  Access Code: [email or web address] URL: [email or web address] Date: 04/29/2023 Prepared by: Shary Decamp  Exercises - Cervical Extension AROM with Strap  - 1 x daily - 7 x weekly - 3 sets - 10 reps - Mid-Lower Cervical Extension SNAG with Strap  - 1 x daily - 7 x weekly - 3 sets - 10 reps - Standing Lower Cervical and Upper Thoracic Stretch  - 1 x daily -  7 x weekly - 3 sets - 10 reps - Supine Isometric Neck Extension  - 1 x daily - 7 x weekly - 1-3 sets - 10 reps - 2 sec hold  GOALS: Goals reviewed with patient? Yes  SHORT TERM GOALS: Target date: same as LTG    LONG TERM GOALS: Target date: 07/11/2023    Patient will be independent in HEP to improve functional outcomes Baseline:  Goal status: MET 05/26/23  2.  Demo improved cervical spine ROM to 35 degrees rotation to improve comfort/safety when driving Baseline: 25 degrees R/L; improved 05/26/23 Goal status: MET 05/26/23  3.  Demo score of 25/30 Functional Gait Assessment to manifest improved balance and reduced risk for falls Baseline: 26/30 05/26/23 Goal status: MET 05/26/23  4.  Pt to report neck pain not exceeding 2/10 during daily activities to improve comfort and activity tolerance Baseline: 7-8/10 with sharp pains with certain movements/activities; reports 1-2/10 on average day to day 05/26/23 Goal status: MET 05/26/23  4.  Pt to report 50% improvement in neck pain when climbing hills.  Baseline: reports pain Goal status: INITIAL 06/13/23  4.  Pt to report 0/10 dizziness with bed mobility.  Baseline: c/o dizziness sitting up in the AM and + positional dizziness  Goal status: INITIAL 06/13/23     ASSESSMENT:  CLINICAL IMPRESSION: Pt presents today with no new complaints; no episodes of vertigo since last visit. Skilled PT session focused on simulating hills and inclines, with attention to posture; pt tends to have forward trunk flexion and forward head posture and indicates this may be why she has more pain during walking on inclines.  She responds well to cues for more upright posture and chin tuck position with simulate incline gait.  Updated HEP to add seated and standing chin tuck exercises to try to incorporate this to avoid pain in neck by end of day.  Also, focused time to educate patient on importance of normal movement patterns for bed mobility and to perhaps hold on  Brandt-Daroff (which brings her into a more guarded position) for the next few days and see how she does.  Pt will continue to benefit from skilled PT towards goals for improved functional mobility and decreased fall risk.    OBJECTIVE IMPAIRMENTS: Abnormal gait, decreased activity tolerance, decreased balance, decreased ROM, dizziness, hypomobility, increased muscle spasms, and pain.   ACTIVITY LIMITATIONS: carrying, lifting, bending, reach over head, and locomotion level  PARTICIPATION LIMITATIONS: meal prep, cleaning, laundry, driving, shopping, community activity, yard work, and exercise routine  PERSONAL FACTORS: Age, Time since onset of injury/illness/exacerbation, and 1 comorbidity: PMH  are also affecting patient's functional outcome.   REHAB POTENTIAL: Excellent  CLINICAL DECISION MAKING: Evolving/moderate complexity  EVALUATION COMPLEXITY: Moderate   PLAN:  PT FREQUENCY: 1-2x/week  PT DURATION: 4 weeks  PLANNED INTERVENTIONS: [id number]- PT Re-evaluation, 97110-Therapeutic exercises, 97530- Therapeutic activity, 97112- Neuromuscular re-education, 97535- Self Care, [id number]- Manual therapy, [id number]- Gait training, [phone]- Canalith repositioning, [id number]- Aquatic Therapy, [phone]- Electrical stimulation (manual), Patient/Family education, Balance training, Taping, Dry Needling, Joint mobilization, Spinal mobilization,  Vestibular training, DME instructions, Cryotherapy, and Moist heat  PLAN FOR NEXT SESSION:  Check LTGs and discuss POC.  Ask how she did with regular bed mobility and with inclined gait with focus on posture.   Lonia Blood, PT 07/10/23 5:35 PM Phone: [phone] Fax: [phone]  [facility] at [facility] [address] [place], [address] [place], [place] [address] Phone # [phone] Fax # [phone]

## 2023-07-11 ENCOUNTER — Encounter: Payer: Self-pay | Admitting: Physical Therapy

## 2023-07-11 ENCOUNTER — Ambulatory Visit: Admitting: Physical Therapy

## 2023-07-11 DIAGNOSIS — R42 Dizziness and giddiness: Secondary | ICD-10-CM

## 2023-07-11 DIAGNOSIS — M542 Cervicalgia: Secondary | ICD-10-CM

## 2023-10-27 ENCOUNTER — Ambulatory Visit (INDEPENDENT_AMBULATORY_CARE_PROVIDER_SITE_OTHER)

## 2023-10-27 VITALS — Ht 59.0 in | Wt 103.0 lb

## 2023-10-27 DIAGNOSIS — Z0000 Encounter for general adult medical examination without abnormal findings: Secondary | ICD-10-CM | POA: Diagnosis not present

## 2023-10-27 NOTE — Progress Notes (Signed)
 Subjective:   Stephanie Aguilar is a 83 y.o. who presents for a Medicare Wellness preventive visit.  As a reminder, Annual Wellness Visits don't include a physical exam, and some assessments may be limited, especially if this visit is performed virtually. We may recommend an in-person follow-up visit with your provider if needed.  Visit Complete: Virtual I connected with  Rollene Benton Kitty on 10/27/23 by a audio enabled telemedicine application and verified that I am speaking with the correct person using two identifiers.  Patient Location: Home  Provider Location: Home Office  I discussed the limitations of evaluation and management by telemedicine. The patient expressed understanding and agreed to proceed.  Vital Signs: Because this visit was a virtual/telehealth visit, some criteria may be missing or patient reported. Any vitals not documented were not able to be obtained and vitals that have been documented are patient reported.  VideoDeclined- This patient declined Librarian, academic. Therefore the visit was completed with audio only.  Persons Participating in Visit: Patient.  AWV Questionnaire: No: Patient Medicare AWV questionnaire was not completed prior to this visit.  Cardiac Aguilar Factors include: advanced age (>38men, >59 women);hypertension;Other (see comment);dyslipidemia, Aguilar factor comments: PAD, CKD     Objective:    Today's Vitals   10/27/23 1327  Weight: 103 lb (46.7 kg)  Height: 4' 11 (1.499 m)   Body mass index is 20.8 kg/m.     10/27/2023    1:40 PM 04/29/2023   10:40 AM 04/22/2023   12:19 PM 04/21/2023    9:19 AM 12/13/2021    3:31 PM 12/20/2019    8:50 AM 12/11/2016    8:34 AM  Advanced Directives  Does Patient Have a Medical Advance Directive? Yes Yes Yes Yes Yes Yes Yes   Type of Estate agent of [place];Living will Living will Living will  Healthcare Power of [place];Living will  Healthcare Power of [place];Living will Healthcare Power of [place];Living will  Does patient want to make changes to medical advance directive?  No - Patient declined No - Patient declined   Yes (ED - send information to MyChart)   Copy of Healthcare Power of Attorney in Chart? No - copy requested    No - copy requested  No - copy requested      Data saved with a previous flowsheet row definition    Current Medications (verified) Outpatient Encounter Medications as of 10/27/2023  Medication Sig   acebutolol  (SECTRAL ) 200 MG capsule Take 1 capsule (200 mg total) by mouth daily.   amLODipine  (NORVASC ) 2.5 MG tablet TAKE 1 TABLET EACH DAY.   aspirin  81 MG tablet Take 81 mg by mouth daily.   carboxymethylcellulose (REFRESH PLUS) 0.5 % SOLN Place 2 drops into both eyes 3 (three) times daily as needed.   Cholecalciferol (VITAMIN D ) 2000 units CAPS Take 1 capsule by mouth daily.   clobetasol  (TEMOVATE ) 0.05 % external solution Apply 10 drops to scalp every night at bedtime.   cycloSPORINE (RESTASIS) 0.05 % ophthalmic emulsion 1 drop 2 (two) times daily.   desloratadine  (CLARINEX ) 5 MG tablet Take 1 tablet (5 mg total) by mouth daily. as directed   DOTTI 0.025 MG/24HR Place 1 patch onto the skin 2 (two) times a week.   fluticasone  (FLONASE ) 50 MCG/ACT nasal spray USE 2 SPRAYS INTO EACH NOSTRIL DAILY   LORazepam  (ATIVAN ) 0.5 MG tablet Take 1 tablet (0.5 mg total) by mouth 2 (two) times daily as needed for anxiety.   meclizine  (  ANTIVERT ) 12.5 MG tablet Take 1 tablet (12.5 mg total) by mouth 3 (three) times daily as needed for dizziness.   METRONIDAZOLE , TOPICAL, 0.75 % LOTN APPLY TOPICALLY TO AFFECTED AREA.   ondansetron  (ZOFRAN -ODT) 4 MG disintegrating tablet Take 1 tablet (4 mg total) by mouth every 8 (eight) hours as needed for nausea or vomiting.   OVER THE COUNTER MEDICATION OTC Imodium taking daily for diarrrhea   rosuvastatin  (CRESTOR ) 5 MG tablet Take 0.5 tablets (2.5 mg total) by mouth  daily.   desloratadine  (CLARINEX ) 5 MG tablet Take 5 mg by mouth daily.   [Paused] olmesartan  (BENICAR ) 40 MG tablet Take 1 tablet (40 mg total) by mouth daily. (Patient not taking: Reported on 10/27/2023)   No facility-administered encounter medications on file as of 10/27/2023.    Allergies (verified) Lactose intolerance (gi), Codeine, Erythromycin, Lisinopril, Sulfa antibiotics, and Latex   History: Past Medical History:  Diagnosis Date   Allergy    Anxiety    Cataract    Depression    Hyperlipidemia    Hypertension    Past Surgical History:  Procedure Laterality Date   ABDOMINAL HYSTERECTOMY     APPENDECTOMY     BREAST SURGERY     DENTAL SURGERY     TONSILLECTOMY AND ADENOIDECTOMY     Family History  Problem Relation Age of Onset   Hypertension Mother    Alcohol abuse Father    COPD Brother    Hypertension Brother    Alcohol abuse Brother    Heart disease Maternal Grandmother    Cancer Maternal Grandfather    Stroke Paternal Grandmother    Gout Brother    Alcohol abuse Brother    Social History   Socioeconomic History   Marital status: Married    Spouse name: Sherwood   Number of children: 4   Years of education: Not on file   Highest education level: Bachelor's degree (e.g., BA, AB, BS)  Occupational History   Occupation: retired/homemaker  Tobacco Use   Smoking status: Never   Smokeless tobacco: Never  Vaping Use   Vaping status: Never Used  Substance and Sexual Activity   Alcohol use: Yes    Comment: 1-2 oz weekly   Drug use: No   Sexual activity: Never  Other Topics Concern   Not on file  Social History Narrative   Son lives with her and her husband/2025   Social Drivers of Health   Financial Resource Strain: Low Aguilar  (10/27/2023)   Overall Financial Resource Strain (CARDIA)    Difficulty of Paying Living Expenses: Not hard at all  Food Insecurity: No Food Insecurity (10/27/2023)   Hunger Vital Sign    Worried About Running Out of Food in  the Last Year: Never true    Ran Out of Food in the Last Year: Never true  Transportation Needs: No Transportation Needs (10/27/2023)   PRAPARE - Administrator, Civil Service (Medical): No    Lack of Transportation (Non-Medical): No  Physical Activity: Sufficiently Active (10/27/2023)   Exercise Vital Sign    Days of Exercise per Week: 7 days    Minutes of Exercise per Session: 30 min  Stress: No Stress Concern Present (10/27/2023)   [employer] of Occupational Health - Occupational Stress Questionnaire    Feeling of Stress: Not at all  Social Connections: Socially Integrated (10/27/2023)   Social Connection and Isolation Panel    Frequency of Communication with Friends and Family: More than three times a week  Frequency of Social Gatherings with Friends and Family: More than three times a week    Attends Religious Services: More than 4 times per year    Active Member of [employer] or Organizations: Yes    Attends Engineer, structural: More than 4 times per year    Marital Status: Married    Tobacco Counseling Counseling given: Not Answered    Clinical Intake:  Pre-visit preparation completed: Yes  Pain : No/denies pain     BMI - recorded: 20.8 Nutritional Status: BMI of 19-24  Normal Nutritional Risks: None Diabetes: No  Lab Results  Component Value Date   HGBA1C 5.8 07/01/2023   HGBA1C 5.8 12/31/2022   HGBA1C 6.0 12/24/2021     How often do you need to have someone help you when you read instructions, pamphlets, or other written materials from your doctor or pharmacy?: 1 - Never  Interpreter Needed?: No  Information entered by :: Kataleyah Carducci, RMA   Activities of Daily Living     10/27/2023    1:28 PM 04/22/2023   11:54 AM  In your present state of health, do you have any difficulty performing the following activities:  Hearing? 0 0  Vision? 0 0  Difficulty concentrating or making decisions? 0 0  Walking or climbing stairs? 0    Dressing or bathing? 0   Doing errands, shopping? 0 0  Preparing Food and eating ? N   Using the Toilet? N   In the past six months, have you accidently leaked urine? N   Do you have problems with loss of bowel control? N   Managing your Medications? N   Managing your Finances? N   Housekeeping or managing your Housekeeping? N     Patient Care Team: Purcell Emil Schanz, MD as PCP - General (Internal Medicine) Gorge Ade, MD as Consulting Physician (Obstetrics and Gynecology) Ladona Heinz, MD as Consulting Physician (Cardiology) Octavia Charleston, MD as Referring Physician (Ophthalmology)  I have updated your Care Teams any recent Medical Services you may have received from other providers in the past year.     Assessment:   This is a routine wellness examination for [facility].  Hearing/Vision screen Hearing Screening - Comments:: Denies hearing difficulties   Vision Screening - Comments:: Wears eyeglasses/Dr. Octavia   Goals Addressed             This Visit's Progress    patient   On track    To maintain her health        Depression Screen     10/27/2023    1:46 PM 07/01/2023    8:39 AM 04/30/2023   11:00 AM 12/31/2022    8:35 AM 05/27/2022   10:23 AM 12/24/2021    9:09 AM 12/13/2021    4:29 PM  PHQ 2/9 Scores  PHQ - 2 Score 0 0 0 0 0 0 0  PHQ- 9 Score 0          Fall Aguilar     10/27/2023    1:40 PM 07/01/2023    8:39 AM 04/30/2023   11:00 AM 12/31/2022    8:33 AM 05/27/2022   10:23 AM  Fall Aguilar   Falls in the past year? 0 0 0 0 0  Number falls in past yr: 0 0 0 0 0  Injury with Fall? 0 0 0 0 0  Aguilar for fall due to :  No Fall Risks No Fall Risks No Fall Risks No Fall Risks  Follow  up Falls evaluation completed;Falls prevention discussed Falls evaluation completed Falls evaluation completed Falls evaluation completed Falls evaluation completed    MEDICARE Aguilar AT HOME:  Medicare Aguilar at Home Any stairs in or around the home?: Yes If so, are there any  without handrails?: No Home free of loose throw rugs in walkways, pet beds, electrical cords, etc?: Yes Adequate lighting in your home to reduce Aguilar of falls?: Yes Life alert?: No Use of a cane, walker or w/c?: No Grab bars in the bathroom?: Yes Shower chair or bench in shower?: Yes Elevated toilet seat or a handicapped toilet?: Yes  TIMED UP AND GO:  Was the test performed?  No  Cognitive Function: Declined/Normal: No cognitive concerns noted by patient or family. Patient alert, oriented, able to answer questions appropriately and recall recent events. No signs of memory loss or confusion.    04/12/2016    9:00 AM  MMSE - Mini Mental State Exam  Not completed: --        12/13/2021    4:29 PM 12/20/2019    8:48 AM  6CIT Screen  What Year? 0 points 0 points  What month? 0 points 0 points  What time? 0 points 0 points  Count back from 20 0 points 0 points  Months in reverse 0 points 0 points  Repeat phrase 0 points 0 points  Total Score 0 points 0 points    Immunizations Immunization History  Administered Date(s) Administered   Fluad Quad(high Dose 65+) 12/15/2018, 12/20/2019, 12/21/2020, 12/24/2021   Fluad Trivalent(High Dose 65+) 12/31/2022   Influenza Split 12/19/2011   Influenza,inj,Quad PF,6+ Mos 12/22/2012, 12/21/2013, 12/20/2014, 01/01/2016, 12/05/2016, 12/11/2017   Meningococcal polysaccharide vaccine (MPSV4) 02/14/2009   PFIZER(Purple Top)SARS-COV-2 Vaccination 04/20/2019, 05/11/2019, 01/13/2020   Pneumococcal Conjugate-13 03/15/2014   Pneumococcal Polysaccharide-23 02/14/2009   Tdap 02/03/2007, 12/20/2019   Zoster Recombinant(Shingrix) 05/07/2018, 09/23/2018    Screening Tests Health Maintenance  Topic Date Due   COVID-19 Vaccine (4 - 2024-25 season) 11/26/2023 (Originally 12/01/2022)   INFLUENZA VACCINE  10/31/2023   Medicare Annual Wellness (AWV)  10/26/2024   DTaP/Tdap/Td (3 - Td or Tdap) 12/19/2029   Pneumococcal Vaccine: 50+ Years  Completed   DEXA  SCAN  Completed   Zoster Vaccines- Shingrix  Completed   Hepatitis B Vaccines  Aged Out   HPV VACCINES  Aged Out   Meningococcal B Vaccine  Aged Out    Health Maintenance  There are no preventive care reminders to display for this patient. Health Maintenance Items Addressed: See Nurse Notes at the end of this note  Additional Screening:  Vision Screening: Recommended annual ophthalmology exams for early detection of glaucoma and other disorders of the eye. Would you like a referral to an eye doctor? No    Dental Screening: Recommended annual dental exams for proper oral hygiene  Community Resource Referral / Chronic Care Management: CRR required this visit?  No   CCM required this visit?  No   Plan:    I have personally reviewed and noted the following in the patient's chart:   Medical and social history Use of alcohol, tobacco or illicit drugs  Current medications and supplements including opioid prescriptions. Patient is not currently taking opioid prescriptions. Functional ability and status Nutritional status Physical activity Advanced directives List of other physicians Hospitalizations, surgeries, and ER visits in previous 12 months Vitals Screenings to include cognitive, depression, and falls Referrals and appointments  In addition, I have reviewed and discussed with patient certain preventive protocols, quality  metrics, and best practice recommendations. A written personalized care plan for preventive services as well as general preventive health recommendations were provided to patient.   Elecia Serafin L Jakolby Sedivy, CMA   10/27/2023   After Visit Summary: (MyChart) Due to this being a telephonic visit, the after visit summary with patients personalized plan was offered to patient via MyChart   Notes: Patient is up to date on all health maintenance with no concerns to address today.

## 2023-10-27 NOTE — Patient Instructions (Signed)
 Stephanie Aguilar , Thank you for taking time out of your busy schedule to complete your Annual Wellness Visit with me. I enjoyed our conversation and look forward to speaking with you again next year. I, as well as your care team,  appreciate your ongoing commitment to your health goals. Please review the following plan we discussed and let me know if I can assist you in the future. Your Game plan/ To Do List   Follow up Visits: Next Medicare AWV with our clinical staff: Patient prefers a telephone visit.  10/27/2024.   Have you seen your provider in the last 6 months (3 months if uncontrolled diabetes)? Yes Next Office Visit with your provider: 12/31/2023.  Clinician Recommendations:  Aim for 30 minutes of exercise or brisk walking, 6-8 glasses of water, and 5 servings of fruits and vegetables each day. Keep up the good work.      This is a list of the screening recommended for you and due dates:  Health Maintenance  Topic Date Due   COVID-19 Vaccine (4 - 2024-25 season) 11/26/2023*   Flu Shot  10/31/2023   Medicare Annual Wellness Visit  10/26/2024   DTaP/Tdap/Td vaccine (3 - Td or Tdap) 12/19/2029   Pneumococcal Vaccine for age over 69  Completed   DEXA scan (bone density measurement)  Completed   Zoster (Shingles) Vaccine  Completed   Hepatitis B Vaccine  Aged Out   HPV Vaccine  Aged Out   Meningitis B Vaccine  Aged Out  *Topic was postponed. The date shown is not the original due date.    Advanced directives: (Copy Requested) Please bring a copy of your health care power of attorney and living will to the office to be added to your chart at your convenience. You can mail to [facility] [address] [place], [place] [address] or email to [email or web address] Advance Care Planning is important because it:  [x]  Makes sure you receive the medical care that is consistent with your values, goals, and preferences  [x]  It provides guidance to your family and loved ones  and reduces their decisional burden about whether or not they are making the right decisions based on your wishes.  Follow the link provided in your after visit summary or read over the paperwork we have mailed to you to help you started getting your Advance Directives in place. If you need assistance in completing these, please reach out to us  so that we can help you!  See attachments for Preventive Care and Fall Prevention Tips.

## 2023-12-31 ENCOUNTER — Encounter: Payer: Self-pay | Admitting: Emergency Medicine

## 2023-12-31 ENCOUNTER — Ambulatory Visit: Admitting: Emergency Medicine

## 2023-12-31 VITALS — BP 128/76 | HR 62 | Temp 98.0°F | Ht 59.0 in | Wt 106.2 lb

## 2023-12-31 DIAGNOSIS — N1831 Chronic kidney disease, stage 3a: Secondary | ICD-10-CM

## 2023-12-31 DIAGNOSIS — I739 Peripheral vascular disease, unspecified: Secondary | ICD-10-CM

## 2023-12-31 DIAGNOSIS — I10 Essential (primary) hypertension: Secondary | ICD-10-CM | POA: Diagnosis not present

## 2023-12-31 DIAGNOSIS — E782 Mixed hyperlipidemia: Secondary | ICD-10-CM

## 2023-12-31 DIAGNOSIS — J309 Allergic rhinitis, unspecified: Secondary | ICD-10-CM

## 2023-12-31 DIAGNOSIS — Z23 Encounter for immunization: Secondary | ICD-10-CM | POA: Diagnosis not present

## 2023-12-31 DIAGNOSIS — F411 Generalized anxiety disorder: Secondary | ICD-10-CM

## 2023-12-31 DIAGNOSIS — E785 Hyperlipidemia, unspecified: Secondary | ICD-10-CM

## 2023-12-31 MED ORDER — DESLORATADINE 5 MG PO TABS: 5.0000 mg | ORAL_TABLET | Freq: Every day | ORAL | 3 refills | Status: AC

## 2023-12-31 MED ORDER — LORAZEPAM 0.5 MG PO TABS: 0.5000 mg | ORAL_TABLET | Freq: Two times a day (BID) | ORAL | 1 refills | Status: AC | PRN

## 2023-12-31 MED ORDER — DOTTI 0.025 MG/24HR TD PTTW: 1.0000 | MEDICATED_PATCH | TRANSDERMAL | 3 refills | Status: AC

## 2023-12-31 MED ORDER — AMLODIPINE BESYLATE 2.5 MG PO TABS: ORAL_TABLET | ORAL | 3 refills | Status: AC

## 2023-12-31 MED ORDER — ACEBUTOLOL HCL 200 MG PO CAPS: 200.0000 mg | ORAL_CAPSULE | Freq: Every day | ORAL | 3 refills | Status: AC

## 2023-12-31 MED ORDER — ROSUVASTATIN CALCIUM 5 MG PO TABS: 2.5000 mg | ORAL_TABLET | Freq: Every day | ORAL | 3 refills | Status: AC

## 2023-12-31 NOTE — Assessment & Plan Note (Signed)
 Chronic stable condition. Advised to stay well-hydrated and avoid NSAIDs

## 2023-12-31 NOTE — Assessment & Plan Note (Signed)
 BP Readings from Last 3 Encounters:  12/31/23 128/76  07/01/23 (!) 150/78  04/30/23 134/78  Well-controlled hypertension off olmesartan . Continue amlodipine  2.5 mg daily.  Continue Sectral  200 mg daily. Cardiovascular risks associated with hypertension discussed Diet and nutrition discussed

## 2023-12-31 NOTE — Patient Instructions (Signed)
 Health Maintenance After Age 82 After age 27, you are at a higher risk for certain long-term diseases and infections as well as injuries from falls. Falls are a major cause of broken bones and head injuries in people who are older than age 73. Getting regular preventive care can help to keep you healthy and well. Preventive care includes getting regular testing and making lifestyle changes as recommended by your health care provider. Talk with your health care provider about: Which screenings and tests you should have. A screening is a test that checks for a disease when you have no symptoms. A diet and exercise plan that is right for you. What should I know about screenings and tests to prevent falls? Screening and testing are the best ways to find a health problem early. Early diagnosis and treatment give you the best chance of managing medical conditions that are common after age 90. Certain conditions and lifestyle choices may make you more likely to have a fall. Your health care provider may recommend: Regular vision checks. Poor vision and conditions such as cataracts can make you more likely to have a fall. If you wear glasses, make sure to get your prescription updated if your vision changes. Medicine review. Work with your health care provider to regularly review all of the medicines you are taking, including over-the-counter medicines. Ask your health care provider about any side effects that may make you more likely to have a fall. Tell your health care provider if any medicines that you take make you feel dizzy or sleepy. Strength and balance checks. Your health care provider may recommend certain tests to check your strength and balance while standing, walking, or changing positions. Foot health exam. Foot pain and numbness, as well as not wearing proper footwear, can make you more likely to have a fall. Screenings, including: Osteoporosis screening. Osteoporosis is a condition that causes  the bones to get weaker and break more easily. Blood pressure screening. Blood pressure changes and medicines to control blood pressure can make you feel dizzy. Depression screening. You may be more likely to have a fall if you have a fear of falling, feel depressed, or feel unable to do activities that you used to do. Alcohol  use screening. Using too much alcohol  can affect your balance and may make you more likely to have a fall. Follow these instructions at home: Lifestyle Do not drink alcohol  if: Your health care provider tells you not to drink. If you drink alcohol : Limit how much you have to: 0-1 drink a day for women. 0-2 drinks a day for men. Know how much alcohol  is in your drink. In the [place] one drink equals one 12 oz bottle of beer (355 mL), one 5 oz glass of wine (148 mL), or one 1 oz glass of hard liquor (44 mL). Do not use any products that contain nicotine or tobacco. These products include cigarettes, chewing tobacco, and vaping devices, such as e-cigarettes. If you need help quitting, ask your health care provider. Activity  Follow a regular exercise program to stay fit. This will help you maintain your balance. Ask your health care provider what types of exercise are appropriate for you. If you need a cane or walker, use it as recommended by your health care provider. Wear supportive shoes that have nonskid soles. Safety  Remove any tripping hazards, such as rugs, cords, and clutter. Install safety equipment such as grab bars in bathrooms and safety rails on stairs. Keep rooms and walkways  well-lit. General instructions Talk with your health care provider about your risks for falling. Tell your health care provider if: You fall. Be sure to tell your health care provider about all falls, even ones that seem minor. You feel dizzy, tiredness (fatigue), or off-balance. Take over-the-counter and prescription medicines only as told by your health care provider. These include  supplements. Eat a healthy diet and maintain a healthy weight. A healthy diet includes low-fat dairy products, low-fat (lean) meats, and fiber from whole grains, beans, and lots of fruits and vegetables. Stay current with your vaccines. Schedule regular health, dental, and eye exams. Summary Having a healthy lifestyle and getting preventive care can help to protect your health and wellness after age 15. Screening and testing are the best way to find a health problem early and help you avoid having a fall. Early diagnosis and treatment give you the best chance for managing medical conditions that are more common for people who are older than age 42. Falls are a major cause of broken bones and head injuries in people who are older than age 64. Take precautions to prevent a fall at home. Work with your health care provider to learn what changes you can make to improve your health and wellness and to prevent falls. This information is not intended to replace advice given to you by your health care provider. Make sure you discuss any questions you have with your health care provider. Document Revised: 08/07/2020 Document Reviewed: 08/07/2020 Elsevier Patient Education  2024 [employer].

## 2023-12-31 NOTE — Assessment & Plan Note (Signed)
 Stable chronic condition Continues low-dose rosuvastatin 2.5 mg daily Higher doses gave her musculoskeletal side effects

## 2023-12-31 NOTE — Assessment & Plan Note (Signed)
Stable chronic condition Continues daily baby aspirin

## 2023-12-31 NOTE — Progress Notes (Signed)
 Stephanie Aguilar 83 y.o.   No chief complaint on file.   HISTORY OF PRESENT ILLNESS: This is a 83 y.o. female A1A here for follow-up of multiple chronic medical conditions Overall doing well.  Has no complaints or medical concerns today.  HPI   Prior to Admission medications   Medication Sig Start Date End Date Taking? Authorizing Provider  acebutolol  (SECTRAL ) 200 MG capsule Take 1 capsule (200 mg total) by mouth daily. 12/31/22 12/31/23 Yes Silvester Reierson, Emil Schanz, MD  amLODipine  (NORVASC ) 2.5 MG tablet TAKE 1 TABLET EACH DAY. 12/31/22  Yes SagardiaEmil Schanz, MD  aspirin  81 MG tablet Take 81 mg by mouth daily.   Yes [provider]  carboxymethylcellulose (REFRESH PLUS) 0.5 % SOLN Place 2 drops into both eyes 3 (three) times daily as needed.   Yes [provider]  Cholecalciferol (VITAMIN D ) 2000 units CAPS Take 1 capsule by mouth daily.   Yes [provider]  clobetasol  (TEMOVATE ) 0.05 % external solution Apply 10 drops to scalp every night at bedtime. 04/11/15  Yes Humberto Elspeth LABOR, MD  cycloSPORINE (RESTASIS) 0.05 % ophthalmic emulsion 1 drop 2 (two) times daily.   Yes [provider]  desloratadine  (CLARINEX ) 5 MG tablet Take 1 tablet (5 mg total) by mouth daily. as directed 12/31/22  Yes Latrel Szymczak, Emil Schanz, MD  desloratadine  (CLARINEX ) 5 MG tablet Take 5 mg by mouth daily.   Yes [provider]  DOTTI 0.025 MG/24HR Place 1 patch onto the skin 2 (two) times a week. 04/14/23  Yes [provider]  fluticasone  (FLONASE ) 50 MCG/ACT nasal spray USE 2 SPRAYS INTO EACH NOSTRIL DAILY 09/20/14  Yes Daub, Elspeth LABOR, MD  LORazepam  (ATIVAN ) 0.5 MG tablet Take 1 tablet (0.5 mg total) by mouth 2 (two) times daily as needed for anxiety. 07/01/23  Yes Sheriff Rodenberg, Emil Schanz, MD  meclizine  (ANTIVERT ) 12.5 MG tablet Take 1 tablet (12.5 mg total) by mouth 3 (three) times daily as needed for dizziness. 04/30/23  Yes Leiyah Maultsby, Emil Schanz, MD   METRONIDAZOLE , TOPICAL, 0.75 % LOTN APPLY TOPICALLY TO AFFECTED AREA. 09/21/15  Yes Daub, Elspeth LABOR, MD  ondansetron  (ZOFRAN -ODT) 4 MG disintegrating tablet Take 1 tablet (4 mg total) by mouth every 8 (eight) hours as needed for nausea or vomiting. 04/21/23  Yes Franklyn Sid SAILOR, MD  OVER THE COUNTER MEDICATION OTC Imodium taking daily for diarrrhea   Yes [provider]  rosuvastatin  (CRESTOR ) 5 MG tablet Take 0.5 tablets (2.5 mg total) by mouth daily. 12/31/22 12/31/23 Yes Siennah Barrasso, Emil Schanz, MD  olmesartan  (BENICAR ) 40 MG tablet Take 1 tablet (40 mg total) by mouth daily. Patient not taking: Reported on 12/31/2023 12/31/22   Purcell Emil Schanz, MD    Allergies  Allergen Reactions   Lactose Intolerance (Gi) Diarrhea   Codeine Nausea Only   Erythromycin Nausea Only    Sick on stomach   Lisinopril Cough   Sulfa Antibiotics     rash   Latex Rash    Patient Active Problem List   Diagnosis Date Noted   CKD (chronic kidney disease), stage III (HCC) 04/13/2016   PAD (peripheral artery disease) 04/12/2016   Anxiety disorder 11/07/2015   Carotid bruit 07/06/2013   Essential hypertension 02/04/2012   Dyslipidemia 02/04/2012   Menopausal syndrome on hormone replacement therapy 02/04/2012    Past Medical History:  Diagnosis Date   Allergy    Anxiety    Cataract    Depression    Hyperlipidemia  Hypertension     Past Surgical History:  Procedure Laterality Date   ABDOMINAL HYSTERECTOMY     APPENDECTOMY     BREAST SURGERY     DENTAL SURGERY     TONSILLECTOMY AND ADENOIDECTOMY      Social History   Socioeconomic History   Marital status: Married    Spouse name: Sherwood   Number of children: 4   Years of education: Not on file   Highest education level: Bachelor's degree (e.g., BA, AB, BS)  Occupational History   Occupation: retired/homemaker  Tobacco Use   Smoking status: Never   Smokeless tobacco: Never  Vaping Use   Vaping status: Never Used  Substance  and Sexual Activity   Alcohol use: Yes    Comment: 1-2 oz weekly   Drug use: No   Sexual activity: Never  Other Topics Concern   Not on file  Social History Narrative   Son lives with her and her husband/2025   Social Drivers of Health   Financial Resource Strain: Low Risk  (12/27/2023)   Overall Financial Resource Strain (CARDIA)    Difficulty of Paying Living Expenses: Not hard at all  Food Insecurity: No Food Insecurity (12/27/2023)   Hunger Vital Sign    Worried About Running Out of Food in the Last Year: Never true    Ran Out of Food in the Last Year: Never true  Transportation Needs: No Transportation Needs (12/27/2023)   PRAPARE - Administrator, Civil Service (Medical): No    Lack of Transportation (Non-Medical): No  Physical Activity: Sufficiently Active (12/27/2023)   Exercise Vital Sign    Days of Exercise per Week: 7 days    Minutes of Exercise per Session: 60 min  Stress: Stress Concern Present (12/27/2023)   [employer] of Occupational Health - Occupational Stress Questionnaire    Feeling of Stress: To some extent  Social Connections: Unknown (12/27/2023)   Social Connection and Isolation Panel    Frequency of Communication with Friends and Family: More than three times a week    Frequency of Social Gatherings with Friends and Family: More than three times a week    Attends Religious Services: More than 4 times per year    Active Member of [employer] or Organizations: Not on file    Attends Banker Meetings: Not on file    Marital Status: Married  Intimate Partner Violence: Not At Risk (10/27/2023)   Humiliation, Afraid, Rape, and Kick questionnaire    Fear of Current or Ex-Partner: No    Emotionally Abused: No    Physically Abused: No    Sexually Abused: No    Family History  Problem Relation Age of Onset   Hypertension Mother    Alcohol abuse Father    COPD Brother    Hypertension Brother    Alcohol abuse Brother    Heart disease  Maternal Grandmother    Cancer Maternal Grandfather    Stroke Paternal Grandmother    Gout Brother    Alcohol abuse Brother      Review of Systems  Constitutional: Negative.  Negative for chills and fever.  HENT: Negative.  Negative for congestion and sore throat.   Respiratory: Negative.  Negative for cough and shortness of breath.   Cardiovascular: Negative.  Negative for chest pain and palpitations.  Gastrointestinal:  Negative for abdominal pain, diarrhea, nausea and vomiting.  Genitourinary: Negative.  Negative for dysuria and hematuria.  Musculoskeletal:  Positive for joint pain.  Skin: Negative.  Negative for rash.  Neurological: Negative.  Negative for dizziness and headaches.  All other systems reviewed and are negative.   Vitals:   12/31/23 0838  BP: 128/76  Pulse: 62  Temp: 98 F (36.7 C)  SpO2: 98%    Physical Exam Vitals reviewed.  Constitutional:      Appearance: Normal appearance.  HENT:     Head: Normocephalic.     Mouth/Throat:     Mouth: Mucous membranes are moist.     Pharynx: Oropharynx is clear.  Eyes:     Extraocular Movements: Extraocular movements intact.     Conjunctiva/sclera: Conjunctivae normal.     Pupils: Pupils are equal, round, and reactive to light.  Cardiovascular:     Rate and Rhythm: Normal rate and regular rhythm.     Pulses: Normal pulses.     Heart sounds: Normal heart sounds.  Pulmonary:     Effort: Pulmonary effort is normal.     Breath sounds: Normal breath sounds.  Abdominal:     Palpations: Abdomen is soft.     Tenderness: There is no abdominal tenderness.  Musculoskeletal:     Cervical back: No tenderness.  Lymphadenopathy:     Cervical: No cervical adenopathy.  Skin:    General: Skin is warm and dry.     Capillary Refill: Capillary refill takes less than 2 seconds.  Neurological:     General: No focal deficit present.     Mental Status: She is alert and oriented to person, place, and time.  Psychiatric:         Mood and Affect: Mood normal.        Behavior: Behavior normal.      ASSESSMENT & PLAN: A total of 44 minutes was spent with the patient and counseling/coordination of care regarding preparing for this visit, review of most recent office visit notes, review of multiple chronic medical conditions and their management, review of all medications, review of most recent bloodwork results, review of health maintenance items, education on nutrition, prognosis, documentation, and need for follow up.   Problem List Items Addressed This Visit       Cardiovascular and Mediastinum   Essential hypertension - Primary   BP Readings from Last 3 Encounters:  12/31/23 128/76  07/01/23 (!) 150/78  04/30/23 134/78  Well-controlled hypertension off olmesartan . Continue amlodipine  2.5 mg daily.  Continue Sectral  200 mg daily. Cardiovascular risks associated with hypertension discussed Diet and nutrition discussed       Relevant Medications   acebutolol  (SECTRAL ) 200 MG capsule   amLODipine  (NORVASC ) 2.5 MG tablet   rosuvastatin  (CRESTOR ) 5 MG tablet   PAD (peripheral artery disease)   Stable chronic condition Continues daily baby aspirin       Relevant Medications   acebutolol  (SECTRAL ) 200 MG capsule   amLODipine  (NORVASC ) 2.5 MG tablet   rosuvastatin  (CRESTOR ) 5 MG tablet     Genitourinary   CKD (chronic kidney disease) stage 3, GFR 30-59 ml/min (HCC)   Chronic stable condition Advised to stay well-hydrated and avoid NSAIDs        Other   Dyslipidemia   Stable chronic condition Continues low-dose rosuvastatin  2.5 mg daily Higher doses gave her musculoskeletal side effects      Relevant Medications   rosuvastatin  (CRESTOR ) 5 MG tablet   Anxiety disorder   Relevant Medications   LORazepam  (ATIVAN ) 0.5 MG tablet   Other Visit Diagnoses       Essential hypertension, benign  Relevant Medications   acebutolol  (SECTRAL ) 200 MG capsule   amLODipine  (NORVASC ) 2.5 MG tablet    rosuvastatin  (CRESTOR ) 5 MG tablet     Allergic rhinitis, unspecified seasonality, unspecified trigger       Relevant Medications   desloratadine  (CLARINEX ) 5 MG tablet     Mixed hyperlipidemia       Relevant Medications   acebutolol  (SECTRAL ) 200 MG capsule   amLODipine  (NORVASC ) 2.5 MG tablet   rosuvastatin  (CRESTOR ) 5 MG tablet     Need for vaccination       Relevant Orders   Flu vaccine HIGH DOSE PF(Fluzone Trivalent) (Completed)      Patient Instructions  Health Maintenance After Age 70 After age 19, you are at a higher risk for certain long-term diseases and infections as well as injuries from falls. Falls are a major cause of broken bones and head injuries in people who are older than age 57. Getting regular preventive care can help to keep you healthy and well. Preventive care includes getting regular testing and making lifestyle changes as recommended by your health care provider. Talk with your health care provider about: Which screenings and tests you should have. A screening is a test that checks for a disease when you have no symptoms. A diet and exercise plan that is right for you. What should I know about screenings and tests to prevent falls? Screening and testing are the best ways to find a health problem early. Early diagnosis and treatment give you the best chance of managing medical conditions that are common after age 28. Certain conditions and lifestyle choices may make you more likely to have a fall. Your health care provider may recommend: Regular vision checks. Poor vision and conditions such as cataracts can make you more likely to have a fall. If you wear glasses, make sure to get your prescription updated if your vision changes. Medicine review. Work with your health care provider to regularly review all of the medicines you are taking, including over-the-counter medicines. Ask your health care provider about any side effects that may make you more likely to have  a fall. Tell your health care provider if any medicines that you take make you feel dizzy or sleepy. Strength and balance checks. Your health care provider may recommend certain tests to check your strength and balance while standing, walking, or changing positions. Foot health exam. Foot pain and numbness, as well as not wearing proper footwear, can make you more likely to have a fall. Screenings, including: Osteoporosis screening. Osteoporosis is a condition that causes the bones to get weaker and break more easily. Blood pressure screening. Blood pressure changes and medicines to control blood pressure can make you feel dizzy. Depression screening. You may be more likely to have a fall if you have a fear of falling, feel depressed, or feel unable to do activities that you used to do. Alcohol use screening. Using too much alcohol can affect your balance and may make you more likely to have a fall. Follow these instructions at home: Lifestyle Do not drink alcohol if: Your health care provider tells you not to drink. If you drink alcohol: Limit how much you have to: 0-1 drink a day for women. 0-2 drinks a day for men. Know how much alcohol is in your drink. In the [place] one drink equals one 12 oz bottle of beer (355 mL), one 5 oz glass of wine (148 mL), or one 1 oz glass of hard liquor (44  mL). Do not use any products that contain nicotine or tobacco. These products include cigarettes, chewing tobacco, and vaping devices, such as e-cigarettes. If you need help quitting, ask your health care provider. Activity  Follow a regular exercise program to stay fit. This will help you maintain your balance. Ask your health care provider what types of exercise are appropriate for you. If you need a cane or walker, use it as recommended by your health care provider. Wear supportive shoes that have nonskid soles. Safety  Remove any tripping hazards, such as rugs, cords, and clutter. Install safety  equipment such as grab bars in bathrooms and safety rails on stairs. Keep rooms and walkways well-lit. General instructions Talk with your health care provider about your risks for falling. Tell your health care provider if: You fall. Be sure to tell your health care provider about all falls, even ones that seem minor. You feel dizzy, tiredness (fatigue), or off-balance. Take over-the-counter and prescription medicines only as told by your health care provider. These include supplements. Eat a healthy diet and maintain a healthy weight. A healthy diet includes low-fat dairy products, low-fat (lean) meats, and fiber from whole grains, beans, and lots of fruits and vegetables. Stay current with your vaccines. Schedule regular health, dental, and eye exams. Summary Having a healthy lifestyle and getting preventive care can help to protect your health and wellness after age 26. Screening and testing are the best way to find a health problem early and help you avoid having a fall. Early diagnosis and treatment give you the best chance for managing medical conditions that are more common for people who are older than age 51. Falls are a major cause of broken bones and head injuries in people who are older than age 35. Take precautions to prevent a fall at home. Work with your health care provider to learn what changes you can make to improve your health and wellness and to prevent falls. This information is not intended to replace advice given to you by your health care provider. Make sure you discuss any questions you have with your health care provider. Document Revised: 08/07/2020 Document Reviewed: 08/07/2020 Elsevier Patient Education  2024 [employer]     Emil Schaumann, MD [facility]

## 2024-03-08 LAB — HM MAMMOGRAPHY

## 2024-06-30 ENCOUNTER — Ambulatory Visit: Admitting: Emergency Medicine

## 2024-10-27 ENCOUNTER — Ambulatory Visit
